# Patient Record
Sex: Male | Born: 1935 | Race: White | Hispanic: No | Marital: Married | Smoking: Never smoker
Health system: Southern US, Community
[De-identification: ages and names within clinical notes are randomized; demographics above are authoritative.]

## PROBLEM LIST (undated history)

## (undated) DIAGNOSIS — M810 Age-related osteoporosis without current pathological fracture: Secondary | ICD-10-CM

## (undated) DIAGNOSIS — D509 Iron deficiency anemia, unspecified: Secondary | ICD-10-CM

## (undated) DIAGNOSIS — H409 Unspecified glaucoma: Secondary | ICD-10-CM

## (undated) DIAGNOSIS — K219 Gastro-esophageal reflux disease without esophagitis: Secondary | ICD-10-CM

## (undated) DIAGNOSIS — R413 Other amnesia: Secondary | ICD-10-CM

## (undated) DIAGNOSIS — E785 Hyperlipidemia, unspecified: Secondary | ICD-10-CM

## (undated) DIAGNOSIS — I2510 Atherosclerotic heart disease of native coronary artery without angina pectoris: Secondary | ICD-10-CM

## (undated) HISTORY — DX: Unspecified glaucoma: H40.9

## (undated) HISTORY — DX: Atherosclerotic heart disease of native coronary artery without angina pectoris: I25.10

## (undated) HISTORY — DX: Age-related osteoporosis without current pathological fracture: M81.0

## (undated) HISTORY — PX: ANTERIOR CRUCIATE LIGAMENT REPAIR: SHX115

## (undated) HISTORY — DX: Other amnesia: R41.3

## (undated) HISTORY — DX: Iron deficiency anemia, unspecified: D50.9

## (undated) HISTORY — DX: Gastro-esophageal reflux disease without esophagitis: K21.9

## (undated) HISTORY — PX: OTHER SURGICAL HISTORY: SHX169

## (undated) HISTORY — DX: Hyperlipidemia, unspecified: E78.5

---

## 2001-02-27 ENCOUNTER — Encounter: Payer: Self-pay | Admitting: Emergency Medicine

## 2001-02-27 ENCOUNTER — Inpatient Hospital Stay (HOSPITAL_COMMUNITY): Admission: AC | Admit: 2001-02-27 | Discharge: 2001-02-28 | Payer: Self-pay

## 2001-02-28 ENCOUNTER — Encounter: Payer: Self-pay | Admitting: General Surgery

## 2004-08-08 ENCOUNTER — Emergency Department: Payer: Self-pay | Admitting: Emergency Medicine

## 2004-11-21 ENCOUNTER — Ambulatory Visit: Payer: Self-pay | Admitting: Internal Medicine

## 2004-12-03 ENCOUNTER — Ambulatory Visit: Payer: Self-pay | Admitting: Internal Medicine

## 2004-12-09 ENCOUNTER — Ambulatory Visit: Payer: Self-pay | Admitting: Cardiology

## 2005-01-02 ENCOUNTER — Ambulatory Visit: Payer: Self-pay | Admitting: Internal Medicine

## 2005-12-21 ENCOUNTER — Ambulatory Visit: Payer: Self-pay | Admitting: Cardiology

## 2006-01-30 ENCOUNTER — Ambulatory Visit: Payer: Self-pay

## 2006-12-06 ENCOUNTER — Ambulatory Visit: Payer: Self-pay | Admitting: Cardiology

## 2006-12-12 ENCOUNTER — Ambulatory Visit: Payer: Self-pay

## 2007-01-30 ENCOUNTER — Ambulatory Visit: Payer: Self-pay | Admitting: Gastroenterology

## 2007-05-30 ENCOUNTER — Ambulatory Visit: Payer: Self-pay | Admitting: Ophthalmology

## 2007-06-12 ENCOUNTER — Ambulatory Visit: Payer: Self-pay | Admitting: Ophthalmology

## 2007-10-02 ENCOUNTER — Ambulatory Visit: Payer: Self-pay | Admitting: Unknown Physician Specialty

## 2008-02-21 ENCOUNTER — Ambulatory Visit: Payer: Self-pay | Admitting: Cardiology

## 2008-03-23 ENCOUNTER — Ambulatory Visit: Payer: Self-pay | Admitting: Internal Medicine

## 2008-03-30 ENCOUNTER — Ambulatory Visit: Payer: Self-pay

## 2008-04-04 ENCOUNTER — Ambulatory Visit: Payer: Self-pay | Admitting: Internal Medicine

## 2008-06-04 ENCOUNTER — Ambulatory Visit: Payer: Self-pay | Admitting: Internal Medicine

## 2008-06-18 ENCOUNTER — Ambulatory Visit: Payer: Self-pay | Admitting: Unknown Physician Specialty

## 2008-06-25 ENCOUNTER — Inpatient Hospital Stay: Payer: Self-pay | Admitting: Unknown Physician Specialty

## 2009-03-04 ENCOUNTER — Encounter: Payer: Self-pay | Admitting: Internal Medicine

## 2009-03-25 ENCOUNTER — Ambulatory Visit: Payer: Self-pay | Admitting: Internal Medicine

## 2009-03-25 DIAGNOSIS — E785 Hyperlipidemia, unspecified: Secondary | ICD-10-CM | POA: Insufficient documentation

## 2009-03-25 DIAGNOSIS — I2510 Atherosclerotic heart disease of native coronary artery without angina pectoris: Secondary | ICD-10-CM

## 2009-03-25 DIAGNOSIS — R072 Precordial pain: Secondary | ICD-10-CM | POA: Insufficient documentation

## 2009-04-14 ENCOUNTER — Encounter: Payer: Self-pay | Admitting: Physician Assistant

## 2009-04-14 ENCOUNTER — Ambulatory Visit: Payer: Self-pay | Admitting: Internal Medicine

## 2009-09-16 ENCOUNTER — Ambulatory Visit: Payer: Self-pay | Admitting: Internal Medicine

## 2009-11-10 ENCOUNTER — Encounter: Payer: Self-pay | Admitting: Internal Medicine

## 2009-12-20 ENCOUNTER — Telehealth: Payer: Self-pay | Admitting: Internal Medicine

## 2010-04-11 ENCOUNTER — Ambulatory Visit: Payer: Self-pay | Admitting: Unknown Physician Specialty

## 2010-09-23 ENCOUNTER — Encounter: Payer: Self-pay | Admitting: Internal Medicine

## 2010-09-23 ENCOUNTER — Ambulatory Visit
Admission: RE | Admit: 2010-09-23 | Discharge: 2010-09-23 | Payer: Self-pay | Source: Home / Self Care | Attending: Internal Medicine | Admitting: Internal Medicine

## 2010-10-04 NOTE — Assessment & Plan Note (Signed)
Summary: F6M/AMD   Visit Type:  Follow-up Primary Provider:  Bethann Punches  CC:  no complaints and cough at bedtime.  History of Present Illness: [name] is a very pleasant 77 year old male with a 20-year history of diabetes as well as hyperlipidemia.  He had a cardiac catheterization in 2000, which showed moderate nonobstructive coronary disease with a 50% LAD lesion and a 60% lesion in his right coronary artery.  He has been managed medically.  His most recent Myoview was in April 2008 showed normal LV function, EF of 57% with no ischemia or infarction.   Was having some CP at the last visit and underwent ETT in 8/10. Walked 9:17 on [place] with no CP or ST-T changes.  Returns for f/u. Walking 2 miles every morning and swims half mile in p almost every day. Also doing some weights. Preparing for senior games in April. No CP or SOB. No problems with meds.  Lipids followed by Dr. Hyacinth Meeker.    Medications Prior to Update: 1)  Simvastatin 40 Mg Tabs (Simvastatin) .... Take One Tablet By Mouth Daily At Bedtime 2)  Ramipril 5 Mg Caps (Ramipril) .... Take One Capsule By Mouth Daily 3)  Glimepiride 1 Mg Tabs (Glimepiride) .... Take 1 By Mouth Once Daily 4)  Omeprazole 20 Mg Cpdr (Omeprazole) .... Take 1 By Mouth Once Daily 5)  Avodart 0.5 Mg Caps (Dutasteride) .... Take 1 By Mouth Once Daily 6)  Metformin Hcl 500 Mg Tabs (Metformin Hcl) .... Take 3 By Mouth Two Times A Day 7)  Aspirin 81 Mg Tbec (Aspirin) .... Take One Tablet By Mouth Daily 8)  Glucosamine Chondroitin Complx  Caps (Glucosamine-Chondroit-Biofl-Mn) .... Take 1 By Mouth Once Daily  Current Medications (verified): 1)  Simvastatin 40 Mg Tabs (Simvastatin) .... Take One Tablet By Mouth Daily At Bedtime 2)  Ramipril 5 Mg Caps (Ramipril) .... Take One Capsule By Mouth Daily 3)  Glimepiride 1 Mg Tabs (Glimepiride) .... Take 1 By Mouth Once Daily 4)  Omeprazole 20 Mg Cpdr (Omeprazole) .... Take 1 By Mouth Once Daily 5)  Metformin Hcl  500 Mg Tabs (Metformin Hcl) .... Take 3 By Mouth Two Times A Day 6)  Aspirin 81 Mg Tbec (Aspirin) .... Take One Tablet By Mouth Daily 7)  Glucosamine Chondroitin Complx  Caps (Glucosamine-Chondroit-Biofl-Mn) .... Take 1 By Mouth Once Daily 8)  Tamsulosin Hcl 0.4 Mg Caps (Tamsulosin Hcl) .[name].. 1 By Mouth Once Daily  Allergies (verified): No Known Drug Allergies  Past History:  Past Medical History: CAD, non-obsturctive by cath in 2000    --Myoview in 2008 negative. EF 57%    --ETT in 8/10. Walked 9:17 on [place] with no CP or ST-T changes. HYPERLIPIDEMIA DIABETES X 20 YEARS GERD IRON DEFICIENCY ANEMIA  Review of Systems       As per HPI and past medical history; otherwise all systems negative.   Vital Signs:  Patient profile:   77 year old male Height:      68 inches Weight:      178.50 pounds BMI:     27.24 Pulse rate:   70 / minute Pulse rhythm:   regular BP sitting:   118 / 62  (left arm) Cuff size:   regular  Vitals Entered By: Mercer Pod (September 16, 2009 4:20 PM)  Physical Exam  General:  Gen: well appearing. no resp difficulty. younger than stated age HEENT: normal Neck: supple. no JVD. Carotids 2+ bilat; not bruits. No lymphadenopathy or thryomegaly appreciated. Cor: PMI  nondisplaced. Regular rate & rhythm. No rubs, murmur. +s4 Lungs: clear Abdomen: soft, nontender, nondistended. No hepatosplenomegaly. Good bowel sounds. Extremities: no cyanosis, clubbing, rash, edema Neuro: alert & orientedx3, cranial nerves grossly intact. moves all 4 extremities w/o difficulty. affect pleasant    Impression & Recommendations:  Problem # 1:  CAD, NATIVE VESSEL (ICD-414.01) Stable. No evidence of ischemia. Stress test reassuring. Continue current regimen.  Problem # 2:  HYPERLIPIDEMIA-MIXED (ICD-272.4) Folloed by Dr. Hyacinth Meeker. Goal LDL < 70. Continue statin.   Patient Instructions: 1)  Return to clinic 1 year.

## 2010-10-04 NOTE — Progress Notes (Signed)
Summary: TRIAL MEDS  Phone Note Call from Patient Call back at Home Phone [phone]   Caller: SELF Call For: [name] [name] Summary of Call: DR MARK MILLER  WANTS TO START PT ON A TRIAL FOR DIABETES WHICH WILL INCLUDE LIRAGLUTIDE OR PLACEBO-WOULD IT BE OK FOR HIM TAKE THESE MEDS THROUGH A SHOT ONCE A WEEK? Initial call taken by: Harlon Flor,  December 20, 2009 8:33 AM  Follow-up for Phone Call        I'm ok with it. Dolores Patty, MD, [facility]  December 20, 2009 4:31 PM   Additional Follow-up for Phone Call Additional follow up Details #1::        pt aware. Charlena Cross RN BSN

## 2010-10-12 NOTE — Assessment & Plan Note (Signed)
Summary: F1Y/AMD   Visit Type:  Follow-up Primary Provider:  Bethann [name]  CC:  "Doing great".  History of Present Illness: [name] [name] is a very pleasant 77 year old male with a 20-year history of diabetes as well as hyperlipidemia.  He had a cardiac catheterization in 2000, which showed moderate nonobstructive coronary disease with a 50% LAD lesion and a 60% lesion in his right coronary artery.  He has been managed medically.  His most recent Myoview was in April 2008 showed normal LV function, EF of 57% with no ischemia or infarction.   Underwent ETT in 8/10 for CP. Walked 9:17 on [place] with no CP or ST-T changes.  Returns for 1 year. Feels great  Walking 2-3 miles every morning and swims half mile almost every day. Also doing some weights. Won 5 swimmming events in senior games in last year. No CP or SOB. No problems with meds.   Lipids and diabetes followed by Dr. Hyacinth Meeker. HgbA1c  ~7.    Current Medications (verified): 1)  Simvastatin 40 Mg Tabs (Simvastatin) .... Take One Tablet By Mouth Daily At Bedtime 2)  Ramipril 5 Mg Caps (Ramipril) .... Take One Capsule By Mouth Daily 3)  Glimepiride 1 Mg Tabs (Glimepiride) .... Take 1 By Mouth Once Daily 4)  Omeprazole 20 Mg Cpdr (Omeprazole) .... Take 1 By Mouth Once Daily 5)  Metformin Hcl 500 Mg Tabs (Metformin Hcl) .... Take 3 By Mouth Two Times A Day 6)  Aspirin 81 Mg Tbec (Aspirin) .... Take One Tablet By Mouth Daily 7)  Glucosamine Chondroitin Complx  Caps (Glucosamine-Chondroit-Biofl-Mn) .... Take 1 By Mouth Once Daily 8)  Tamsulosin Hcl 0.4 Mg Caps (Tamsulosin Hcl) .[name].. 1 By Mouth Once Daily  Allergies (verified): No Known Drug Allergies  Past History:  Past Medical History: Last updated: 09/16/2009 CAD, non-obsturctive by cath in 2000    --Myoview in 2008 negative. EF 57%    --ETT in 8/10. Walked 9:17 on [place] with no CP or ST-T changes. HYPERLIPIDEMIA DIABETES X 20 YEARS GERD IRON DEFICIENCY ANEMIA  Past Surgical  History: Last updated: 07-Apr-2009 REMOVAL R EYE R KNEE SURGERY COLON POLYPS REMOVED X 2 CATH   Family History: Last updated: Apr 07, 2009 FATHER: DECEASED 87; HEART ATTACK MOTHER: DECEASED 91; STROKE MGM: DECEASED 82; OLD AGE MGF: DECEASED PGF: DECEASED 55; HEART ATTACK PGM: DECEASED  BROTHER: AGE 107; HEART DISEASE BYPASS SISTER: LIVING; ARTHRITIS  Social History: Last updated: 04/07/2009 Retired  Married  Tobacco Use - No.  Alcohol Use - yes  Risk Factors: Smoking Status: never (04-07-09)  Review of Systems       As per HPI and past medical history; otherwise all systems negative.   Vital Signs:  Patient profile:   77 year old male Height:      68 inches Weight:      173 pounds BMI:     26.40 Pulse rate:   65 / minute BP sitting:   120 / 70  (left arm) Cuff size:   regular  Vitals Entered By: [name] [name], CMA (September 23, 2010 4:04 PM)  Physical Exam  General:  well appearing. no resp difficulty. younger than stated age HEENT: normal Neck: supple. no JVD. Carotids 2+ bilat; not bruits. No lymphadenopathy or thryomegaly appreciated. Cor: PMI nondisplaced. Regular rate & rhythm. No rubs, murmur. +s4 Lungs: clear Abdomen: soft, nontender, nondistended. No hepatosplenomegaly. Good bowel sounds. Extremities: no cyanosis, clubbing, rash, edema Neuro: alert & orientedx3, cranial nerves grossly intact. moves all 4 extremities w/o difficulty.  affect pleasant    Impression & Recommendations:  Problem # 1:  CAD, NATIVE VESSEL (ICD-414.01) Mild non-obstructive. No evidence of angina. Continue risk factor management.

## 2011-01-17 NOTE — Assessment & Plan Note (Signed)
[facility] OFFICE NOTE   NAME:[name] [name]                        MRN:          [id number]  DATE:06/04/2008                            DOB:          01/02/1934    PRIMARY CARE PHYSICIAN:  Dr. Bethann Punches.   ORTHOPEDIST:  Randon Goldsmith, MD   REASON FOR CONSULTATION:  Preop cardiac evaluation prior to partial  right knee replacement.   HISTORY OF PRESENT ILLNESS:  [name] is a very pleasant 77 year old  male with a 20-year history of diabetes as well as hyperlipidemia.  He  had a cardiac catheterization in 2000, which showed moderate  nonobstructive coronary disease with a 50% LAD lesion and a 60% lesion  in his right coronary artery.  He has been managed medically.  His most  recent Myoview was in April 2008 showed normal LV function, EF of 57%  with no ischemia or infarction.   Functionally, he has been very active.  He goes to senior games and  participates in about 15 events including swimming the 5K and a host of  track and field events.  He has never had any chest pain or significant  dyspnea with this.  He has been swimming half a mile a day and walking 3  miles a day in training.  Unfortunately, at the [facility] games about 3  weeks ago, he hurt his right knee.  He went to see Dr. Gavin Potters and told  he may need a partial right knee replacement.  He comes here for preop  evaluation.   REVIEW OF SYSTEMS:  He denies any chest pain, no dizziness, no  palpitations, no heart failure symptoms.  He does have some arthritic  pain.  He has not had any bleeding.  Remainder of review of systems is  negative except for HPI and problem list.   PROBLEM LIST:  1. Coronary artery disease, nonobstructive by catheterization in 2000.  2. Hyperlipidemia.  3. Diabetes x20 years.  4. Gastroesophageal reflux disease.  5. Iron deficiency anemia.   CURRENT MEDICATIONS:  1. Aspirin 81 a day.  2. Avodart 0.5 a  week.  3. Simvastatin 40 a day.  4. Altace 5 a day.  5. Omeprazole 20 a day.  6. Glucosamine/chondroitin.  7. Metformin 1500 mg b.i.d.   ALLERGIES:  No known allergies.   SOCIAL HISTORY:  He is married.  He has never smoked.  He is retired.  Occasional wine.   FAMILY HISTORY:  Negative for coronary artery disease.   PHYSICAL EXAMINATION:  GENERAL:  He is in no acute distress.  Ambulates  around the clinic without any respiratory difficulty.  He does have  significant kyphosis.  VITAL SIGNS:  Blood pressure is 130/60, heart rate 60, weight is 164.  HEENT:  Normal.  NECK:  Supple.  There is no JVD.  Carotids are 2+ bilaterally without  any bruits.  There is no lymphadenopathy or thyromegaly.  HEART:  PMI is nondisplaced.  Regular rate and rhythm.  No murmurs,  rubs, or gallops.  LUNGS:  Clear.  ABDOMEN:  Soft, nontender, nondistended.  No hepatosplenomegaly.  No  bruits.  No masses.  Good bowel sounds.  EXTREMITIES:  Warm with no cyanosis, clubbing, or edema.  He does have  mild effusion around his right knee.  NEUROLOGIC:  Alert and oriented x3.  Cranial nerves II through XII are  intact.  Moves all 4 extremities without difficulty.  Affect is  pleasant.   EKG shows sinus bradycardia at a rate of 56.  No ST-T wave  abnormalities.   ASSESSMENT AND PLAN:  Given Mr. Top's excellent functional capacity  and normal Myoview, he is at very low-risk for perioperative cardiac  complications.  I would suggest proceeding to surgery without any  further cardiac workup.  We will be happy to assist in case there are  any unexpected perioperative cardiac issues.  We appreciate the consult.  Please do not hesitate to call with questions.     Bevelyn Buckles. Bensimhon, MD  Electronically Signed    DRB/MedQ  DD: 06/04/2008  DT: 06/05/2008  Job #: [id number]   cc:   Kelvin Cellar, MD

## 2011-01-17 NOTE — Assessment & Plan Note (Signed)
[facility] OFFICE NOTE   NAME:Ziegler, MOHSIN CRUM                        MRN:          [id number]  DATE:02/21/2008                            DOB:          1934-08-09    Mr. Calleros is a very pleasant gentleman who has a history of moderate  coronary disease by previous catheterization.  This was performed in  2000, and he was found to have 50% LAD as well as 60% right coronary  artery.  He has been managed medically since then.  His most recent  Myoview was performed on December 12, 2006, and showed normal LV function  with an ejection fraction of 57%, and there was no ischemia or  infarction.  His last echo in June 2003, showed normal LV function and  mild tricuspid regurgitation.  He has also had a previous abdominal  ultrasound that showed no aneurysm in May 2007 and carotid Dopplers in  May 2007, that showed no obstructive disease.  Since I last saw him, he  denies any dyspnea.  Approximately 1-2 weeks ago, he had a brief episode  of chest pain that lasted for 30 seconds.  It did not radiate.  It was  not pleuritic or positional.  He thinks it may have been gas.  He  described it as a sharp pain.  Note, he swims and exercises routinely  without any chest pain.   MEDICATIONS:  1. Aspirin 81 mg p.o. daily.  2. Avodart.  3. Zocor 40 mg p.o. daily.  4. Altace 5 mg p.o. daily.  5. Omeprazole 20 mg p.o. daily.  6. Glucosamine and chondroitin.  7. Metformin 1500 mg p.o. b.i.d.   PHYSICAL EXAMINATION:  VITAL SIGNS:  Today, shows a blood pressure  126/70, his pulse is 71.  HEENT:  Normal.  NECK:  Supple.  No bruits.  CHEST:  Clear.  CARDIOVASCULAR:  Regular rate and rhythm.  ABDOMEN:  No tenderness.  EXTREMITIES:  No edema.   His electrocardiogram shows a sinus rhythm at a rate of 71.  There are  no ST changes noted.   DIAGNOSES:  1. Recent atypical chest pain - His symptoms do not sound cardiac.      His recent  Myoview was normal.  We do not pursue further ischemia      evaluation.  2. History of moderate coronary disease.  He will continue on his      aspirin, statin, and ACE inhibitor.  His recent Myoview showed no      ischemia.  He will continue with risk factor modification including      diet and exercise.  He does not smoke.  3. Diabetes mellitus - Management per Dr. Hyacinth Meeker.  4. Hyperlipidemia - He will continue on his statin, and we will have      his most recent lipids and liver forwarded to [place] for our records.      A goal LDL should be less than 70 given his history of coronary      disease.  5. History of gastroesophageal reflux disease.  We will see him back in [place] in 1 year.     Madolyn Frieze Jens Som, MD, [facility]  Electronically Signed    BSC/MedQ  DD: 02/21/2008  DT: 02/21/2008  Job #: [id number]   cc:   Bethann Punches
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
## 2011-01-20 NOTE — Assessment & Plan Note (Signed)
[facility]                            CARDIOLOGY OFFICE NOTE   NAME:[name] [name] MONTEFORTE                        MRN:          [id number]  DATE:12/06/2006                            DOB:          Dec 05, 1933    [name] [name] is a pleasant gentleman who has a history of moderate coronary  disease by previous catheterization with his most recent nuclear study  performed in January 2005.  At that time, his ejection fraction was 58%  with mild inferobasal thinning and apical thinning but no ischemia.  Since I last saw him he is doing well.  He is exercising, routinely  walking, and also playing softball.  He does not have exertional chest  pain.  There is no significant dyspnea on exertion, orthopnea, PND,  pedal edema,  palpitations, presyncope, syncope or claudication.   His medications at present include:  1. Aspirin 81 mg p.o. daily.  2. Saw [place]met[place].  3. Metformin 1000 mg p.o. b.i.d.  4. Flomax 4 mg p.o. daily.  5. Avodart 0.5 mg p.o. daily.  6. Zocor 40 mg p.o. daily.  7. Altace 5 mg p.o. daily.  8,  Omeprazole.   His physical exam today shows a blood pressure of 118/60, his pulse is  59.  Weight is 170 pounds.  NECK:  Is supple.  CHEST:  Is clear.  CARDIOVASCULAR:  Regular rhythm.  EXTREMITIES:  Show no edema.   Electrocardiogram shows a sinus rhythm at a rate of 59.  There are no ST  changes noted.   DIAGNOSIS:  1. Moderate coronary disease. Patient will continue with risk factor      modification including diet and exercise.  Note, he does not smoke.      We will plan to proceed with a stress Myoview for risk      stratification given that it has been 3 years and he has multiple      other risk factors.  If it shows normal perfusion, then we will      continue with medical therapy.  2. Diabetes mellitus managed by Dr. Hyacinth [name].  3. Hyperlipidemia.  We will have his most recent lipids forwarded to      [place] for our records from Dr. Rondel [name] office.   Our goal LDL should      be less than 70 given his history of coronary disease.  4. History of gastroesophageal reflux disease.   PLAN:  Continue omeprazole and this is also managed by Dr. Hyacinth [name].  He  will see [place] back in 1 year.     Madolyn Frieze Jens Som, MD, [facility]  Electronically Signed    BSC/MedQ  DD: 12/06/2006  DT: 12/06/2006  Job #: [id number]   cc:   [name] [name]
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
## 2011-02-21 ENCOUNTER — Encounter: Payer: Self-pay | Admitting: Internal Medicine

## 2011-09-21 ENCOUNTER — Ambulatory Visit (INDEPENDENT_AMBULATORY_CARE_PROVIDER_SITE_OTHER): Payer: Medicare HMO | Admitting: Cardiovascular Disease

## 2011-09-21 ENCOUNTER — Encounter: Payer: Self-pay | Admitting: Cardiovascular Disease

## 2011-09-21 VITALS — BP 146/82 | HR 59 | Ht 73.0 in | Wt 178.0 lb

## 2011-09-21 DIAGNOSIS — E785 Hyperlipidemia, unspecified: Secondary | ICD-10-CM

## 2011-09-21 DIAGNOSIS — I2510 Atherosclerotic heart disease of native coronary artery without angina pectoris: Secondary | ICD-10-CM

## 2011-09-21 DIAGNOSIS — E119 Type 2 diabetes mellitus without complications: Secondary | ICD-10-CM | POA: Insufficient documentation

## 2011-09-21 NOTE — Assessment & Plan Note (Signed)
Currently with no symptoms of angina. No further workup at this time. Continue current medication regimen. 

## 2011-09-21 NOTE — Patient Instructions (Signed)
You are doing well. No medication changes were made.  Please call us if you have new issues that need to be addressed before your next appt.  Your physician wants you to follow-up in: 12 months.  You will receive a reminder letter in the mail two months in advance. If you don't receive a letter, please call our office to schedule the follow-up appointment. 

## 2011-09-21 NOTE — Assessment & Plan Note (Signed)
We have encouraged continued exercise, careful diet management in an effort to lose weight. 

## 2011-09-21 NOTE — Progress Notes (Signed)
Patient ID: [name] [name], male    DOB: 04/20/1934, 78 y.o.   MRN: [id number]  HPI Comments: Mr. Matters is a very pleasant 78 year old male with a 20-year history of diabetes as well as hyperlipidemia, cardiac catheterization in 2000, which showed moderate nonobstructive coronary disease with a 50% LAD lesion and a 60% lesion in his right coronary artery.  He has been managed medically.  His most recent Myoview was in April 2008 showed normal LV function, EF of 57% with no ischemia or infarction.  He presents for routine followup.   Was having some CP Several years ago and underwent ETT in 8/10. Walked 9:17 on [place] with no CP or ST-T changes.    Walking 2 miles every morning and swims half mile in p almost every day. Also doing some weights. No CP or SOB. No problems with meds.   EKG shows normal sinus rhythm with rate 59 beats per minute with no significant ST or T wave changes        Outpatient Encounter Prescriptions as of 09/21/2011  Medication Sig Dispense Refill  . aspirin 81 MG tablet Take 81 mg by mouth daily.        [name] glimepiride (AMARYL) 4 MG tablet Take 4 mg by mouth daily before breakfast.      . Glucosamine-Chondroitin 500-400 MG CAPS Take by mouth 2 (two) times daily. Take 3 tabs       . metFORMIN (GLUCOPHAGE) 1000 MG tablet Take 1,000 mg by mouth 2 (two) times daily with a meal.      . omeprazole (PRILOSEC) 20 MG capsule Take 20 mg by mouth daily.        . ramipril (ALTACE) 5 MG capsule Take 5 mg by mouth daily.        . simvastatin (ZOCOR) 40 MG tablet Take 40 mg by mouth at bedtime.        . sitaGLIPtin (JANUVIA) 50 MG tablet Take 50 mg by mouth daily.      . Tamsulosin HCl (FLOMAX) 0.4 MG CAPS Take by mouth at bedtime.        [name] DISCONTD: glimepiride (AMARYL) 1 MG tablet Take 4 mg by mouth daily.          Review of Systems  Constitutional: Negative.   HENT: Negative.   Eyes: Negative.   Respiratory: Negative.   Cardiovascular: Negative.   Gastrointestinal:  Negative.   Musculoskeletal: Negative.   Skin: Negative.   Neurological: Negative.   Hematological: Negative.   Psychiatric/Behavioral: Negative.   All other systems reviewed and are negative.    BP 146/82  Pulse 59  Ht 6\' 1"  (1.854 m)  Wt 178 lb (80.74 kg)  BMI 23.48 kg/m2  Physical Exam  Nursing note and vitals reviewed. Constitutional: He is oriented to person, place, and time. He appears well-developed and well-nourished.  HENT:  Head: Normocephalic.  Nose: Nose normal.  Mouth/Throat: Oropharynx is clear and moist.  Eyes: Conjunctivae are normal. Pupils are equal, round, and reactive to light.  Neck: Normal range of motion. Neck supple. No JVD present.  Cardiovascular: Normal rate, regular rhythm, S1 normal, S2 normal, normal heart sounds and intact distal pulses.  Exam reveals no gallop and no friction rub.   No murmur heard. Pulmonary/Chest: Effort normal and breath sounds normal. No respiratory distress. He has no wheezes. He has no rales. He exhibits no tenderness.  Abdominal: Soft. Bowel sounds are normal. He exhibits no distension. There is no tenderness.  Musculoskeletal: Normal  range of motion. He exhibits no edema and no tenderness.  Lymphadenopathy:    He has no cervical adenopathy.  Neurological: He is alert and oriented to person, place, and time. Coordination normal.  Skin: Skin is warm and dry. No rash noted. No erythema.  Psychiatric: He has a normal mood and affect. His behavior is normal. Judgment and thought content normal.           Assessment and Plan

## 2011-09-21 NOTE — Assessment & Plan Note (Signed)
Cholesterol is at goal on the current lipid regimen. No changes to the medications were made.  

## 2011-10-16 ENCOUNTER — Telehealth: Payer: Self-pay

## 2011-10-16 ENCOUNTER — Emergency Department: Payer: Self-pay | Admitting: *Deleted

## 2011-10-16 LAB — CK TOTAL AND CKMB (NOT AT ARMC)
CK, Total: 59 U/L (ref 35–232)
CK-MB: 1.1 ng/mL (ref 0.5–3.6)

## 2011-10-16 LAB — CBC
HCT: 39.9 % — ABNORMAL LOW (ref 40.0–52.0)
MCV: 83 fL (ref 80–100)
Platelet: 192 10*3/uL (ref 150–440)
RBC: 4.8 10*6/uL (ref 4.40–5.90)
RDW: 14 % (ref 11.5–14.5)
WBC: 6.5 10*3/uL (ref 3.8–10.6)

## 2011-10-16 LAB — COMPREHENSIVE METABOLIC PANEL
Albumin: 4.1 g/dL (ref 3.4–5.0)
Anion Gap: 8 (ref 7–16)
Bilirubin,Total: 0.4 mg/dL (ref 0.2–1.0)
Calcium, Total: 9.9 mg/dL (ref 8.5–10.1)
Co2: 29 mmol/L (ref 21–32)
Creatinine: 0.73 mg/dL (ref 0.60–1.30)
Glucose: 90 mg/dL (ref 65–99)
Osmolality: 281 (ref 275–301)
Potassium: 4.2 mmol/L (ref 3.5–5.1)
SGPT (ALT): 23 U/L
Sodium: 141 mmol/L (ref 136–145)
Total Protein: 7.8 g/dL (ref 6.4–8.2)

## 2011-10-16 LAB — TROPONIN I: Troponin-I: <0.02 ng/mL

## 2011-10-16 NOTE — Telephone Encounter (Signed)
See Telephone not from 10/18/2011. The patient states, "he will not go to acute care downstairs and to just forget it."

## 2011-10-16 NOTE — Telephone Encounter (Signed)
Patient came into clinic today wanting to be seen for chest tightness.  He started having chest tightness last night but the tightness went away.  The patient was on his way to a meeting and felt the chest tightness coming on again and thought he would just stop by the office today to be seen. Told the patient would need to go downstairs to acute care here in our building to get an EKG and to be evaluated to see if symptoms are cardiac related and if so, acute care would contact us immediately.

## 2011-10-17 LAB — TROPONIN I: Troponin-I: <0.02 ng/mL

## 2012-01-31 ENCOUNTER — Ambulatory Visit: Payer: Self-pay | Admitting: Gastroenterology

## 2012-09-23 ENCOUNTER — Ambulatory Visit: Payer: Medicare HMO | Admitting: Cardiovascular Disease

## 2012-10-02 ENCOUNTER — Ambulatory Visit: Payer: Medicare HMO | Admitting: Cardiovascular Disease

## 2012-10-10 ENCOUNTER — Encounter: Payer: Self-pay | Admitting: Cardiovascular Disease

## 2012-10-10 ENCOUNTER — Ambulatory Visit (INDEPENDENT_AMBULATORY_CARE_PROVIDER_SITE_OTHER): Payer: Medicare Other | Admitting: Cardiovascular Disease

## 2012-10-10 VITALS — BP 150/70 | HR 91 | Ht 72.0 in | Wt 178.2 lb

## 2012-10-10 DIAGNOSIS — R079 Chest pain, unspecified: Secondary | ICD-10-CM

## 2012-10-10 DIAGNOSIS — I2510 Atherosclerotic heart disease of native coronary artery without angina pectoris: Secondary | ICD-10-CM

## 2012-10-10 DIAGNOSIS — E119 Type 2 diabetes mellitus without complications: Secondary | ICD-10-CM

## 2012-10-10 DIAGNOSIS — E785 Hyperlipidemia, unspecified: Secondary | ICD-10-CM

## 2012-10-10 DIAGNOSIS — R072 Precordial pain: Secondary | ICD-10-CM

## 2012-10-10 MED ORDER — NITROGLYCERIN 0.4 MG SL SUBL: 0.4000 mg | SUBLINGUAL_TABLET | SUBLINGUAL | Status: DC | PRN

## 2012-10-10 NOTE — Assessment & Plan Note (Signed)
We have encouraged continued exercise, careful diet management in an effort to lose weight. 

## 2012-10-10 NOTE — Assessment & Plan Note (Signed)
Cholesterol is at goal on the current lipid regimen. No changes to the medications were made.  

## 2012-10-10 NOTE — Assessment & Plan Note (Signed)
1 brief episode of chest pain in December, no further episodes. I suggested he contact our office for further episodes. We have renewed his  nitroglycerin.

## 2012-10-10 NOTE — Progress Notes (Signed)
Patient ID: [name] [name], male    DOB: [birth year], 79 y.o.   MRN: [id number]  HPI Comments: [name] [name] is a 79 year old male with a >20-year history of diabetes as well as hyperlipidemia, cardiac catheterization in 2000, which showed moderate nonobstructive coronary disease with a 50% LAD lesion and a 60% lesion in his right coronary artery.  He has been managed medically.  His most recent Myoview was in April 2008 showed normal LV function, EF of 57% with no ischemia or infarction.  He presents for routine followup.    CP Several years ago and underwent ETT in 8/10. Walked 9:17 on [place] with no CP or ST-T changes.  He reports having episode of chest pain in the middle of December lasting for 1 minute. He presented to the office lobby expecting to be seen and no physician was available at that time. He left and symptoms resolved. No further recurrence of his chest pain. He is active, exercises on a regular basis, walks several miles every morning, swims daily, also uses weights. No symptoms of chest pain or shortness of breath with exercise.  He reports his hemoglobin A1c has been elevated at more than 7. He is trying to do better Total cholesterol 122, LDL 70   EKG shows normal sinus rhythm with rate 91 beats per minute with no significant ST or T wave changes        Outpatient Encounter Prescriptions as of 10/10/2012  Medication Sig Dispense Refill  . aspirin 81 MG tablet Take 81 mg by mouth daily.        [name] galantamine (RAZADYNE) 4 MG tablet Take 4 mg by mouth 2 (two) times daily.      [name] glimepiride (AMARYL) 4 MG tablet Take 4 mg by mouth daily before breakfast.      . Glucosamine-Chondroitin 500-400 MG CAPS Take by mouth 2 (two) times daily. Take 3 tabs       . metFORMIN (GLUCOPHAGE) 1000 MG tablet Take 1,000 mg by mouth 2 (two) times daily with a meal.      . omeprazole (PRILOSEC) 20 MG capsule Take 20 mg by mouth daily.        . pioglitazone (ACTOS) 30 MG tablet Take 30 mg by mouth  daily.      . simvastatin (ZOCOR) 40 MG tablet Take 40 mg by mouth at bedtime.        . sitaGLIPtin (JANUVIA) 50 MG tablet Take 50 mg by mouth daily.      . Tamsulosin HCl (FLOMAX) 0.4 MG CAPS Take by mouth at bedtime.        . nitroGLYCERIN (NITROSTAT) 0.4 MG SL tablet Place 1 tablet (0.4 mg total) under the tongue every 5 (five) minutes as needed for chest pain.  25 tablet  6  . [DISCONTINUED] ramipril (ALTACE) 5 MG capsule Take 5 mg by mouth daily.           Review of Systems  Constitutional: Negative.   HENT: Negative.   Eyes: Negative.   Respiratory: Negative.   Cardiovascular: Negative.   Gastrointestinal: Negative.   Musculoskeletal: Negative.   Skin: Negative.   Neurological: Negative.   Hematological: Negative.   Psychiatric/Behavioral: Negative.   All other systems reviewed and are negative.    BP 150/70  Pulse 91  Ht 6' (1.829 m)  Wt 178 lb 4 oz (80.854 kg)  BMI 24.18 kg/m2  Physical Exam  Nursing note and vitals reviewed. Constitutional: He is oriented to person, place,  and time. He appears well-developed and well-nourished.  HENT:  Head: Normocephalic.  Nose: Nose normal.  Mouth/Throat: Oropharynx is clear and moist.  Eyes: Conjunctivae normal are normal. Pupils are equal, round, and reactive to light.  Neck: Normal range of motion. Neck supple. No JVD present.  Cardiovascular: Normal rate, regular rhythm, S1 normal, S2 normal, normal heart sounds and intact distal pulses.  Exam reveals no gallop and no friction rub.   No murmur heard. Pulmonary/Chest: Effort normal and breath sounds normal. No respiratory distress. He has no wheezes. He has no rales. He exhibits no tenderness.  Abdominal: Soft. Bowel sounds are normal. He exhibits no distension. There is no tenderness.  Musculoskeletal: Normal range of motion. He exhibits no edema and no tenderness.  Lymphadenopathy:    He has no cervical adenopathy.  Neurological: He is alert and oriented to person,  place, and time. Coordination normal.  Skin: Skin is warm and dry. No rash noted. No erythema.  Psychiatric: He has a normal mood and affect. His behavior is normal. Judgment and thought content normal.           Assessment and Plan

## 2012-10-10 NOTE — Patient Instructions (Addendum)
You are doing well. No medication changes were made.  Check your blood pressure at home Call the office for blood pressure greater than 135 to 140 on a regular basis, more than 90 on the bottom number.   Please call us if you have new issues that need to be addressed before your next appt.  Your physician wants you to follow-up in: 12 months.  You will receive a reminder letter in the mail two months in advance. If you don't receive a letter, please call our office to schedule the follow-up appointment.

## 2012-10-10 NOTE — Assessment & Plan Note (Signed)
Currently with no symptoms of angina. No further workup at this time. Continue current medication regimen. 

## 2013-10-15 ENCOUNTER — Telehealth: Payer: Self-pay

## 2013-10-15 ENCOUNTER — Ambulatory Visit: Payer: Medicare Other | Admitting: Cardiovascular Disease

## 2013-10-15 ENCOUNTER — Encounter: Payer: Self-pay | Admitting: Cardiovascular Disease

## 2013-10-15 ENCOUNTER — Ambulatory Visit (INDEPENDENT_AMBULATORY_CARE_PROVIDER_SITE_OTHER): Payer: Medicare Other | Admitting: Cardiovascular Disease

## 2013-10-15 VITALS — BP 138/70 | HR 57 | Wt 180.8 lb

## 2013-10-15 DIAGNOSIS — R072 Precordial pain: Secondary | ICD-10-CM

## 2013-10-15 DIAGNOSIS — E785 Hyperlipidemia, unspecified: Secondary | ICD-10-CM

## 2013-10-15 DIAGNOSIS — I2510 Atherosclerotic heart disease of native coronary artery without angina pectoris: Secondary | ICD-10-CM

## 2013-10-15 DIAGNOSIS — E119 Type 2 diabetes mellitus without complications: Secondary | ICD-10-CM

## 2013-10-15 NOTE — Patient Instructions (Signed)
Your physician recommends that you continue on your current medications as directed. Please refer to the Current Medication list given to you today.  Your physician wants you to follow-up in: 1 year. You will receive a reminder letter in the mail two months in advance. If you don't receive a letter, please call our office to schedule the follow-up appointment.  

## 2013-10-15 NOTE — Telephone Encounter (Signed)
Pt wife called and states pt no longer takes Januvia and requests it be taken off his list. States his Glimepiride he takes 1 1/2 a day 1/2 at night and 1 full in the morning. Requests this be noted on his med list. 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 Please call in needed.

## 2013-10-15 NOTE — Assessment & Plan Note (Signed)
Cholesterol is at goal on the current lipid regimen. No changes to the medications were made.  

## 2013-10-15 NOTE — Assessment & Plan Note (Signed)
We have encouraged continued exercise, careful diet management in an effort to lose weight. 

## 2013-10-15 NOTE — Progress Notes (Signed)
Patient ID: [place] Falls[place] J All[place], male    DOB: 02/27/1934, 80 y.o.   MRN: [id number]  HPI Comments: [name] is a 80 year old male with a >20-year history of diabetes as well as hyperlipidemia, cardiac catheterization in 2000, which showed moderate nonobstructive coronary disease with a 50% LAD lesion and a 60% lesion in his right coronary artery.  He has been managed medically.   He presents for routine followup.   Myoview  April 2008 showed normal LV function, EF of 57% with no ischemia or infarction.     CP Several years ago and underwent ETT in 8/10. Walked 9:17 on [place] with no CP or ST-T changes.  In followup today, he reports no significant change in his cardiac status over the past year. He does report that he is active, swims, walks his dog on a regular basis with no symptoms. Occasional atypical chest pain, reproducible with palpation on the left which she attributes to gas, GI issues.  Hemoglobin A1c typically 7 or more. Most recent lab work September 2014 shows hemoglobin A1c 8.2. Lipids have been well controlled in the past Total cholesterol 112, LDL 62   EKG shows normal sinus rhythm with rate 57 beats per minute with no significant ST or T wave changes        Outpatient Encounter Prescriptions as of 10/15/2013  Medication Sig  . aspirin 81 MG tablet Take 81 mg by mouth daily.    [name]. galantamine (RAZADYNE) 4 MG tablet Take 4 mg by mouth 2 (two) times daily.  [name]. glimepiride (AMARYL) 4 MG tablet Take 4 mg by mouth daily before breakfast.  . Glucosamine-Chondroitin 500-400 MG CAPS Take by mouth 2 (two) times daily. Take 3 tabs   . metFORMIN (GLUCOPHAGE) 1000 MG tablet Take 1,000 mg by mouth 2 (two) times daily with a meal.  . nitroGLYCERIN (NITROSTAT) 0.4 MG SL tablet Place 1 tablet (0.4 mg total) under the tongue every 5 (five) minutes as needed for chest pain.  [name]. omeprazole (PRILOSEC) 20 MG capsule Take 20 mg by mouth daily.    . pioglitazone (ACTOS) 30 MG tablet Take 30 mg by mouth  daily.  . simvastatin (ZOCOR) 40 MG tablet Take 40 mg by mouth at bedtime.    . sitaGLIPtin (JANUVIA) 50 MG tablet Take 50 mg by mouth daily.  . Tamsulosin HCl (FLOMAX) 0.4 MG CAPS Take by mouth at bedtime.       Review of Systems  Constitutional: Negative.   HENT: Negative.   Eyes: Negative.   Respiratory: Negative.   Cardiovascular: Negative.   Gastrointestinal: Negative.   Endocrine: Negative.   Musculoskeletal: Negative.   Skin: Negative.   Allergic/Immunologic: Negative.   Neurological: Negative.   Hematological: Negative.   Psychiatric/Behavioral: Negative.   All other systems reviewed and are negative.   BP 138/70  Pulse 57  Wt 180 lb 12.8 oz (82.01 kg) he 6 feet tall  Physical Exam  Nursing note and vitals reviewed. Constitutional: He is oriented to person, place, and time. He appears well-developed and well-nourished.  HENT:  Head: Normocephalic.  Nose: Nose normal.  Mouth/Throat: Oropharynx is clear and moist.  Eyes: Conjunctivae are normal. Pupils are equal, round, and reactive to light.  Neck: Normal range of motion. Neck supple. No JVD present.  Cardiovascular: Normal rate, regular rhythm, S1 normal, S2 normal, normal heart sounds and intact distal pulses.  Exam reveals no gallop and no friction rub.   No murmur heard. Pulmonary/Chest: Effort normal and breath sounds  normal. No respiratory distress. He has no wheezes. He has no rales. He exhibits no tenderness.  Abdominal: Soft. Bowel sounds are normal. He exhibits no distension. There is no tenderness.  Musculoskeletal: Normal range of motion. He exhibits no edema and no tenderness.  Lymphadenopathy:    He has no cervical adenopathy.  Neurological: He is alert and oriented to person, place, and time. Coordination normal.  Skin: Skin is warm and dry. No rash noted. No erythema.  Psychiatric: He has a normal mood and affect. His behavior is normal. Judgment and thought content normal.      Assessment and  Plan

## 2013-10-15 NOTE — Assessment & Plan Note (Signed)
Occasional episodes of left side chest wall pain. Atypical in nature

## 2013-10-15 NOTE — Assessment & Plan Note (Signed)
Currently with no symptoms of angina. No further workup at this time. Continue current medication regimen. 

## 2013-10-16 NOTE — Addendum Note (Signed)
Addended by: Festus AloeRESPO, Jerolyn Flenniken G on: 10/16/2013 11:37 AM   Modules accepted: Orders

## 2013-10-16 NOTE — Telephone Encounter (Signed)
This information has been completed.

## 2013-10-22 ENCOUNTER — Encounter: Payer: Self-pay | Admitting: Cardiovascular Disease

## 2014-09-07 ENCOUNTER — Encounter: Payer: Self-pay | Admitting: Cardiovascular Disease

## 2014-09-07 ENCOUNTER — Ambulatory Visit (INDEPENDENT_AMBULATORY_CARE_PROVIDER_SITE_OTHER): Payer: Medicare Other | Admitting: Cardiovascular Disease

## 2014-09-07 VITALS — BP 104/64 | HR 86 | Ht 68.0 in | Wt 175.2 lb

## 2014-09-07 DIAGNOSIS — E1159 Type 2 diabetes mellitus with other circulatory complications: Secondary | ICD-10-CM

## 2014-09-07 DIAGNOSIS — E785 Hyperlipidemia, unspecified: Secondary | ICD-10-CM

## 2014-09-07 DIAGNOSIS — I2510 Atherosclerotic heart disease of native coronary artery without angina pectoris: Secondary | ICD-10-CM

## 2014-09-07 DIAGNOSIS — R072 Precordial pain: Secondary | ICD-10-CM

## 2014-09-07 DIAGNOSIS — I739 Peripheral vascular disease, unspecified: Secondary | ICD-10-CM

## 2014-09-07 NOTE — Assessment & Plan Note (Signed)
No recent episodes of chest pain concerning for ischemia. No further workup at this time

## 2014-09-07 NOTE — Patient Instructions (Addendum)
You are doing well. No medication changes were made.  We will schedule you for a lower extremity u/s  Please call us if you have new issues that need to be addressed before your next appt.  Your physician wants you to follow-up in: 6 months.  You will receive a reminder letter in the mail two months in advance. If you don't receive a letter, please call our office to schedule the follow-up appointment.

## 2014-09-07 NOTE — Assessment & Plan Note (Signed)
Cholesterol is at goal on the current lipid regimen. No changes to the medications were made.  

## 2014-09-07 NOTE — Assessment & Plan Note (Signed)
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 We have ordered a lower extremity Doppler. Possible claudication symptoms

## 2014-09-07 NOTE — Progress Notes (Signed)
Patient ID: [name] [name], male    DOB: 04-10-1934, 81 y.o.   MRN: [id number]  HPI Comments: [name] [name] is a 81 year-old male with a >20-year history of diabetes as well as hyperlipidemia, cardiac catheterization in 2000, which showed moderate nonobstructive coronary disease with a 50% LAD lesion and a 60% lesion in his right coronary artery.  He has been managed medically.   He presents for routine followup of his coronary artery disease  In follow-up, he reports that he continues to exercise, walks and swims daily at least 5 days per week He is recent he told by his foot doctor that he had calcified arteries near his feet. He does report some pain in his lower extremity is when he walks He also reports having recent carotid ultrasound. Results are not available but was told that he had mild buildup Recent hemoglobin A1c of 9 He tries to watch his diet but continues to eat some fast food. Recently went to [employer] He really does not want to start insulin. Managed by Dr. Hyacinth Meeker and [facility] no significant chest pain with exertion  EKG on today's visit showing normal sinus rhythm with rate 86 bpm, no significant ST or T-wave changes   Other lab work shows total cholesterol 101, LDL 45 in October 2015  Other past medical history  Myoview  April 2008 showed normal LV function, EF of 57% with no ischemia or infarction.     CP Several years ago and underwent ETT in 8/10. Walked 9:17 on [place] with no CP or ST-T changes.      No Known Allergies  Outpatient Encounter Prescriptions as of 09/07/2014  Medication Sig  . aspirin 81 MG tablet Take 81 mg by mouth daily.    [name] galantamine (RAZADYNE) 4 MG tablet Take 8 mg by mouth 2 (two) times daily.   [name] glimepiride (AMARYL) 4 MG tablet Take 4 mg by mouth daily with breakfast.   . Glucosamine-Chondroitin 500-400 MG CAPS Take by mouth 2 (two) times daily. Take 3 tabs   . latanoprost (XALATAN) 0.005 % ophthalmic solution Place 1 drop into the left eye at  bedtime.  . metFORMIN (GLUCOPHAGE) 1000 MG tablet Take 1,000 mg by mouth 2 (two) times daily with a meal.  . nitroGLYCERIN (NITROSTAT) 0.4 MG SL tablet Place 1 tablet (0.4 mg total) under the tongue every 5 (five) minutes as needed for chest pain.  [name] omeprazole (PRILOSEC) 20 MG capsule Take 20 mg by mouth daily.    . simvastatin (ZOCOR) 40 MG tablet Take 40 mg by mouth at bedtime.    . sitaGLIPtin (JANUVIA) 100 MG tablet Take 100 mg by mouth daily.  . Tamsulosin HCl (FLOMAX) 0.4 MG CAPS Take by mouth at bedtime.    . [DISCONTINUED] pioglitazone (ACTOS) 30 MG tablet Take 30 mg by mouth daily.    Past Medical History  Diagnosis Date  . CAD (coronary artery disease)     non-obstructive by cath in 2000 - Myoview in 2008 negative. EF 57% - ETT in 8/10. Walked 9:17 on [place] w no CPor ST-T changes  . Hyperlipidemia   . Diabetes mellitus   . GERD (gastroesophageal reflux disease)   . Iron deficiency anemia     Past Surgical History  Procedure Laterality Date  . Removal r eye    . Anterior cruciate ligament repair    . Colon polyps removed      x2  . Cath (other)      Social History  reports that he has never smoked. He has never used smokeless tobacco. He reports that he drinks alcohol. He reports that he does not use illicit drugs.  Family History family history includes Arthritis in his sister; Heart attack in his father; Stroke in his mother.      Review of Systems  Constitutional: Negative.   Respiratory: Negative.   Cardiovascular: Negative.   Gastrointestinal: Negative.   Musculoskeletal: Negative.   Neurological: Negative.   Hematological: Negative.   Psychiatric/Behavioral: Negative.   All other systems reviewed and are negative.  BP 104/64 mmHg  Pulse 86  Ht  (1.727 m)  Wt 175 lb 4 oz (79.493 kg)  BMI 26.65 kg/m2 he 6 feet tall  Physical Exam  Constitutional: He is oriented to person, place, and time. He appears well-developed and well-nourished.  HENT:   Head: Normocephalic.  Nose: Nose normal.  Mouth/Throat: Oropharynx is clear and moist.  Eyes: Conjunctivae are normal. Pupils are equal, round, and reactive to light.  Neck: Normal range of motion. Neck supple. No JVD present.  Cardiovascular: Normal rate, regular rhythm, S1 normal, S2 normal, normal heart sounds and intact distal pulses.  Exam reveals no gallop and no friction rub.   No murmur heard. Pulmonary/Chest: Effort normal and breath sounds normal. No respiratory distress. He has no wheezes. He has no rales. He exhibits no tenderness.  Abdominal: Soft. Bowel sounds are normal. He exhibits no distension. There is no tenderness.  Musculoskeletal: Normal range of motion. He exhibits no edema or tenderness.  Lymphadenopathy:    He has no cervical adenopathy.  Neurological: He is alert and oriented to person, place, and time. Coordination normal.  Skin: Skin is warm and dry. No rash noted. No erythema.  Psychiatric: He has a normal mood and affect. His behavior is normal. Judgment and thought content normal.      Assessment and Plan   Nursing note and vitals reviewed.

## 2014-09-07 NOTE — Assessment & Plan Note (Signed)
Currently with no symptoms of angina. No further workup at this time. Continue current medication regimen. 

## 2014-09-07 NOTE — Assessment & Plan Note (Addendum)
Poorly controlled diabetes. Recommended close follow-up with Dr. Hyacinth Meeker  and [facility]. Long discussion about his diet and dietary guide provided to him today.

## 2014-09-21 ENCOUNTER — Encounter (INDEPENDENT_AMBULATORY_CARE_PROVIDER_SITE_OTHER): Payer: Medicare Other

## 2014-09-21 ENCOUNTER — Other Ambulatory Visit: Payer: Self-pay | Admitting: Radiology

## 2014-09-21 DIAGNOSIS — I2510 Atherosclerotic heart disease of native coronary artery without angina pectoris: Secondary | ICD-10-CM

## 2014-09-21 DIAGNOSIS — I739 Peripheral vascular disease, unspecified: Secondary | ICD-10-CM

## 2014-11-26 ENCOUNTER — Other Ambulatory Visit: Payer: Self-pay | Admitting: Cardiovascular Disease

## 2015-08-26 ENCOUNTER — Other Ambulatory Visit: Payer: Self-pay | Admitting: Internal Medicine

## 2015-08-26 DIAGNOSIS — R0989 Other specified symptoms and signs involving the circulatory and respiratory systems: Secondary | ICD-10-CM

## 2015-08-31 ENCOUNTER — Ambulatory Visit
Admission: RE | Admit: 2015-08-31 | Discharge: 2015-08-31 | Disposition: A | Discharge status: Home / Self Care | Payer: Medicare Other | Source: Ambulatory Visit | Attending: Internal Medicine | Admitting: Internal Medicine

## 2015-08-31 DIAGNOSIS — I6523 Occlusion and stenosis of bilateral carotid arteries: Secondary | ICD-10-CM | POA: Insufficient documentation

## 2015-08-31 DIAGNOSIS — R0989 Other specified symptoms and signs involving the circulatory and respiratory systems: Secondary | ICD-10-CM | POA: Insufficient documentation

## 2015-11-10 ENCOUNTER — Ambulatory Visit: Payer: Medicare Other | Admitting: Cardiovascular Disease

## 2015-11-16 ENCOUNTER — Encounter: Payer: Self-pay | Admitting: Cardiovascular Disease

## 2015-11-16 ENCOUNTER — Ambulatory Visit (INDEPENDENT_AMBULATORY_CARE_PROVIDER_SITE_OTHER): Payer: Medicare Other | Admitting: Cardiovascular Disease

## 2015-11-16 VITALS — BP 140/62 | HR 55 | Ht 69.0 in | Wt 172.0 lb

## 2015-11-16 DIAGNOSIS — R072 Precordial pain: Secondary | ICD-10-CM

## 2015-11-16 DIAGNOSIS — I739 Peripheral vascular disease, unspecified: Secondary | ICD-10-CM

## 2015-11-16 DIAGNOSIS — E1159 Type 2 diabetes mellitus with other circulatory complications: Secondary | ICD-10-CM

## 2015-11-16 DIAGNOSIS — E785 Hyperlipidemia, unspecified: Secondary | ICD-10-CM | POA: Diagnosis not present

## 2015-11-16 DIAGNOSIS — I2510 Atherosclerotic heart disease of native coronary artery without angina pectoris: Secondary | ICD-10-CM | POA: Diagnosis not present

## 2015-11-16 NOTE — Assessment & Plan Note (Signed)
Denies having any claudication type symptoms He has good lower extremity pulses, good radial pulses

## 2015-11-16 NOTE — Progress Notes (Signed)
Patient ID: [name] GravenHenry J Cataldi Jr., male    DOB: 04/16/1934, 82 y.o.   MRN: [id number]  HPI Comments: [name] is a 82 year-old male with a >20-year history of diabetes  hemoglobin A1c around 7.5, hyperlipidemia, cardiac catheterization in 2000, which showed moderate nonobstructive coronary disease with a 50% LAD lesion and a 60% lesion in his right coronary artery.  He has been managed medically.   He presents for routine followup of his coronary artery disease  In follow-up today, he continues to exercise, walks and swims daily at least 5 days per week Denies having any significant chest pain or shortness of breath on exertion  Recent hemoglobin A1c around 7.5 Ideally he does not want any additional medications for diabetes, prefers to do this with diet and exercise  Managed by Dr. Hyacinth MeekerMiller and Solum no significant chest pain with exertion Reports having relatively high exercise tolerance  Lab work reviewed with him showing total cholesterol 91, LDL 42  EKG on today's visit showing normal sinus rhythm with rate 55 bpm, no significant ST or T-wave changes  Other past medical history  Myoview  April 2008 showed normal LV function, EF of 57% with no ischemia or infarction.     CP Several years ago and underwent ETT in 8/10. Walked 9:17 on [place] with no CP or ST-T changes.      Allergies  Allergen Reactions  . Hydrocodone-Acetaminophen Rash    Outpatient Encounter Prescriptions as of 11/16/2015  Medication Sig  . aspirin 81 MG tablet Take 81 mg by mouth daily.    [name]. galantamine (RAZADYNE) 4 MG tablet Take 8 mg by mouth 2 (two) times daily.   [name]. glimepiride (AMARYL) 4 MG tablet Take 4 mg by mouth daily with breakfast.   . Glucosamine-Chondroitin 500-400 MG CAPS Take by mouth 2 (two) times daily. Take 3 tabs   . latanoprost (XALATAN) 0.005 % ophthalmic solution Place 1 drop into the left eye at bedtime.  . metFORMIN (GLUCOPHAGE) 1000 MG tablet Take 1,000 mg by mouth 2 (two) times daily with a  meal.  . NITROSTAT 0.4 MG SL tablet TAKE ONE TABLET UNDER THE TONGUE EVERY 5 MINUTES FOR CHEST PAIN. IF NO RELIEF PAST 3RD TAB GO TO EMERGENCY ROOM  . omeprazole (PRILOSEC) 20 MG capsule Take 20 mg by mouth daily.    . simvastatin (ZOCOR) 40 MG tablet Take 40 mg by mouth at bedtime.    . sitaGLIPtin (JANUVIA) 100 MG tablet Take 100 mg by mouth daily.  . [DISCONTINUED] Tamsulosin HCl (FLOMAX) 0.4 MG CAPS Take by mouth at bedtime. Reported on 11/16/2015   No facility-administered encounter medications on file as of 11/16/2015.    Past Medical History  Diagnosis Date  . CAD (coronary artery disease)     non-obstructive by cath in 2000 - Myoview in 2008 negative. EF 57% - ETT in 8/10. Walked 9:17 on [place] w no CPor ST-T changes  . Hyperlipidemia   . Diabetes mellitus   . GERD (gastroesophageal reflux disease)   . Iron deficiency anemia     Past Surgical History  Procedure Laterality Date  . Removal r eye    . Anterior cruciate ligament repair    . Colon polyps removed      x2  . Cath (other)      Social History  reports that he has never smoked. He has never used smokeless tobacco. He reports that he drinks alcohol. He reports that he does not use illicit drugs.  Family History family history includes Arthritis in his sister; Heart attack in his father; Stroke in his mother.      Review of Systems  Constitutional: Negative.   Respiratory: Negative.   Cardiovascular: Negative.   Gastrointestinal: Negative.   Musculoskeletal: Negative.   Neurological: Negative.   Hematological: Negative.   Psychiatric/Behavioral: Negative.   All other systems reviewed and are negative.  BP 140/62 mmHg  Pulse 55  Ht  (1.753 m)  Wt 78.019 kg (172 lb)  BMI 25.39 kg/m2 he 6 feet tall  Physical Exam  Constitutional: He is oriented to person, place, and time. He appears well-developed and well-nourished.  HENT:  Head: Normocephalic.  Nose: Nose normal.  Mouth/Throat: Oropharynx is  clear and moist.  Eyes: Conjunctivae are normal. Pupils are equal, round, and reactive to light.  Neck: Normal range of motion. Neck supple. No JVD present.  Cardiovascular: Normal rate, regular rhythm, S1 normal, S2 normal, normal heart sounds and intact distal pulses.  Exam reveals no gallop and no friction rub.   No murmur heard. Pulmonary/Chest: Effort normal and breath sounds normal. No respiratory distress. He has no wheezes. He has no rales. He exhibits no tenderness.  Abdominal: Soft. Bowel sounds are normal. He exhibits no distension. There is no tenderness.  Musculoskeletal: Normal range of motion. He exhibits no edema or tenderness.  Lymphadenopathy:    He has no cervical adenopathy.  Neurological: He is alert and oriented to person, place, and time. Coordination normal.  Skin: Skin is warm and dry. No rash noted. No erythema.  Psychiatric: He has a normal mood and affect. His behavior is normal. Judgment and thought content normal.      Assessment and Plan   Nursing note and vitals reviewed.

## 2015-11-16 NOTE — Assessment & Plan Note (Signed)
Hemoglobin A1c mildly elevated. He will discuss with Dr. Hyacinth MeekerMiller Ideally does not want additional medications

## 2015-11-16 NOTE — Assessment & Plan Note (Signed)
Cholesterol is at goal on the current lipid regimen. No changes to the medications were made.  

## 2015-11-16 NOTE — Patient Instructions (Signed)
You are doing well. No medication changes were made.  Please call us if you have new issues that need to be addressed before your next appt.  Your physician wants you to follow-up in: 12 months.  You will receive a reminder letter in the mail two months in advance. If you don't receive a letter, please call our office to schedule the follow-up appointment. 

## 2015-11-16 NOTE — Assessment & Plan Note (Signed)
Currently with no symptoms of angina. No further workup at this time. Continue current medication regimen. 

## 2016-03-10 ENCOUNTER — Other Ambulatory Visit: Payer: Self-pay | Admitting: *Deleted

## 2016-03-10 MED ORDER — NITROSTAT 0.4 MG SL SUBL: SUBLINGUAL_TABLET | SUBLINGUAL | Status: DC

## 2016-03-10 NOTE — Telephone Encounter (Signed)
Requested Prescriptions   Signed Prescriptions Disp Refills  . NITROSTAT 0.4 MG SL tablet 25 tablet 1    Sig: TAKE ONE TABLET UNDER THE TONGUE EVERY 5 MINUTES FOR CHEST PAIN. IF NO RELIEF PAST 3RD TAB GO TO EMERGENCY ROOM    Authorizing Provider: Antonieta IbaGOLLAN, TIMOTHY J    Ordering User: Kendrick FriesLOPEZ, Terra Aveni C

## 2016-05-16 ENCOUNTER — Encounter: Payer: Medicare Other | Attending: Internal Medicine | Admitting: Dietician

## 2016-05-16 ENCOUNTER — Encounter: Payer: Self-pay | Admitting: Dietician

## 2016-05-16 VITALS — BP 124/66 | Ht 68.0 in | Wt 167.5 lb

## 2016-05-16 DIAGNOSIS — E119 Type 2 diabetes mellitus without complications: Secondary | ICD-10-CM | POA: Diagnosis not present

## 2016-05-16 DIAGNOSIS — Z794 Long term (current) use of insulin: Secondary | ICD-10-CM | POA: Insufficient documentation

## 2016-05-16 NOTE — Progress Notes (Signed)
  Check blood sugars 2 x day before breakfast and 2 hrs after supper every day + some before lunch and after  exercise Exercise:  Continue walking/swimming 60+  min 5x/wk.  Avoid sugar sweetened drinks (soda, tea, coffee, sports drinks, juices) Limit intake of fried foods and sweets Eat 3 meals day,  1-2 snacks a day at bedtime & during exercise if needed Space meals 4-6 hours apart Eat 3-4 carbohydrate servings/meal + protein Eat 1 carbohydrate serving/snack + protein Complete 3 Day Food Record and bring to next appt Bring blood sugar records to the next appointment/class Carry fast acting glucose and a snack at all times Rotate injection sites Carry medical alert ID  Return for appointment/classes on:  06-06-16 Call if questions arise

## 2016-05-16 NOTE — Patient Instructions (Signed)
  Check blood sugars 2 x day before breakfast and 2 hrs after supper every day + some before lunch and during exercise Exercise:  Walk/swim 60+ min. 5x/wk. Avoid sugar sweetened drinks (soda, tea, coffee, sports drinks, juices) Limit intake of fried foods and sweets Eat 3 meals day,   1-2  snacks a day at bedtime and during exercise if needed Eat 3-4 carbohydrate serving/meal + protein Eat 1 carbohydrate serving/snack + protein Space meals 4-6 hours apart Complete 3 Day Food Record and bring to next appt Bring blood sugar records to the next appointment/class Carry fast acting glucose and a snack at all times Rotate injection sites Carry medical alert ID Call if questions Return for appointment/classes on:  06-06-16

## 2016-05-23 ENCOUNTER — Telehealth: Payer: Self-pay | Admitting: Dietician

## 2016-05-23 NOTE — Telephone Encounter (Signed)
Call pt for update on BG's-no answer-left message for pt to call me

## 2016-05-29 ENCOUNTER — Telehealth: Payer: Self-pay | Admitting: Dietician

## 2016-05-29 NOTE — Telephone Encounter (Signed)
Pt called and left message-reports he has been on vacation past wk and had some elevated BG's but he is now back home and will work on diet and diabetes care to improve BG's

## 2016-06-05 ENCOUNTER — Encounter: Payer: Self-pay | Admitting: Dietician

## 2016-06-05 NOTE — Progress Notes (Signed)
[name] cancelled his follow-up appointment for 06/06/16, and does not wish to reschedule at this time. Sent discharge letter to MD.

## 2016-06-06 ENCOUNTER — Ambulatory Visit: Payer: Medicare Other | Admitting: Dietician

## 2016-12-21 NOTE — Progress Notes (Signed)
Cardiology Office Note  Date:  12/22/2016   ID:  [name] Graven., DOB [birth year], MRN [id number]  PCP:  Danella Penton, MD   Chief Complaint  Patient presents with  . other    1 yr f/u no complaints today. Meds reviewed verbally with pt.    HPI:  [name] [name] is a 83 year-old male with  >20-year history of diabetes  hemoglobin A1c around 7.5,  hyperlipidemia,  cardiac catheterization in 2000, moderate nonobstructive coronary disease with a 50% LAD lesion and a 60% lesion in his right coronary artery.  presents for routine followup of his coronary artery disease  continues to exercise, walks 3 miles a day with dog,   and swims daily at least 5 days per week Denies having any significant chest pain or shortness of breath on exertion  hemoglobin A1c 9.5 down 7.6, started on insulin  Managed by Dr. Hyacinth Meeker and Solum  no significant chest pain with exertion Reports having relatively high exercise tolerance  Lab work reviewed with him showing  total cholesterol 98, LDL 50  EKG personally reviewed by myself on todays visit  showing normal sinus rhythm with rate 55 bpm, no significant ST or T-wave changes  Other past medical history  Myoview  April 2008 showed normal LV function, EF of 57% with no ischemia or infarction.     CP Several years ago and underwent ETT in 8/10. Walked 9:17 on [place] with no CP or ST-T changes.   PMH:   has a past medical history of CAD (coronary artery disease); CAD (coronary artery disease); Diabetes mellitus; GERD (gastroesophageal reflux disease); Glaucoma; Hyperlipidemia; Iron deficiency anemia; Memory loss; and Osteoporosis.  PSH:    Past Surgical History:  Procedure Laterality Date  . ANTERIOR CRUCIATE LIGAMENT REPAIR    . Cath (other)    . colon polyps removed     x2  . removal r eye      Current Outpatient Prescriptions  Medication Sig Dispense Refill  . aspirin 81 MG tablet Take 81 mg by mouth daily.      [name] EVENING PRIMROSE OIL PO Take 1  capsule by mouth daily.    [name] galantamine (RAZADYNE) 4 MG tablet Take 8 mg by mouth 2 (two) times daily.     [name] glimepiride (AMARYL) 4 MG tablet Take 4 mg by mouth 2 (two) times daily.     . Glucosamine-Chondroitin 500-400 MG CAPS Take by mouth daily. Take 3 tabs     . Insulin Detemir (LEVEMIR FLEXPEN) 100 UNIT/ML Pen Inject 15 Units into the skin daily at 10 pm.    . latanoprost (XALATAN) 0.005 % ophthalmic solution Place 1 drop into the left eye at bedtime.    . metFORMIN (GLUCOPHAGE) 1000 MG tablet Take 1,000 mg by mouth 2 (two) times daily with a meal.    . Multiple Vitamins-Iron (MULTIVITAMIN/IRON PO) Take 1 tablet by mouth daily.    [name] NITROSTAT 0.4 MG SL tablet TAKE ONE TABLET UNDER THE TONGUE EVERY 5 MINUTES FOR CHEST PAIN. IF NO RELIEF PAST 3RD TAB GO TO EMERGENCY ROOM 25 tablet 1  . Omega-3 Fatty Acids (FISH OIL PO) Take 1 capsule by mouth daily.    [name] omeprazole (PRILOSEC) 20 MG capsule Take 20 mg by mouth daily.      . simvastatin (ZOCOR) 40 MG tablet Take 40 mg by mouth at bedtime.       No current facility-administered medications for this visit.      Allergies:  Hydrocodone-acetaminophen   Social History:  The patient  reports that he has never smoked. He has never used smokeless tobacco. He reports that he drinks alcohol. He reports that he does not use drugs.   Family History:   family history includes Arthritis in his sister; Heart attack in his father; Stroke in his mother.    Review of Systems: Review of Systems  Constitutional: Negative.   Respiratory: Negative.   Cardiovascular: Negative.   Gastrointestinal: Negative.   Musculoskeletal: Negative.   Neurological: Negative.   Psychiatric/Behavioral: Negative.   All other systems reviewed and are negative.    PHYSICAL EXAM: VS:  BP 124/70 (BP Location: Left Arm, Patient Position: Sitting, Cuff Size: Normal)   Pulse 74   Ht  (1.727 m)   Wt 170 lb 8 oz (77.3 kg)   BMI 25.92 kg/m  , BMI Body mass index is  25.92 kg/m. GEN: Well nourished, well developed, in no acute distress  HEENT: normal  Neck: no JVD, carotid bruits, or masses Cardiac: RRR; no murmurs, rubs, or gallops,no edema  Respiratory:  clear to auscultation bilaterally, normal work of breathing GI: soft, nontender, nondistended, + BS MS: no deformity or atrophy  Skin: warm and dry, no rash Neuro:  Strength and sensation are intact Psych: euthymic mood, full affect    Recent Labs: No results found for requested labs within last 8760 hours.    Lipid Panel No results found for: CHOL, HDL, LDLCALC, TRIG    Wt Readings from Last 3 Encounters:  12/22/16 170 lb 8 oz (77.3 kg)  05/16/16 167 lb 8 oz (76 kg)  11/16/15 172 lb (78 kg)       ASSESSMENT AND PLAN:  Atherosclerosis of native coronary artery of native heart with stable angina pectoris (HCC) - Plan: EKG 12-Lead Currently with no symptoms of angina. No further workup at this time. Continue current medication regimen.  Type 2 diabetes mellitus with other circulatory complication, without long-term current use of insulin (HCC) - Plan: EKG 12-Lead We have encouraged continued exercise, careful diet management in an effort to lose weight.  PAD (peripheral artery disease) (HCC) - Plan: EKG 12-Lead Stressed importance of aggressive diabetes control  Mixed hyperlipidemia - Plan: EKG 12-Lead Cholesterol is at goal on the current lipid regimen. No changes to the medications were made.  Long discussion with him and patient's wife concerning exercise capacity. Wife has chronic pain issues back and knee  Total encounter time more than 25 minutes  Greater than 50% was spent in counseling and coordination of care with the patient  Disposition:   F/U  6 months   Orders Placed This Encounter  Procedures  . EKG 12-Lead     Signed, Dossie Arbour, M.D., Ph.D. 12/22/2016  [facility] [place], [place] [phone]

## 2016-12-22 ENCOUNTER — Ambulatory Visit (INDEPENDENT_AMBULATORY_CARE_PROVIDER_SITE_OTHER): Payer: Medicare Other | Admitting: Cardiovascular Disease

## 2016-12-22 ENCOUNTER — Encounter: Payer: Self-pay | Admitting: Cardiovascular Disease

## 2016-12-22 VITALS — BP 124/70 | HR 74 | Ht 68.0 in | Wt 170.5 lb

## 2016-12-22 DIAGNOSIS — I739 Peripheral vascular disease, unspecified: Secondary | ICD-10-CM | POA: Diagnosis not present

## 2016-12-22 DIAGNOSIS — E782 Mixed hyperlipidemia: Secondary | ICD-10-CM | POA: Diagnosis not present

## 2016-12-22 DIAGNOSIS — I25118 Atherosclerotic heart disease of native coronary artery with other forms of angina pectoris: Secondary | ICD-10-CM

## 2016-12-22 DIAGNOSIS — E1159 Type 2 diabetes mellitus with other circulatory complications: Secondary | ICD-10-CM | POA: Diagnosis not present

## 2016-12-22 NOTE — Patient Instructions (Signed)

## 2017-06-07 ENCOUNTER — Emergency Department: Payer: Medicare Other

## 2017-06-07 ENCOUNTER — Observation Stay
Admission: EM | Admit: 2017-06-07 | Discharge: 2017-06-08 | Disposition: A | Discharge status: Home / Self Care | Payer: Medicare Other | Attending: Internal Medicine | Admitting: Internal Medicine

## 2017-06-07 ENCOUNTER — Observation Stay: Payer: Medicare Other

## 2017-06-07 ENCOUNTER — Encounter: Payer: Self-pay | Admitting: Emergency Medicine

## 2017-06-07 ENCOUNTER — Other Ambulatory Visit: Payer: Self-pay

## 2017-06-07 DIAGNOSIS — F0390 Unspecified dementia without behavioral disturbance: Secondary | ICD-10-CM | POA: Insufficient documentation

## 2017-06-07 DIAGNOSIS — R29810 Facial weakness: Secondary | ICD-10-CM | POA: Diagnosis present

## 2017-06-07 DIAGNOSIS — K219 Gastro-esophageal reflux disease without esophagitis: Secondary | ICD-10-CM | POA: Diagnosis not present

## 2017-06-07 DIAGNOSIS — I6523 Occlusion and stenosis of bilateral carotid arteries: Principal | ICD-10-CM | POA: Insufficient documentation

## 2017-06-07 DIAGNOSIS — G9389 Other specified disorders of brain: Secondary | ICD-10-CM | POA: Insufficient documentation

## 2017-06-07 DIAGNOSIS — I639 Cerebral infarction, unspecified: Secondary | ICD-10-CM | POA: Diagnosis present

## 2017-06-07 DIAGNOSIS — Z794 Long term (current) use of insulin: Secondary | ICD-10-CM | POA: Diagnosis not present

## 2017-06-07 DIAGNOSIS — R410 Disorientation, unspecified: Secondary | ICD-10-CM | POA: Diagnosis not present

## 2017-06-07 DIAGNOSIS — E785 Hyperlipidemia, unspecified: Secondary | ICD-10-CM | POA: Diagnosis not present

## 2017-06-07 DIAGNOSIS — Z8673 Personal history of transient ischemic attack (TIA), and cerebral infarction without residual deficits: Secondary | ICD-10-CM | POA: Insufficient documentation

## 2017-06-07 DIAGNOSIS — Z79899 Other long term (current) drug therapy: Secondary | ICD-10-CM | POA: Insufficient documentation

## 2017-06-07 DIAGNOSIS — E1151 Type 2 diabetes mellitus with diabetic peripheral angiopathy without gangrene: Secondary | ICD-10-CM | POA: Diagnosis not present

## 2017-06-07 DIAGNOSIS — R262 Difficulty in walking, not elsewhere classified: Secondary | ICD-10-CM | POA: Diagnosis not present

## 2017-06-07 DIAGNOSIS — H409 Unspecified glaucoma: Secondary | ICD-10-CM | POA: Insufficient documentation

## 2017-06-07 DIAGNOSIS — I2510 Atherosclerotic heart disease of native coronary artery without angina pectoris: Secondary | ICD-10-CM | POA: Diagnosis not present

## 2017-06-07 DIAGNOSIS — Z7982 Long term (current) use of aspirin: Secondary | ICD-10-CM | POA: Insufficient documentation

## 2017-06-07 DIAGNOSIS — R531 Weakness: Secondary | ICD-10-CM

## 2017-06-07 DIAGNOSIS — R55 Syncope and collapse: Secondary | ICD-10-CM

## 2017-06-07 LAB — COMPREHENSIVE METABOLIC PANEL
ALT: 26 U/L (ref 17–63)
ANION GAP: 8 (ref 5–15)
AST: 26 U/L (ref 15–41)
Albumin: 3.9 g/dL (ref 3.5–5.0)
Alkaline Phosphatase: 57 U/L (ref 38–126)
BILIRUBIN TOTAL: 0.9 mg/dL (ref 0.3–1.2)
BUN: 18 mg/dL (ref 6–20)
CO2: 25 mmol/L (ref 22–32)
Calcium: 10 mg/dL (ref 8.9–10.3)
Chloride: 103 mmol/L (ref 101–111)
Creatinine, Ser: 0.8 mg/dL (ref 0.61–1.24)
Glucose, Bld: 187 mg/dL — ABNORMAL HIGH (ref 65–99)
POTASSIUM: 4.1 mmol/L (ref 3.5–5.1)
Sodium: 136 mmol/L (ref 135–145)
TOTAL PROTEIN: 7.2 g/dL (ref 6.5–8.1)

## 2017-06-07 LAB — URINALYSIS, COMPLETE (UACMP) WITH MICROSCOPIC
Bacteria, UA: NONE SEEN
Bilirubin Urine: NEGATIVE
GLUCOSE, UA: 150 mg/dL — AB
Hgb urine dipstick: NEGATIVE
Ketones, ur: NEGATIVE mg/dL
Leukocytes, UA: NEGATIVE
NITRITE: NEGATIVE
PH: 6 (ref 5.0–8.0)
PROTEIN: 30 mg/dL — AB
RBC / HPF: NONE SEEN RBC/hpf (ref 0–5)
Specific Gravity, Urine: 1.011 (ref 1.005–1.030)
Squamous Epithelial / LPF: NONE SEEN

## 2017-06-07 LAB — DIFFERENTIAL
Basophils Absolute: 0 10*3/uL (ref 0–0.1)
Basophils Relative: 0 %
EOS ABS: 0.1 10*3/uL (ref 0–0.7)
EOS PCT: 1 %
LYMPHS ABS: 2.2 10*3/uL (ref 1.0–3.6)
Lymphocytes Relative: 20 %
MONOS PCT: 8 %
Monocytes Absolute: 0.9 10*3/uL (ref 0.2–1.0)
NEUTROS PCT: 71 %
Neutro Abs: 8.2 10*3/uL — ABNORMAL HIGH (ref 1.4–6.5)

## 2017-06-07 LAB — CBC
HEMATOCRIT: 41.9 % (ref 40.0–52.0)
HEMOGLOBIN: 14.8 g/dL (ref 13.0–18.0)
MCH: 30.7 pg (ref 26.0–34.0)
MCHC: 35.4 g/dL (ref 32.0–36.0)
MCV: 86.9 fL (ref 80.0–100.0)
Platelets: 187 10*3/uL (ref 150–440)
RBC: 4.82 MIL/uL (ref 4.40–5.90)
RDW: 13.5 % (ref 11.5–14.5)
WBC: 11.5 10*3/uL — ABNORMAL HIGH (ref 3.8–10.6)

## 2017-06-07 LAB — TROPONIN I

## 2017-06-07 LAB — LIPID PANEL
CHOL/HDL RATIO: 2.4 ratio
CHOLESTEROL: 88 mg/dL (ref 0–200)
HDL: 36 mg/dL — AB (ref >40)
LDL Cholesterol: 39 mg/dL (ref 0–99)
Triglycerides: 66 mg/dL (ref <150)
VLDL: 13 mg/dL (ref 0–40)

## 2017-06-07 LAB — PROTIME-INR
INR: 0.98
Prothrombin Time: 12.9 seconds (ref 11.4–15.2)

## 2017-06-07 LAB — APTT: aPTT: 29 seconds (ref 24–36)

## 2017-06-07 LAB — GLUCOSE, CAPILLARY
GLUCOSE-CAPILLARY: 366 mg/dL — AB (ref 65–99)
Glucose-Capillary: 165 mg/dL — ABNORMAL HIGH (ref 65–99)

## 2017-06-07 LAB — TSH: TSH: 0.684 u[IU]/mL (ref 0.350–4.500)

## 2017-06-07 MED ORDER — NITROGLYCERIN 0.4 MG SL SUBL: 0.4000 mg | SUBLINGUAL_TABLET | SUBLINGUAL | Status: DC | PRN

## 2017-06-07 MED ORDER — INSULIN DETEMIR 100 UNIT/ML ~~LOC~~ SOLN
20.0000 [IU] | Freq: Every day | SUBCUTANEOUS | Status: DC
  Administered 2017-06-07: 22:00:00 20 [IU] via SUBCUTANEOUS
  Filled 2017-06-07 (×2): qty 0.2

## 2017-06-07 MED ORDER — DOCUSATE SODIUM 100 MG PO CAPS
100.0000 mg | ORAL_CAPSULE | Freq: Two times a day (BID) | ORAL | Status: DC | PRN
  Administered 2017-06-08: 100 mg via ORAL
  Filled 2017-06-07: qty 1

## 2017-06-07 MED ORDER — GLUCOSAMINE-CHONDROITIN 500-400 MG PO CAPS: ORAL_CAPSULE | Freq: Every day | ORAL | Status: DC

## 2017-06-07 MED ORDER — GALANTAMINE HYDROBROMIDE 4 MG PO TABS
8.0000 mg | ORAL_TABLET | Freq: Two times a day (BID) | ORAL | Status: DC
  Administered 2017-06-08 (×2): 8 mg via ORAL
  Filled 2017-06-07 (×4): qty 2

## 2017-06-07 MED ORDER — GALANTAMINE HYDROBROMIDE 4 MG PO TABS
12.0000 mg | ORAL_TABLET | Freq: Two times a day (BID) | ORAL | Status: DC
Start: 2017-06-07 — End: 2017-06-07
  Filled 2017-06-07: qty 3

## 2017-06-07 MED ORDER — PANTOPRAZOLE SODIUM 40 MG PO TBEC
40.0000 mg | DELAYED_RELEASE_TABLET | Freq: Every day | ORAL | Status: DC
  Administered 2017-06-07: 40 mg via ORAL
  Filled 2017-06-07: qty 1

## 2017-06-07 MED ORDER — SIMVASTATIN 20 MG PO TABS
40.0000 mg | ORAL_TABLET | Freq: Every day | ORAL | Status: DC
  Administered 2017-06-07: 40 mg via ORAL
  Filled 2017-06-07: qty 2

## 2017-06-07 MED ORDER — ENOXAPARIN SODIUM 40 MG/0.4ML ~~LOC~~ SOLN
40.0000 mg | SUBCUTANEOUS | Status: DC
  Administered 2017-06-07: 40 mg via SUBCUTANEOUS
  Filled 2017-06-07: qty 0.4

## 2017-06-07 MED ORDER — ASPIRIN EC 81 MG PO TBEC
81.0000 mg | DELAYED_RELEASE_TABLET | Freq: Every day | ORAL | Status: DC
  Administered 2017-06-07 – 2017-06-08 (×2): 81 mg via ORAL
  Filled 2017-06-07 (×2): qty 1

## 2017-06-07 MED ORDER — GLIMEPIRIDE 2 MG PO TABS
4.0000 mg | ORAL_TABLET | Freq: Two times a day (BID) | ORAL | Status: DC
  Administered 2017-06-07 – 2017-06-08 (×2): 4 mg via ORAL
  Filled 2017-06-07 (×2): qty 2
  Filled 2017-06-07: qty 1
  Filled 2017-06-07: qty 2

## 2017-06-07 MED ORDER — LATANOPROST 0.005 % OP SOLN
1.0000 [drp] | Freq: Every day | OPHTHALMIC | Status: DC
  Administered 2017-06-07: 1 [drp] via OPHTHALMIC
  Filled 2017-06-07: qty 2.5

## 2017-06-07 MED ORDER — ALENDRONATE SODIUM 70 MG PO TABS: 70.0000 mg | ORAL_TABLET | ORAL | Status: DC

## 2017-06-07 MED ORDER — METFORMIN HCL 500 MG PO TABS
1000.0000 mg | ORAL_TABLET | Freq: Two times a day (BID) | ORAL | Status: DC
  Administered 2017-06-08: 1000 mg via ORAL
  Filled 2017-06-07 (×2): qty 2

## 2017-06-07 NOTE — H&P (Signed)
[facility] [place] [facility]   PATIENT NAME: [name] [name]    MR#:  [id number]  DATE OF BIRTH:  [birth year]  DATE OF ADMISSION:  06/07/2017  PRIMARY CARE PHYSICIAN: Danella Penton, MD   REQUESTING/REFERRING PHYSICIAN: [email or web address]  CHIEF COMPLAINT:   Chief Complaint  Patient presents with  . Code Stroke    HISTORY OF PRESENT ILLNESS: [name] [name]  is a 83 y.o. male with a known history of CAD, GERD, Glaucoma, HLD, Anemia- returned back from trip to [place] 2 days ago, did not have enough sleep on almost 12 hrs flight. Feeling very fatigued and dizzi yesterday and today. Had imbalance and a fall today, so went to his PMD and advised to go to ER. Seen by Neurologist, No focal neurological symptoms. Advised to do stroke work ups and observe in hospital.  PAST MEDICAL HISTORY:   Past Medical History:  Diagnosis Date  . CAD (coronary artery disease)    non-obstructive by cath in 2000 - Myoview in 2008 negative. EF 57% - ETT in 8/10. Walked 9:17 on [place] w no CPor ST-T changes  . CAD (coronary artery disease)   . Diabetes mellitus   . GERD (gastroesophageal reflux disease)   . Glaucoma   . Hyperlipidemia   . Iron deficiency anemia   . Memory loss   . Osteoporosis     PAST SURGICAL HISTORY: Past Surgical History:  Procedure Laterality Date  . ANTERIOR CRUCIATE LIGAMENT REPAIR    . Cath (other)    . colon polyps removed     x2  . removal r eye      SOCIAL HISTORY:  Social History  Substance Use Topics  . Smoking status: Never Smoker  . Smokeless tobacco: Never Used  . Alcohol use 0.0 - 0.6 oz/week    FAMILY HISTORY:  Family History  Problem Relation Age of Onset  . Stroke Mother   . Heart attack Father   . Arthritis Sister     DRUG ALLERGIES:  Allergies  Allergen Reactions  . Hydrocodone-Acetaminophen Rash    REVIEW OF SYSTEMS:   CONSTITUTIONAL: No fever, positive for fatigue or weakness.  EYES: No blurred or double vision.  EARS, NOSE,  AND THROAT: No tinnitus or ear pain.  RESPIRATORY: No cough, shortness of breath, wheezing or hemoptysis.  CARDIOVASCULAR: No chest pain, orthopnea, edema.  GASTROINTESTINAL: No nausea, vomiting, diarrhea or abdominal pain.  GENITOURINARY: No dysuria, hematuria.  ENDOCRINE: No polyuria, nocturia,  HEMATOLOGY: No anemia, easy bruising or bleeding SKIN: No rash or lesion. MUSCULOSKELETAL: No joint pain or arthritis.   NEUROLOGIC: No tingling, numbness, weakness.  PSYCHIATRY: No anxiety or depression.   MEDICATIONS AT HOME:  Prior to Admission medications   Medication Sig Start Date End Date Taking? Authorizing Provider  alendronate (FOSAMAX) 70 MG tablet Take 70 mg by mouth once a week. Take with a full glass of water on an empty stomach.   Yes [provider]  aspirin 81 MG tablet Take 81 mg by mouth daily.     Yes [provider]  EVENING PRIMROSE OIL PO Take 1 capsule by mouth daily.   Yes [provider]  galantamine (RAZADYNE) 12 MG tablet Take 12 mg by mouth 2 (two) times daily.    Yes [provider]  glimepiride (AMARYL) 4 MG tablet Take 4 mg by mouth 2 (two) times daily.    Yes [provider]  Glucosamine-Chondroitin 500-400 MG CAPS Take by mouth daily. Take 3 tabs  Yes [provider]  Insulin Detemir (LEVEMIR FLEXPEN) 100 UNIT/ML Pen Inject 20 Units into the skin daily at 10 pm.    Yes [provider]  latanoprost (XALATAN) 0.005 % ophthalmic solution Place 1 drop into the left eye at bedtime.   Yes [provider]  metFORMIN (GLUCOPHAGE) 1000 MG tablet Take 1,000 mg by mouth 2 (two) times daily with a meal.   Yes [provider]  Multiple Vitamins-Iron (MULTIVITAMIN/IRON PO) Take 1 tablet by mouth daily.   Yes [provider]  NITROSTAT 0.4 MG SL tablet TAKE ONE TABLET UNDER THE TONGUE EVERY 5 MINUTES FOR CHEST PAIN. IF NO RELIEF PAST 3RD TAB GO TO EMERGENCY ROOM 03/10/16  Yes Gollan, Tollie Pizza, MD  Omega-3 Fatty Acids (FISH OIL PO) Take 1 capsule by mouth daily.   Yes [provider]  omeprazole (PRILOSEC) 20 MG capsule Take 20 mg by mouth daily.     Yes [provider]  simvastatin (ZOCOR) 40 MG tablet Take 40 mg by mouth at bedtime.     Yes [provider]      PHYSICAL EXAMINATION:   VITAL SIGNS: Blood pressure 139/70, pulse 80, temperature (!) 97.5 F (36.4 C), resp. rate 15, height  (1.727 m), weight 74.8 kg (165 lb), SpO2 98 %.  GENERAL:  83 y.o.-year-old patient lying in the bed with no acute distress.  EYES: Pupils equal, round, reactive to light and accommodation. No scleral icterus. Extraocular muscles intact.  HEENT: Head atraumatic, normocephalic. Oropharynx and nasopharynx clear.  NECK:  Supple, no jugular venous distention. No thyroid enlargement, no tenderness.  LUNGS: Normal breath sounds bilaterally, no wheezing, rales,rhonchi or crepitation. No use of accessory muscles of respiration.  CARDIOVASCULAR: S1, S2 normal. No murmurs, rubs, or gallops.  ABDOMEN: Soft, nontender, nondistended. Bowel sounds present. No organomegaly or mass.  EXTREMITIES: No pedal edema, cyanosis, or clubbing.  NEUROLOGIC: Cranial nerves II through XII are intact. Muscle strength 4-5/5 in all extremities. Sensation intact. Gait not checked.  PSYCHIATRIC: The patient is alert and oriented x 3.  SKIN: No obvious rash, lesion, or ulcer.   LABORATORY PANEL:   CBC  Recent Labs Lab 06/07/17 1444  WBC 11.5*  HGB 14.8  HCT 41.9  PLT 187  MCV 86.9  MCH 30.7  MCHC 35.4  RDW 13.5  LYMPHSABS 2.2  MONOABS 0.9  EOSABS 0.1  BASOSABS 0.0   ------------------------------------------------------------------------------------------------------------------  Chemistries   Recent Labs Lab 06/07/17 1444  NA 136  K 4.1  CL 103  CO2 25  GLUCOSE 187*  BUN 18  CREATININE 0.80  CALCIUM 10.0  AST 26  ALT 26  ALKPHOS 57  BILITOT 0.9    ------------------------------------------------------------------------------------------------------------------ estimated creatinine clearance is 71.3 mL/min (by C-G formula based on SCr of 0.8 mg/dL). ------------------------------------------------------------------------------------------------------------------ No results for input(s): TSH, T4TOTAL, T3FREE, THYROIDAB in the last 72 hours.  Invalid input(s): FREET3   Coagulation profile  Recent Labs Lab 06/07/17 1444  INR 0.98   ------------------------------------------------------------------------------------------------------------------- No results for input(s): DDIMER in the last 72 hours. -------------------------------------------------------------------------------------------------------------------  Cardiac Enzymes  Recent Labs Lab 06/07/17 1444  TROPONINI <0.03   ------------------------------------------------------------------------------------------------------------------ Invalid input(s): POCBNP  ---------------------------------------------------------------------------------------------------------------  Urinalysis    Component Value Date/Time   COLORURINE AMBER (A) 06/07/2017 1607   APPEARANCEUR CLOUDY (A) 06/07/2017 1607   LABSPEC 1.011 06/07/2017 1607   PHURINE 6.0 06/07/2017 1607   GLUCOSEU 150 (A) 06/07/2017 1607   HGBUR NEGATIVE 06/07/2017 1607   BILIRUBINUR NEGATIVE 06/07/2017 1607  KETONESUR NEGATIVE 06/07/2017 1607   PROTEINUR 30 (A) 06/07/2017 1607   NITRITE NEGATIVE 06/07/2017 1607   LEUKOCYTESUR NEGATIVE 06/07/2017 1607     RADIOLOGY: Ct Head Code Stroke Wo Contrast  Result Date: 06/07/2017 CLINICAL DATA:  Code stroke. Altered level of consciousness, unexplained. Right-sided facial droop. EXAM: CT HEAD WITHOUT CONTRAST TECHNIQUE: Contiguous axial images were obtained from the base of the skull through the vertex without intravenous contrast. COMPARISON:  Brain MRI  03/30/2008 FINDINGS: Brain: No evidence of acute infarction, hemorrhage, hydrocephalus, extra-axial collection or mass lesion/mass effect. Confluent low-density in the cerebral white matter consistent with chronic small vessel ischemia. Cerebral atrophy, mild to moderate for age. Vascular: Atherosclerotic calcification.  No hyperdense vessel. Skull: No acute or aggressive finding Sinuses/Orbits: Right enucleation with prosthesis.  No acute finding Other: These results were called by telephone at the time of interpretation on 06/07/2017 at 2:55 pm to Dr. Lamont Snowball , who verbally acknowledged these results. ASPECTS [facility] Stroke Program Early CT Score) -left hemisphere - Ganglionic level infarction (caudate, lentiform nuclei, internal capsule, insula, M1-M3 cortex): 7 - Supraganglionic infarction (M4-M6 cortex): 3 Total score (0-10 with 10 being normal): 10 IMPRESSION: 1. No acute finding. ASPECTS is 10. 2. Extensive chronic small vessel ischemia in the cerebral white matter. Electronically Signed   By: Marnee Spring M.D.   On: 06/07/2017 14:59    EKG: Orders placed or performed during the hospital encounter of 06/07/17  . ED EKG  . ED EKG    IMPRESSION AND PLAN:  * Generalized weakness r/o TIA    Checked UA- negative.    MRI brain, Echo, Carotid doppler    Check Lipid panel, HBA1c, TSH.    PT. OT eval, neuro consult, Orthostatic vital signs.  * DM   COnt home meds, Keep On ISS   As per daughter blood sugar with EMS was 197 this morning.  * Hyperlipidemia   Cont simvastatin  * Glaucoma    Cont eye drop.  * elevated wbcs,     No signs of infection yet.    UA negative.    May check Influenza, if rise further ( as pt have fatigue symptoms)  All the records are reviewed and case discussed with ED provider. Management plans discussed with the patient, family and they are in agreement.  CODE STATUS: Full. Code Status History    This patient does not have a recorded code status. Please  follow your organizational policy for patients in this situation.    Advance Directive Documentation     Most Recent Value  Type of Advance Directive  Healthcare Power of Attorney, Living will  Pre-existing out of facility DNR order (yellow form or pink MOST form)  -  "MOST" Form in Place?  -     Discussed with wife and 2 daughters in room.  TOTAL TIME TAKING CARE OF THIS PATIENT: 45 minutes.    Altamese Dilling M.D on 06/07/2017   Between 7am to 6pm - Pager - [phone]  After 6pm go to [email or web address] - password [email or web address]  [facility]  Office  [phone]  CC: Primary care physician; Danella Penton, MD   Note: This dictation was prepared with Dragon dictation along with smaller phrase technology. Any transcriptional errors that result from this process are unintentional.

## 2017-06-07 NOTE — ED Triage Notes (Signed)
Patient presents to ED via POV from pcp office, Dr. Hyacinth Meeker. Patient presents to ED with right sided facial droop. Even, strong and symmetrical hand grasps noted bilaterally. Dr. Lamont Snowball notified and at patients side in triage. Verbal order to initiate code stroke at this time. Patient traveled back the states yesterday from [place]. Patients wife reports at 1100 this morning patient walked into their house and was off balance. He reported to his wife that he felt very weak and that he had fallen. Wife reports patient was very pale.

## 2017-06-07 NOTE — Consult Note (Signed)
Referring Physician: Pershing Proud    Chief Complaint: Dizziness, gait ataxia, confusion  HPI: [name] [name]. is an 83 y.o. male with a history of dementia who on yesterday was travelling back from [place].  Became confused on the plane and remained confused and agitated throughout the evening.  This morning the patient was felt by the wife to be normal but daughters feel he was doing some inconsistent things.  Went out to walk the dog and on returning was off balance and again confused.  EMS was called at that time.  Initial NIHSS of 0.  Date last known well: Date: 06/07/2017 Time last known well: Time: 02:00 tPA Given: No: Resolution of symptoms  Past Medical History:  Diagnosis Date  . CAD (coronary artery disease)    non-obstructive by cath in 2000 - Myoview in 2008 negative. EF 57% - ETT in 8/10. Walked 9:17 on [place] w no CPor ST-T changes  . CAD (coronary artery disease)   . Diabetes mellitus   . GERD (gastroesophageal reflux disease)   . Glaucoma   . Hyperlipidemia   . Iron deficiency anemia   . Memory loss   . Osteoporosis     Past Surgical History:  Procedure Laterality Date  . ANTERIOR CRUCIATE LIGAMENT REPAIR    . Cath (other)    . colon polyps removed     x2  . removal r eye      Family History  Problem Relation Age of Onset  . Stroke Mother   . Heart attack Father   . Arthritis Sister    Social History:  reports that he has never smoked. He has never used smokeless tobacco. He reports that he drinks alcohol. He reports that he does not use drugs.  Allergies:  Allergies  Allergen Reactions  . Hydrocodone-Acetaminophen Rash    Medications: I have reviewed the patient's current medications. Prior to Admission:  Prior to Admission medications   Medication Sig Start Date End Date Taking? Authorizing Provider  aspirin 81 MG tablet Take 81 mg by mouth daily.      [provider]  EVENING PRIMROSE OIL PO Take 1 capsule by mouth daily.    [provider]  galantamine (RAZADYNE) 4 MG tablet Take 8 mg by mouth 2 (two) times daily.     [provider]  glimepiride (AMARYL) 4 MG tablet Take 4 mg by mouth 2 (two) times daily.     [provider]  Glucosamine-Chondroitin 500-400 MG CAPS Take by mouth daily. Take 3 tabs     [provider]  Insulin Detemir (LEVEMIR FLEXPEN) 100 UNIT/ML Pen Inject 15 Units into the skin daily at 10 pm.    [provider]  latanoprost (XALATAN) 0.005 % ophthalmic solution Place 1 drop into the left eye at bedtime.    [provider]  metFORMIN (GLUCOPHAGE) 1000 MG tablet Take 1,000 mg by mouth 2 (two) times daily with a meal.    [provider]  Multiple Vitamins-Iron (MULTIVITAMIN/IRON PO) Take 1 tablet by mouth daily.    [provider]  NITROSTAT 0.4 MG SL tablet TAKE ONE TABLET UNDER THE TONGUE EVERY 5 MINUTES FOR CHEST PAIN. IF NO RELIEF PAST 3RD TAB GO TO EMERGENCY ROOM 03/10/16   Antonieta Iba, MD  Omega-3 Fatty Acids (FISH OIL PO) Take 1 capsule by mouth daily.    [provider]  omeprazole (PRILOSEC) 20 MG capsule Take 20 mg by mouth daily.  [provider]  simvastatin (ZOCOR) 40 MG tablet Take 40 mg by mouth at bedtime.      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 ENT ROS: negative for - epistaxis, nasal discharge, oral lesions, sore throat, tinnitus or vertigo Allergy and Immunology ROS: negative for - hives or itchy/watery eyes Hematological and Lymphatic ROS: negative for - bleeding problems, bruising or swollen lymph nodes Endocrine ROS: negative for - galactorrhea, hair pattern changes, polydipsia/polyuria or temperature  intolerance Respiratory ROS: negative for - cough, hemoptysis, shortness of breath or wheezing Cardiovascular ROS: negative for - chest pain, dyspnea on exertion, edema or irregular heartbeat Gastrointestinal ROS: abdominal pain Genito-Urinary ROS: negative for - dysuria, hematuria, incontinence or urinary frequency/urgency Musculoskeletal ROS: negative for - joint swelling or muscular weakness Neurological ROS: as noted in HPI Dermatological ROS: negative for rash and skin lesion changes  Physical Examination: Blood pressure (!) 124/55, pulse (!) 56, temperature 97.7 F (36.5 C), temperature source Oral, resp. rate 14, height  (1.727 m), weight 74.8 kg (165 lb), SpO2 96 %.  HEENT-  Normocephalic, no lesions, without obvious abnormality.  Normal external eye and conjunctiva.  Normal TM's bilaterally.  Normal auditory canals and external ears. Normal external nose, mucus membranes and septum.  Normal pharynx. Cardiovascular- S1, S2 normal, pulses palpable throughout   Lungs- chest clear, no wheezing, rales, normal symmetric air entry Abdomen- soft, non-tender; bowel sounds normal; no masses,  no organomegaly Extremities- no edema Lymph-no adenopathy palpable Musculoskeletal-no joint tenderness, deformity or swelling Skin-warm and dry, no hyperpigmentation, vitiligo, or suspicious lesions  Neurological Examination   Mental Status: Alert, oriented, thought content appropriate.  Speech fluent without evidence of aphasia.  Able to follow 3 step commands without difficulty. Cranial Nerves: II: Disc flat; Visual fields grossly normal in the left eye, left pupil reactive  III,IV, VI: mild right ptosist, extra-ocular motions intact  V,VII: smile symmetric, facial light touch sensation normal bilaterally VIII: hearing normal bilaterally IX,X: gag reflex present XI: bilateral shoulder shrug XII: midline tongue extension Motor: Right : Upper extremity   5/5    Left:     Upper extremity    5/5  Lower extremity   5/5     Lower extremity   5/5 Tone and bulk:normal tone throughout; no atrophy noted Sensory: Pinprick and light touch intact throughout, bilaterally Deep Tendon Reflexes: 2+ and symmetric with absent lower extremity DTR's bilaterally Plantars: Right: downgoing   Left: downgoing Cerebellar: normal finger-to-nose and normal heel-to-shin testing bilaterally Gait: not tested due to safety concerns    Laboratory Studies:  Basic Metabolic Panel:  Recent Labs Lab 06/07/17 1444  NA 136  K 4.1  CL 103  CO2 25  GLUCOSE 187*  BUN 18  CREATININE 0.80  CALCIUM 10.0    Liver Function Tests:  Recent Labs Lab 06/07/17 1444  AST 26  ALT 26  ALKPHOS 57  BILITOT 0.9  PROT 7.2  ALBUMIN 3.9   No results for input(s): LIPASE, AMYLASE in the last 168 hours. No results for input(s): AMMONIA in the last 168 hours.  CBC:  Recent Labs Lab 06/07/17 1444  WBC 11.5*  NEUTROABS 8.2*  HGB 14.8  HCT 41.9  MCV 86.9  PLT 187    Cardiac Enzymes:  Recent Labs Lab 06/07/17 1444  TROPONINI <0.03    BNP: Invalid input(s): POCBNP  CBG:  Recent Labs Lab 06/07/17 1501 06/07/17 2117  GLUCAP 165* 366*    Microbiology: No results found for this or any previous visit.  Coagulation Studies:  Recent Labs  06/07/17 1444  LABPROT 12.9  INR 0.98    Urinalysis:   Recent Labs Lab 06/07/17 1607  COLORURINE AMBER*  LABSPEC 1.011  PHURINE 6.0  GLUCOSEU 150*  HGBUR NEGATIVE  BILIRUBINUR NEGATIVE  KETONESUR NEGATIVE  PROTEINUR 30*  NITRITE NEGATIVE  LEUKOCYTESUR NEGATIVE    Lipid Panel:    Component Value Date/Time   CHOL 88 06/07/2017 1449   TRIG 66 06/07/2017 1449   HDL 36 (L) 06/07/2017 1449   CHOLHDL 2.4 06/07/2017 1449   VLDL 13 06/07/2017 1449   LDLCALC 39 06/07/2017 1449    HgbA1C: No results found for: HGBA1C  Urine Drug Screen:  No results found for: LABOPIA, COCAINSCRNUR, LABBENZ, AMPHETMU, THCU, LABBARB  Alcohol Level: No  results for input(s): ETH in the last 168 hours.  Other results: EKG: sinus rhythm at 70 bpm.  Imaging: Dg Chest 2 View  Result Date: 06/07/2017 CLINICAL DATA:  83 year old male with a history of right-sided facial droop EXAM: CHEST  2 VIEW COMPARISON:  None. FINDINGS: Cardiomediastinal silhouette unchanged in size and contour. Coarsened interstitial markings. Lateral view demonstrates ill-defined airspace opacity at the posterior lung base. No pneumothorax.  No pleural effusion. IMPRESSION: Airspace opacity at the posterior lung base on the lateral view may reflect developing infection. Correlation with lab results and presentation may be useful. Electronically Signed   By: Gilmer Mor D.O.   On: 06/07/2017 16:36   Ct Head Code Stroke Wo Contrast  Result Date: 06/07/2017 CLINICAL DATA:  Code stroke. Altered level of consciousness, unexplained. Right-sided facial droop. EXAM: CT HEAD WITHOUT CONTRAST TECHNIQUE: Contiguous axial images were obtained from the base of the skull through the vertex without intravenous contrast. COMPARISON:  Brain MRI 03/30/2008 FINDINGS: Brain: No evidence of acute infarction, hemorrhage, hydrocephalus, extra-axial collection or mass lesion/mass effect. Confluent low-density in the cerebral white matter consistent with chronic small vessel ischemia. Cerebral atrophy, mild to moderate for age. Vascular: Atherosclerotic calcification.  No hyperdense vessel. Skull: No acute or aggressive finding Sinuses/Orbits: Right enucleation with prosthesis.  No acute finding Other: These results were called by telephone at the time of interpretation on 06/07/2017 at 2:55 pm to Dr. Lamont Snowball , who verbally acknowledged these results. ASPECTS [facility] Stroke Program Early CT Score) -left hemisphere - Ganglionic level infarction (caudate, lentiform nuclei, internal capsule, insula, M1-M3 cortex): 7 - Supraganglionic infarction (M4-M6 cortex): 3 Total score (0-10 with 10 being normal): 10  IMPRESSION: 1. No acute finding. ASPECTS is 10. 2. Extensive chronic small vessel ischemia in the cerebral white matter. Electronically Signed   By: Marnee Spring M.D.   On: 06/07/2017 14:59    Assessment: 83 y.o. male with a history of dementia presenting with confusion and gait instability.  Patient now at baseline.  It is very likely that the symptoms are related to situational worsening of his cognitive function.  With patient's risk factors will rule out ischemic disease.  Head CT reviewed and shows no acute changes.  Patient on ASA and a statin at home.  Stroke Risk Factors - diabetes mellitus and hyperlipidemia  Plan: 1. HgbA1c, fasting lipid panel 2. MRI, MRA  of the brain without contrast.  Would not initiate stroke work up unless indicative of an  acute infarct. 3. PT consult, OT consult, Speech consult 4. Prophylactic therapy-Continue ASA 5. NPO until RN stroke swallow screen 6. Telemetry monitoring 7. Frequent neuro checks   Case discussed with Dr. Talbert Cage, MD Neurology [phone] 06/07/2017, 11:56 PM

## 2017-06-07 NOTE — Progress Notes (Signed)
PHARMACIST - PHYSICIAN ORDER COMMUNICATION  CONCERNING: P&T Medication Policy on Herbal Medications  DESCRIPTION:  This patient's order for: Glucosamine-Chondroitin  has been noted.  This product(s) is classified as an "herbal" or natural product. Due to a lack of definitive safety studies or FDA approval, nonstandard manufacturing practices, plus the potential risk of unknown drug-drug interactions while on inpatient medications, the Pharmacy and Therapeutics Committee does not permit the use of "herbal" or natural products of this type within [facility].   ACTION TAKEN: The pharmacy department is unable to verify this order at this time. Please reevaluate patient's clinical condition at discharge and address if the herbal or natural product(s) should be resumed at that time.  Bari Mantis PharmD Clinical Pharmacist 06/07/2017

## 2017-06-07 NOTE — Progress Notes (Signed)
PHARMACIST - PHYSICIAN ORDER COMMUNICATION  CONCERNING: P&T Medication Policy on Herbal Medications  DESCRIPTION:  This patient's order for:  Glucosamine-chondroitin  has been noted.  This product(s) is classified as an "herbal" or natural product. Due to a lack of definitive safety studies or FDA approval, nonstandard manufacturing practices, plus the potential risk of unknown drug-drug interactions while on inpatient medications, the Pharmacy and Therapeutics Committee does not permit the use of "herbal" or natural products of this type within .   ACTION TAKEN: The pharmacy department is unable to verify this order at this time and your patient has been informed of this safety policy. Please reevaluate patient's clinical condition at discharge and address if the herbal or natural product(s) should be resumed at that time.   

## 2017-06-07 NOTE — Progress Notes (Signed)
PHARMACIST - PHYSICIAN COMMUNICATION  CONCERNING: P&T Medication Policy Regarding Oral Bisphosphonates  RECOMMENDATION: Your order for alendronate (Fosamax), ibandronate (Boniva), or risedronate (Actonel) has been discontinued at this time.  If the patient's post-hospital medical condition warrants safe use of this class of drugs, please resume the pre-hospital regimen upon discharge.  DESCRIPTION:  Alendronate (Fosamax), ibandronate (Boniva), and risedronate (Actonel) can cause severe esophageal erosions in patients who are unable to remain upright at least 30 minutes after taking this medication.   Since brief interruptions in therapy are thought to have minimal impact on bone mineral density, the Pharmacy & Therapeutics Committee has established that bisphosphonate orders should be routinely discontinued during hospitalization.   To override this safety policy and permit administration of Boniva, Fosamax, or Actonel in the hospital, prescribers must write "DO NOT HOLD" in the comments section when placing the order for this class of medications.  Bari Mantis PharmD Clinical Pharmacist 06/07/2017

## 2017-06-07 NOTE — Consult Note (Signed)
Referring Physician: Pershing Proud    Chief Complaint: Dizziness, gait ataxia, confusion  HPI: [name] [name]. is an 83 y.o. male with a history of dementia who on yesterday was travelling back from [place].  Became confused on the plane and remained confused and agitated throughout the evening.  This morning the patient was felt by the wife to be normal but daughters feel he was doing some inconsistent things.  Went out to walk the dog and on returning was off balance and again confused.  EMS was called at that time.  Initial NIHSS of 0.  Date last known well: Date: 06/07/2017 Time last known well: Time: 02:00 tPA Given: No: Resolution of symptoms  Past Medical History:  Diagnosis Date  . CAD (coronary artery disease)    non-obstructive by cath in 2000 - Myoview in 2008 negative. EF 57% - ETT in 8/10. Walked 9:17 on [place] w no CPor ST-T changes  . CAD (coronary artery disease)   . Diabetes mellitus   . GERD (gastroesophageal reflux disease)   . Glaucoma   . Hyperlipidemia   . Iron deficiency anemia   . Memory loss   . Osteoporosis     Past Surgical History:  Procedure Laterality Date  . ANTERIOR CRUCIATE LIGAMENT REPAIR    . Cath (other)    . colon polyps removed     x2  . removal r eye      Family History  Problem Relation Age of Onset  . Stroke Mother   . Heart attack Father   . Arthritis Sister    Social History:  reports that he has never smoked. He has never used smokeless tobacco. He reports that he drinks alcohol. He reports that he does not use drugs.  Allergies:  Allergies  Allergen Reactions  . Hydrocodone-Acetaminophen Rash    Medications: I have reviewed the patient's current medications. Prior to Admission:  Prior to Admission medications   Medication Sig Start Date End Date Taking? Authorizing Provider  aspirin 81 MG tablet Take 81 mg by mouth daily.      [provider]  EVENING PRIMROSE OIL PO Take 1 capsule by mouth daily.    [provider]  galantamine (RAZADYNE) 4 MG tablet Take 8 mg by mouth 2 (two) times daily.     [provider]  glimepiride (AMARYL) 4 MG tablet Take 4 mg by mouth 2 (two) times daily.     [provider]  Glucosamine-Chondroitin 500-400 MG CAPS Take by mouth daily. Take 3 tabs     [provider]  Insulin Detemir (LEVEMIR FLEXPEN) 100 UNIT/ML Pen Inject 15 Units into the skin daily at 10 pm.    [provider]  latanoprost (XALATAN) 0.005 % ophthalmic solution Place 1 drop into the left eye at bedtime.    [provider]  metFORMIN (GLUCOPHAGE) 1000 MG tablet Take 1,000 mg by mouth 2 (two) times daily with a meal.    [provider]  Multiple Vitamins-Iron (MULTIVITAMIN/IRON PO) Take 1 tablet by mouth daily.    [provider]  NITROSTAT 0.4 MG SL tablet TAKE ONE TABLET UNDER THE TONGUE EVERY 5 MINUTES FOR CHEST PAIN. IF NO RELIEF PAST 3RD TAB GO TO EMERGENCY ROOM 03/10/16   Antonieta Iba, MD  Omega-3 Fatty Acids (FISH OIL PO) Take 1 capsule by mouth daily.    [provider]  omeprazole (PRILOSEC) 20 MG capsule Take 20 mg by mouth daily.  [provider]  simvastatin (ZOCOR) 40 MG tablet Take 40 mg by mouth at bedtime.      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 ENT ROS: negative for - epistaxis, nasal discharge, oral lesions, sore throat, tinnitus or vertigo Allergy and Immunology ROS: negative for - hives or itchy/watery eyes Hematological and Lymphatic ROS: negative for - bleeding problems, bruising or swollen lymph nodes Endocrine ROS: negative for - galactorrhea, hair pattern changes, polydipsia/polyuria or temperature  intolerance Respiratory ROS: negative for - cough, hemoptysis, shortness of breath or wheezing Cardiovascular ROS: negative for - chest pain, dyspnea on exertion, edema or irregular heartbeat Gastrointestinal ROS: abdominal pain Genito-Urinary ROS: negative for - dysuria, hematuria, incontinence or urinary frequency/urgency Musculoskeletal ROS: negative for - joint swelling or muscular weakness Neurological ROS: as noted in HPI Dermatological ROS: negative for rash and skin lesion changes  Physical Examination: Blood pressure (!) 124/55, pulse (!) 56, temperature 97.7 F (36.5 C), temperature source Oral, resp. rate 14, height  (1.727 m), weight 74.8 kg (165 lb), SpO2 96 %.  HEENT-  Normocephalic, no lesions, without obvious abnormality.  Normal external eye and conjunctiva.  Normal TM's bilaterally.  Normal auditory canals and external ears. Normal external nose, mucus membranes and septum.  Normal pharynx. Cardiovascular- S1, S2 normal, pulses palpable throughout   Lungs- chest clear, no wheezing, rales, normal symmetric air entry Abdomen- soft, non-tender; bowel sounds normal; no masses,  no organomegaly Extremities- no edema Lymph-no adenopathy palpable Musculoskeletal-no joint tenderness, deformity or swelling Skin-warm and dry, no hyperpigmentation, vitiligo, or suspicious lesions  Neurological Examination   Mental Status: Alert, oriented, thought content appropriate.  Speech fluent without evidence of aphasia.  Able to follow 3 step commands without difficulty. Cranial Nerves: II: Disc flat; Visual fields grossly normal in the left eye, left pupil reactive  III,IV, VI: mild right ptosist, extra-ocular motions intact  V,VII: smile symmetric, facial light touch sensation normal bilaterally VIII: hearing normal bilaterally IX,X: gag reflex present XI: bilateral shoulder shrug XII: midline tongue extension Motor: Right : Upper extremity   5/5    Left:     Upper extremity    5/5  Lower extremity   5/5     Lower extremity   5/5 Tone and bulk:normal tone throughout; no atrophy noted Sensory: Pinprick and light touch intact throughout, bilaterally Deep Tendon Reflexes: 2+ and symmetric with absent lower extremity DTR's bilaterally Plantars: Right: downgoing   Left: downgoing Cerebellar: normal finger-to-nose and normal heel-to-shin testing bilaterally Gait: not tested due to safety concerns    Laboratory Studies:  Basic Metabolic Panel:  Recent Labs Lab 06/07/17 1444  NA 136  K 4.1  CL 103  CO2 25  GLUCOSE 187*  BUN 18  CREATININE 0.80  CALCIUM 10.0    Liver Function Tests:  Recent Labs Lab 06/07/17 1444  AST 26  ALT 26  ALKPHOS 57  BILITOT 0.9  PROT 7.2  ALBUMIN 3.9   No results for input(s): LIPASE, AMYLASE in the last 168 hours. No results for input(s): AMMONIA in the last 168 hours.  CBC:  Recent Labs Lab 06/07/17 1444  WBC 11.5*  NEUTROABS 8.2*  HGB 14.8  HCT 41.9  MCV 86.9  PLT 187    Cardiac Enzymes:  Recent Labs Lab 06/07/17 1444  TROPONINI <0.03    BNP: Invalid input(s): POCBNP  CBG:  Recent Labs Lab 06/07/17 1501 06/07/17 2117  GLUCAP 165* 366*    Microbiology: No results found for this or any previous visit.  Coagulation Studies:  Recent Labs  06/07/17 1444  LABPROT 12.9  INR 0.98    Urinalysis:   Recent Labs Lab 06/07/17 1607  COLORURINE AMBER*  LABSPEC 1.011  PHURINE 6.0  GLUCOSEU 150*  HGBUR NEGATIVE  BILIRUBINUR NEGATIVE  KETONESUR NEGATIVE  PROTEINUR 30*  NITRITE NEGATIVE  LEUKOCYTESUR NEGATIVE    Lipid Panel:    Component Value Date/Time   CHOL 88 06/07/2017 1449   TRIG 66 06/07/2017 1449   HDL 36 (L) 06/07/2017 1449   CHOLHDL 2.4 06/07/2017 1449   VLDL 13 06/07/2017 1449   LDLCALC 39 06/07/2017 1449    HgbA1C: No results found for: HGBA1C  Urine Drug Screen:  No results found for: LABOPIA, COCAINSCRNUR, LABBENZ, AMPHETMU, THCU, LABBARB  Alcohol Level: No  results for input(s): ETH in the last 168 hours.  Other results: EKG: sinus rhythm at 70 bpm.  Imaging: Dg Chest 2 View  Result Date: 06/07/2017 CLINICAL DATA:  83 year old male with a history of right-sided facial droop EXAM: CHEST  2 VIEW COMPARISON:  None. FINDINGS: Cardiomediastinal silhouette unchanged in size and contour. Coarsened interstitial markings. Lateral view demonstrates ill-defined airspace opacity at the posterior lung base. No pneumothorax.  No pleural effusion. IMPRESSION: Airspace opacity at the posterior lung base on the lateral view may reflect developing infection. Correlation with lab results and presentation may be useful. Electronically Signed   By: Gilmer Mor D.O.   On: 06/07/2017 16:36   Ct Head Code Stroke Wo Contrast  Result Date: 06/07/2017 CLINICAL DATA:  Code stroke. Altered level of consciousness, unexplained. Right-sided facial droop. EXAM: CT HEAD WITHOUT CONTRAST TECHNIQUE: Contiguous axial images were obtained from the base of the skull through the vertex without intravenous contrast. COMPARISON:  Brain MRI 03/30/2008 FINDINGS: Brain: No evidence of acute infarction, hemorrhage, hydrocephalus, extra-axial collection or mass lesion/mass effect. Confluent low-density in the cerebral white matter consistent with chronic small vessel ischemia. Cerebral atrophy, mild to moderate for age. Vascular: Atherosclerotic calcification.  No hyperdense vessel. Skull: No acute or aggressive finding Sinuses/Orbits: Right enucleation with prosthesis.  No acute finding Other: These results were called by telephone at the time of interpretation on 06/07/2017 at 2:55 pm to Dr. Lamont Snowball , who verbally acknowledged these results. ASPECTS [facility] Stroke Program Early CT Score) -left hemisphere - Ganglionic level infarction (caudate, lentiform nuclei, internal capsule, insula, M1-M3 cortex): 7 - Supraganglionic infarction (M4-M6 cortex): 3 Total score (0-10 with 10 being normal): 10  IMPRESSION: 1. No acute finding. ASPECTS is 10. 2. Extensive chronic small vessel ischemia in the cerebral white matter. Electronically Signed   By: Marnee Spring M.D.   On: 06/07/2017 14:59    Assessment: 83 y.o. male with a history of dementia presenting with confusion and gait instability.  Patient now at baseline.  It is very likely that the symptoms are related to situational worsening of his cognitive function.  With patient's risk factors will rule out ischemic disease.  On ASA at home.  Stroke Risk Factors - diabetes mellitus and hyperlipidemia  Plan: 1. HgbA1c, fasting lipid panel 2. MRI, MRA  of the brain without contrast.  Would initiate stroke w/u if diagnostic of an acute infarct. 3. PT consult, OT consult, Speech consult 4. Prophylactic therapy-Antiplatelet med: Aspirin -  dose  daily 5. Telemetry monitoring 6. Frequent neuro checks   Case discussed with Dr. Talbert Cage, MD Neurology [phone] 06/07/2017, 11:56 PM

## 2017-06-07 NOTE — ED Provider Notes (Signed)
[facility] Emergency Department Provider Note  ____________________________________________   First MD Initiated Contact with Patient 06/07/17 1432     (approximate)  I have reviewed the triage vital signs and the nursing notes.   HISTORY  Chief Complaint Code Stroke   HPI [name] [name]. is a 83 y.o. male with a history of CAD as well as dementia who is presenting to the emergency department with confusion as well as near syncope and a right facial droop. He just flew back from [place] yesterday and was slightly confused yesterday. However, his symptoms worsened today when he was walking outside and felt nauseous, clammy and weak. He said that he lowered himself to the ground outside in his own yard. He denies ever losing consciousness. Denies any palpitations or chest pain. Family noted her right-sided facial droop. He is complaining of intermittent lower abdominal pain which she describes as a burning type pain. Says that this is been going on over the past 3-4 days. However, he denies any pain at this time. Says that the pain was worse with eating. Denies any blood in his stool.   Past Medical History:  Diagnosis Date  . CAD (coronary artery disease)    non-obstructive by cath in 2000 - Myoview in 2008 negative. EF 57% - ETT in 8/10. Walked 9:17 on [place] w no CPor ST-T changes  . CAD (coronary artery disease)   . Diabetes mellitus   . GERD (gastroesophageal reflux disease)   . Glaucoma   . Hyperlipidemia   . Iron deficiency anemia   . Memory loss   . Osteoporosis     Patient Active Problem List   Diagnosis Date Noted  . PAD (peripheral artery disease) (HCC) 09/07/2014  . Diabetes mellitus (HCC) 09/21/2011  . Hyperlipidemia 03/25/2009  . CAD, NATIVE VESSEL 03/25/2009  . CHEST PAIN-PRECORDIAL 03/25/2009    Past Surgical History:  Procedure Laterality Date  . ANTERIOR CRUCIATE LIGAMENT REPAIR    . Cath (other)    . colon polyps removed       x2  . removal r eye      Prior to Admission medications   Medication Sig Start Date End Date Taking? Authorizing Provider  alendronate (FOSAMAX) 70 MG tablet Take 70 mg by mouth once a week. Take with a full glass of water on an empty stomach.   Yes [provider]  aspirin 81 MG tablet Take 81 mg by mouth daily.     Yes [provider]  EVENING PRIMROSE OIL PO Take 1 capsule by mouth daily.   Yes [provider]  galantamine (RAZADYNE) 12 MG tablet Take 12 mg by mouth 2 (two) times daily.    Yes [provider]  glimepiride (AMARYL) 4 MG tablet Take 4 mg by mouth 2 (two) times daily.    Yes [provider]  Glucosamine-Chondroitin 500-400 MG CAPS Take by mouth daily. Take 3 tabs    Yes [provider]  Insulin Detemir (LEVEMIR FLEXPEN) 100 UNIT/ML Pen Inject 20 Units into the skin daily at 10 pm.    Yes [provider]  latanoprost (XALATAN) 0.005 % ophthalmic solution Place 1 drop into the left eye at bedtime.   Yes [provider]  metFORMIN (GLUCOPHAGE) 1000 MG tablet Take 1,000 mg by mouth 2 (two) times daily with a meal.   Yes [provider]  Multiple Vitamins-Iron (MULTIVITAMIN/IRON PO) Take 1 tablet by mouth daily.   Yes [provider]  NITROSTAT 0.4 MG SL tablet TAKE ONE TABLET UNDER THE TONGUE EVERY 5 MINUTES FOR CHEST PAIN. IF NO RELIEF PAST 3RD TAB GO TO EMERGENCY ROOM 03/10/16  Yes Gollan, Tollie Pizza, MD  Omega-3 Fatty Acids (FISH OIL PO) Take 1 capsule by mouth daily.   Yes [provider]  omeprazole (PRILOSEC) 20 MG capsule Take 20 mg by mouth daily.     Yes [provider]  simvastatin (ZOCOR) 40 MG tablet Take 40 mg by mouth at bedtime.     Yes [provider]    Allergies Hydrocodone-acetaminophen  Family History  Problem Relation Age of Onset  . Stroke Mother   . Heart attack Father   . Arthritis Sister     Social History Social History   Substance Use Topics  . Smoking status: Never Smoker  . Smokeless tobacco: Never Used  . Alcohol use 0.0 - 0.6 oz/week    Review of Systems  Constitutional: No fever/chills Eyes: No visual changes. ENT: No sore throat. Cardiovascular: Denies chest pain. Respiratory: Denies shortness of breath. Gastrointestinal:no vomiting.  No diarrhea.  No constipation. Genitourinary: Negative for dysuria. Musculoskeletal: Negative for back pain. Skin: Negative for rash. Neurological: Negative for headaches, focal weakness or numbness.   ____________________________________________   PHYSICAL EXAM:  VITAL SIGNS: ED Triage Vitals  Enc Vitals Group     BP 06/07/17 1427 139/70     Pulse Rate 06/07/17 1427 80     Resp 06/07/17 1427 15     Temp 06/07/17 1505 (!) 97.5 F (36.4 C)     Temp src --      SpO2 06/07/17 1427 98 %     Weight 06/07/17 1427 165 lb (74.8 kg)     Height 06/07/17 1427  (1.727 m)     Head Circumference --      Peak Flow --      Pain Score --      Pain Loc --      Pain Edu? --      Excl. in GC? --     Constitutional: Alert and oriented. Well appearing and in no acute distress. Eyes: Conjunctivae are normal.  Head: Atraumatic. Nose: No congestion/rhinnorhea. Mouth/Throat: Mucous membranes are moist.  Neck: No stridor.   Cardiovascular: Normal rate, regular rhythm. Grossly normal heart sounds.  Good peripheral circulation. Respiratory: Normal respiratory effort.  No retractions. Lungs CTAB. Gastrointestinal: Soft and nontender. No distention. No CVA tenderness. Musculoskeletal: No lower extremity tenderness nor edema.  No joint effusions. Neurologic:  Normal speech and language. Very subtle right-sided facial droop which the family says is new. Patient able to ambulate with a normal gait unassisted. Skin:  Skin is warm, dry and intact. No rash noted. Psychiatric: Mood and affect are normal. Speech and behavior are normal.  NIH Stroke Scale   Person  Administering Scale: Arelia Longest  Administer stroke scale items in the order listed. Record performance in each category after each subscale exam. Do not go back and change scores. Follow directions provided for each exam technique. Scores should reflect what the patient does, not what the clinician thinks the patient can do. The clinician should record answers while administering the exam and work quickly. Except where indicated, the patient should not be coached (i.e., repeated requests to patient to make a special effort).   1a  Level of consciousness: 0=alert; keenly responsive  1b. LOC questions:  0=Performs both tasks correctly  1c. LOC commands: 0=Performs both tasks correctly  2.  Best Gaze: 0=normal  3.  Visual: 0=No visual loss  4. Facial Palsy: 1  5a.  Motor left arm: 0=No drift, limb holds 90 (or 45) degrees for full 10 seconds  5b.  Motor right arm: 0=No drift, limb holds 90 (or 45) degrees for full 10 seconds  6a. motor left leg: 0=No drift, limb holds 90 (or 45) degrees for full 10 seconds  6b  Motor right leg:  0=No drift, limb holds 90 (or 45) degrees for full 10 seconds  7. Limb Ataxia: 0=Absent  8.  Sensory: 0=Normal; no sensory loss  9. Best Language:  0=No aphasia, normal  10. Dysarthria: 0=Normal  11. Extinction and Inattention: 0=No abnormality  12. Distal motor function: 0=Normal   Total:   1    ____________________________________________   LABS (all labs ordered are listed, but only abnormal results are displayed)  Labs Reviewed  CBC - Abnormal; Notable for the following:       Result Value   WBC 11.5 (*)    All other components within normal limits  DIFFERENTIAL - Abnormal; Notable for the following:    Neutro Abs 8.2 (*)    All other components within normal limits  COMPREHENSIVE METABOLIC PANEL - Abnormal; Notable for the following:    Glucose, Bld 187 (*)    All other components within normal limits  GLUCOSE, CAPILLARY - Abnormal; Notable  for the following:    Glucose-Capillary 165 (*)    All other components within normal limits  PROTIME-INR  APTT  TROPONIN I  CBG MONITORING, ED   ____________________________________________  EKG  ED ECG REPORT I, Arelia Longest, the attending physician, personally viewed and interpreted this ECG.   Date: 06/07/2017  EKG Time: 1421  Rate: 70  Rhythm: normal sinus rhythm  Axis: Normal  Intervals:none  ST&T Change: No ST segment elevation or depression. No abnormal T-wave inversion.  ____________________________________________  RADIOLOGY  No acute finding on the CT of the brain. ____________________________________________   PROCEDURES  Procedure(s) performed:   Procedures  Critical Care performed:   ____________________________________________   INITIAL IMPRESSION / ASSESSMENT AND PLAN / ED COURSE  Pertinent labs & imaging results that were available during my care of the patient were reviewed by me and considered in my medical decision making (see chart for details).   ----------------------------------------- 3:53 PM on 06/07/2017 -----------------------------------------  Patient initially made a stroke alert and was dilated by Dr. Thad Ranger. Per her exam the patient has an NIH score of 0. She does not believe him to be a TPA candidate at this time. However she does recommend further workup. Multiple possible triggering factors including the patient's dementia as well as possible CVA. Patient will be admitted to the hospital for further workup. Family and patient understand the plan and willing to comply.      ____________________________________________   FINAL CLINICAL IMPRESSION(S) / ED DIAGNOSES  Facial droop. Delirium.  Near syncope    NEW MEDICATIONS STARTED DURING THIS VISIT:  New Prescriptions   No medications on file     Note:  This document was prepared using Dragon voice recognition software and may include unintentional  dictation errors.     Myrna Blazer, MD 06/07/17 [id number]

## 2017-06-07 NOTE — ED Notes (Signed)
Patient transported to CT by [facility], EDT.  

## 2017-06-08 ENCOUNTER — Observation Stay (HOSPITAL_BASED_OUTPATIENT_CLINIC_OR_DEPARTMENT_OTHER)
Admit: 2017-06-08 | Discharge: 2017-06-08 | Disposition: A | Discharge status: Home / Self Care | Payer: Medicare Other | Attending: Internal Medicine | Admitting: Internal Medicine

## 2017-06-08 ENCOUNTER — Observation Stay: Payer: Medicare Other

## 2017-06-08 DIAGNOSIS — I5030 Unspecified diastolic (congestive) heart failure: Secondary | ICD-10-CM

## 2017-06-08 DIAGNOSIS — R410 Disorientation, unspecified: Secondary | ICD-10-CM | POA: Diagnosis not present

## 2017-06-08 LAB — BASIC METABOLIC PANEL
Anion gap: 7 (ref 5–15)
BUN: 14 mg/dL (ref 6–20)
CO2: 26 mmol/L (ref 22–32)
CREATININE: 0.74 mg/dL (ref 0.61–1.24)
Calcium: 9.6 mg/dL (ref 8.9–10.3)
Chloride: 107 mmol/L (ref 101–111)
GFR calc Af Amer: >60 mL/min (ref >60)
GLUCOSE: 131 mg/dL — AB (ref 65–99)
POTASSIUM: 3.7 mmol/L (ref 3.5–5.1)
SODIUM: 140 mmol/L (ref 135–145)

## 2017-06-08 LAB — CBC
HEMATOCRIT: 43.1 % (ref 40.0–52.0)
Hemoglobin: 15.1 g/dL (ref 13.0–18.0)
MCH: 30.2 pg (ref 26.0–34.0)
MCHC: 35.1 g/dL (ref 32.0–36.0)
MCV: 85.9 fL (ref 80.0–100.0)
PLATELETS: 171 10*3/uL (ref 150–440)
RBC: 5.01 MIL/uL (ref 4.40–5.90)
RDW: 13.4 % (ref 11.5–14.5)
WBC: 6.3 10*3/uL (ref 3.8–10.6)

## 2017-06-08 LAB — ECHOCARDIOGRAM COMPLETE
Height: 68 in
WEIGHTICAEL: 2640 [oz_av]

## 2017-06-08 LAB — HEMOGLOBIN A1C
HEMOGLOBIN A1C: 7.8 % — AB (ref 4.8–5.6)
MEAN PLASMA GLUCOSE: 177.16 mg/dL

## 2017-06-08 NOTE — Evaluation (Signed)
Physical Therapy Evaluation Patient Details Name: [name] [name]. MRN: [id number] DOB: 1934/02/16 Today's Date: 06/08/2017   History of Present Illness  presented to ER secondary to weakness, dizziness and fall prior to admission; admitted with generalized weakness and to rule out TIA/CVA.  All imaging to date negative for current change/injury.  Clinical Impression  Upon evaluation, patient alert and oriented; follows commands and demonstrates good effort with all functional tasks.  Patient very active at baseline; eager for OOB activity as tolerated.  Bilat UE/LE strength and ROM grossly symmetrical and WFL; no focal weakness, sensory deficit appreciated. Mild delay in coordination bilat UEs, but symmetrical between extremities.  Demonstrates ability to complete bed mobility with mod indep; sit/stand, basic transfers and gait (220') without assist device, cga/min assist.  Noted instability with dynamic gait components, demonstrating increased sway with staggered, choppy steps.  Constant cuing for upward gaze, postural extension to prevent excessive forward weight shift (slightly festinating).  Gait performance optimized with use of RW; Recommend continued use of RW for all mobility at this time.  Patient/family voiced understanding and agreement with plan. Family with questions regarding walking dog, swimming and driving upon discharge.  Discouraged patient from walking dog at this time due to external shifting/perturbations associated with dog pulling/changing directions (and patient's limited balance reactions); referred back to physician for instruction regarding swimming and driving. Would benefit from skilled PT to address above deficits and promote optimal return to PLOF; recommend transition to home with RW and outpatient PT follow up.    Follow Up Recommendations Outpatient PT    Equipment Recommendations  Rolling walker with 5" wheels    Recommendations for Other Services        Precautions / Restrictions Precautions Precautions: Fall Restrictions Weight Bearing Restrictions: No      Mobility  Bed Mobility Overal bed mobility: Modified Independent                Transfers Overall transfer level: Needs assistance Equipment used: None Transfers: Sit to/from Stand Sit to Stand: Min guard         General transfer comment: fair/good LE strength/power with movement transition  Ambulation/Gait Ambulation/Gait assistance: Min guard;Min assist Ambulation Distance (Feet): 220 Feet Assistive device: None       General Gait Details: broad BOS with somewhat staggered steps at times, mild sway/deviation with head turns, changes of direction (mild scissoring with turns). Cuing for upward gaze and postural extension (tendancy for excessive forward weight shift and mild anterior LOB at timeS)  Stairs            Wheelchair Mobility    Modified Rankin (Stroke Patients Only)       Balance Overall balance assessment: Needs assistance Sitting-balance support: No upper extremity supported;Feet supported Sitting balance-Leahy Scale: Good     Standing balance support: No upper extremity supported Standing balance-Leahy Scale: Fair                               Pertinent Vitals/Pain Pain Assessment: No/denies pain    Home Living Family/patient expects to be discharged to:: Private residence Living Arrangements: Spouse/significant other Available Help at Discharge: Family Type of Home: House Home Access: Stairs to enter Entrance Stairs-Rails: Left Entrance Stairs-Number of Steps: 5 Home Layout: One level        Prior Function Level of Independence: Independent         Comments: Indep with ADLs, household and community mobility  without assist device; active, walking 3 miles/day and swimming several times a week.  Just returned from 10-day trip to visit son in [place]     Hand Dominance   Dominant Hand: Right     Extremity/Trunk Assessment   Upper Extremity Assessment Upper Extremity Assessment: Overall WFL for tasks assessed (strength grossly 4+ to 5/5 throughout; denies sensory deficit; mild delay in speed of movement, coordination bilat)    Lower Extremity Assessment Lower Extremity Assessment:  (strength grossly 4+/5 throughout, no focal weakness; denies sensory deficit;)       Communication   Communication: HOH  Cognition Arousal/Alertness: Awake/alert Behavior During Therapy: WFL for tasks assessed/performed Overall Cognitive Status: Within Functional Limits for tasks assessed                                        General Comments      Exercises Other Exercises Other Exercises: 100' with SPC, cga/min assist-fair sequencing, but continued episodes of instability Other Exercises: 100' with RW, sup-noted improvement in gait mechanics, fluidity, symmetry and overall safety/stability; patient subjectively voicing optimal comfort/confidence with RW.  Recommend continued use of RW for all mobility at this time.   Assessment/Plan    PT Assessment Patient needs continued PT services  PT Problem List Decreased strength;Decreased activity tolerance;Decreased balance;Decreased mobility;Decreased coordination;Decreased cognition;Decreased knowledge of use of DME;Decreased safety awareness;Decreased knowledge of precautions       PT Treatment Interventions DME instruction;Gait training;Stair training;Functional mobility training;Therapeutic activities;Therapeutic exercise;Balance training;Patient/family education    PT Goals (Current goals can be found in the Care Plan section)  Acute Rehab PT Goals Patient Stated Goal: to return home PT Goal Formulation: With patient/family Time For Goal Achievement: 06/22/17 Potential to Achieve Goals: Good    Frequency Min 2X/week   Barriers to discharge        Co-evaluation               AM-PAC PT "6 Clicks" Daily  Activity  Outcome Measure Difficulty turning over in bed (including adjusting bedclothes, sheets and blankets)?: None Difficulty moving from lying on back to sitting on the side of the bed? : None Difficulty sitting down on and standing up from a chair with arms (e.g., wheelchair, bedside commode, etc,.)?: Unable Help needed moving to and from a bed to chair (including a wheelchair)?: A Little Help needed walking in hospital room?: A Little Help needed climbing 3-5 steps with a railing? : A Little 6 Click Score: 18    End of Session Equipment Utilized During Treatment: Gait belt Activity Tolerance: Patient tolerated treatment well Patient left: in bed;with call bell/phone within reach;with bed alarm set;with family/visitor present Nurse Communication: Mobility status PT Visit Diagnosis: Difficulty in walking, not elsewhere classified (R26.2);Unsteadiness on feet (R26.81)    Time: 1610-9604 PT Time Calculation (min) (ACUTE ONLY): 30 min   Charges:   PT Evaluation $PT Eval Low Complexity: 1 Low PT Treatments $Gait Training: 8-22 mins   PT G Codes:   PT G-Codes **NOT FOR INPATIENT CLASS** Functional Assessment Tool Used: AM-PAC 6 Clicks Basic Mobility Functional Limitation: Mobility: Walking and moving around Mobility: Walking and Moving Around Current Status (V4098): At least 20 percent but less than 40 percent impaired, limited or restricted Mobility: Walking and Moving Around Goal Status [id number]): At least 1 percent but less than 20 percent impaired, limited or restricted    [name] [name], PT, DPT, NCS  06/08/17, 12:28 PM [phone]

## 2017-06-08 NOTE — Progress Notes (Signed)
*  PRELIMINARY RESULTS* Echocardiogram 2D Echocardiogram has been performed.  [name] [name] 06/08/2017, 2:03 PM

## 2017-06-08 NOTE — Progress Notes (Signed)
Pt for discharge home alert. No resp distress. Sl d/cd.   Instructions discussed with pts  Wife and dtr. meds / diet / activity and f/u  Discussed.  moniter d/cd. No voiced  C/o/ verbalized understanding of discharge plans.home via w/c at this time

## 2017-06-08 NOTE — Care Management Obs Status (Signed)
MEDICARE OBSERVATION STATUS NOTIFICATION   Patient Details  Name: [name] [name]. MRN: [id number] Date of Birth: November 21, 1933   Medicare Observation Status Notification Given:  Yes Notice signed, one given to patient and the other to HIM for scanning    Eber Hong, RN 06/08/2017, 9:18 AM

## 2017-06-08 NOTE — Progress Notes (Signed)
Inpatient Diabetes Program Recommendations  AACE/ADA: New Consensus Statement on Inpatient Glycemic Control (2015)  Target Ranges:  Prepandial:   less than 140 mg/dL      Peak postprandial:   less than 180 mg/dL (1-2 hours)      Critically ill patients:  140 - 180 mg/dL  Results for RHONDA, VANGIESON (MRN [id number]) as of 06/08/2017 08:38  Ref. Range 06/07/2017 15:01 06/07/2017 21:17  Glucose-Capillary Latest Ref Range: 65 - 99 mg/dL 409 (H) 811 (H)   Results for CHACE, KLIPPEL (MRN [id number]) as of 06/08/2017 08:38  Ref. Range 06/07/2017 14:44 06/07/2017 14:49 06/08/2017 04:20  Glucose Latest Ref Range: 65 - 99 mg/dL 213 (H)  086 (H)  Hemoglobin A1C Latest Ref Range: 4.8 - 5.6 %  7.8 (H)    Review of Glycemic Control  Diabetes history: DM2 Outpatient Diabetes medications: Levemir 20 units QHS, Amaryl 4 mg BID, Metformin 1000 mg BID Current orders for Inpatient glycemic control: Levemir 20 units QHS, Amaryl 4 mg BID, Metformin 1000 mg BID  Inpatient Diabetes Program Recommendations: Correction (SSI): While inpatient, please consider ordering CBGs with Novolog 0-9 units TID with meals.  Thanks, Orlando Penner, RN, MSN, CDE Diabetes Coordinator Inpatient Diabetes Program [phone] (Team Pager from 8am to 5pm)

## 2017-06-08 NOTE — Care Management (Addendum)
Placed in observation for sx concerning for cva.  Neurology has consulted.  Head CT and MRI negative for acute findings.  PT and OT consults pending.  Ambulates without assistive device but does have access to a walker if needed. Independent in all adls, denies issues accessing medical care, obtaining medications or with transportation.  Current with  PCP.

## 2017-06-08 NOTE — Evaluation (Signed)
Occupational Therapy Evaluation Patient Details Name: [name] [name]. MRN: [id number] DOB: Aug 29, 1934 Today's Date: 06/08/2017    History of Present Illness Pt. is an 83 y.o. male who was admitted to [facility] for possible TIA/CVA secondary to weakness, dizziness, and a fall. Imaging has been negative for injury or change.   Clinical Impression   Pt. Is an 83 y.o. male who was admitted to [facility] for possible TIA/CVA. Pt. resides at home with his wife. Pt. was previously independent with ADLs, and IADLs prior to onset of CVA. Pt. Was able to drive, walked several miles a day, and very recently travelled to [place] for 10 days to visit his son. Pt. and family reports pt. BUE functioning is at his baseline, is not hindering his ability to complete ADL care. Pt. Plans to return home with his wife. Pt., and wife plan to assist pt. with ADL/IADLs if needed. No further OT services are warranted at this time. Pt. and wife are very eager to return home so that his wife can take take care of some issues with her telephone.    Follow Up Recommendations  No OT follow up    Equipment Recommendations       Recommendations for Other Services       Precautions / Restrictions Precautions Precautions: Fall Restrictions Weight Bearing Restrictions: No             ADL either performed or assessed with clinical judgement   ADL Overall ADL's : Needs assistance/impaired Eating/Feeding: Set up   Grooming: Set up   Upper Body Bathing: Set up   Lower Body Bathing: Min guard   Upper Body Dressing : Set up   Lower Body Dressing: Min guard               Functional mobility during ADLs: Min guard       Vision Baseline Vision/History: Wears glasses (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.) Patient Visual Report: No change from baseline       Perception     Praxis      Pertinent Vitals/Pain Pain Assessment: No/denies pain     Hand Dominance Right   Extremity/Trunk  Assessment Upper Extremity Assessment Upper Extremity Assessment: Overall WFL for tasks assessed       Communication Communication Communication: HOH   Cognition Arousal/Alertness: Awake/alert Behavior During Therapy: WFL for tasks assessed/performed Overall Cognitive Status: Within Functional Limits for tasks assessed                                     General Comments       Exercises   Shoulder Instructions      Home Living Family/patient expects to be discharged to:: Private residence Living Arrangements: Spouse/significant other Available Help at Discharge: Family Type of Home: House Home Access: Stairs to enter Secretary/administrator of Steps: 5 Entrance Stairs-Rails: Left Home Layout: One level                          Prior Functioning/Environment Level of Independence: Independent        Comments: Pt. was independent with ADLs, and IADLs, and driving. Pt. has just returned forom a 10 day trip to [place] to visit his son.        OT Problem List: Decreased strength      OT Treatment/Interventions:  OT Goals(Current goals can be found in the care plan section) Acute Rehab OT Goals Patient Stated Goal: To return home  OT Goal Formulation: With patient Potential to Achieve Goals: Good  OT Frequency:     Barriers to D/C:            Co-evaluation              AM-PAC PT "6 Clicks" Daily Activity     Outcome Measure Help from another person eating meals?: None Help from another person taking care of personal grooming?: None Help from another person toileting, which includes using toliet, bedpan, or urinal?: None Help from another person bathing (including washing, rinsing, drying)?: A Little Help from another person to put on and taking off regular upper body clothing?: None Help from another person to put on and taking off regular lower body clothing?: A Little 6 Click Score: 22   End of Session    Activity  Tolerance: Patient tolerated treatment well Patient left:    OT Visit Diagnosis: Muscle weakness (generalized) (M62.81)                Time: 4098-1191 OT Time Calculation (min): 23 min Charges:  OT General Charges $OT Visit: 1 Visit OT Evaluation $OT Eval Moderate Complexity: 1 Mod G-Codes: OT G-codes **NOT FOR INPATIENT CLASS** Functional Limitation: Self care Self Care Current Status (Y7829): At least 1 percent but less than 20 percent impaired, limited or restricted Self Care Goal Status (F6213): 0 percent impaired, limited or restricted   Olegario Messier, MS, OTR/L   Olegario Messier, MS, OTR/L 06/08/2017, 3:24 PM

## 2017-06-08 NOTE — Discharge Summary (Signed)
[name] [name]., is a 83 y.o. male  DOB 1934/07/29  MRN [id number].  Admission date:  06/07/2017  Admitting Physician  Altamese Dilling, MD  Discharge Date:  06/08/2017   Primary MD  Danella Penton, MD  Recommendations for primary care physician for things to follow:   Follow-up with PCP in one week   Admission Diagnosis  Delirium [R41.0] Facial droop [R29.810] Cerebral infarction (HCC) [I63.9] CVA (cerebral infarction) [I63.9] Near syncope [R55]   Discharge Diagnosis  Delirium [R41.0] Facial droop [R29.810] Cerebral infarction (HCC) [I63.9] CVA (cerebral infarction) [I63.9] Near syncope [R55]    Principal Problem:   Weakness      Past Medical History:  Diagnosis Date  . CAD (coronary artery disease)    non-obstructive by cath in 2000 - Myoview in 2008 negative. EF 57% - ETT in 8/10. Walked 9:17 on [place] w no CPor ST-T changes  . CAD (coronary artery disease)   . Diabetes mellitus   . GERD (gastroesophageal reflux disease)   . Glaucoma   . Hyperlipidemia   . Iron deficiency anemia   . Memory loss   . Osteoporosis     Past Surgical History:  Procedure Laterality Date  . ANTERIOR CRUCIATE LIGAMENT REPAIR    . Cath (other)    . colon polyps removed     x2  . removal r eye         History of present illness and  Hospital Course:     Kindly see H&P for history of present illness and admission details, please review complete Labs, Consult reports and Test reports for all details in brief  HPI  from the history and physical done on the day of admission 83 year old patient with history of coronary artery disease, GERD, glaucoma, hyperlipidemia brought in because of dizziness, ambulatory difficulty which happened after a long flight from [place] 2 days ago. Patient did not sleep well in the flight  and next morning the patient went for 3 mile walk in hot and humid weather when he came home wife found him on the scalp and he also was confused. Patient family noticed that he was little confused after he got off the plane from [place].   Hospital Course   #1 transient dizziness and fatigue likely secondary to heat exhaustion: Patient says that he feels much better today. Physical therapy recommended that he can walk or go to swimming at the [facility] with supervision off one of the family members. Patient feels much better today. #2 confusion likely related to dementia: neuro  consult pending. Initially called in as a "stroke and admitted for evaluation of stroke. So far stroke workup has been negative. MRI of the brain did not show any acute strokes. Ultrasound of carotids showed no hemodynamically significant stenosis. Echocardiogram is pending. Patient has been very active and he walks around 3 miles a day and also goes to [facility] for swimming.  #3 history of coronary artery disease; continue aspirin, simvastatin, omega-3 fatty acids.  #4 diabetes mellitus type 2; patient is on metformin, Levemir, Amaryl. Blood sugars are stable.   #5 GERD; continue PPIs    Discharge Condition: stable   Follow UP  Follow-up Information    Danella Penton, MD Follow up in 1 week(s).   Specialty:  Internal Medicine Contact information: [phone] [facility] [address] [facility] [place] [address] [phone]             Discharge Instructions  and  Discharge Medications  Allergies as of 06/08/2017      Reactions   Hydrocodone-acetaminophen Rash      Medication List    STOP taking these medications   alendronate 70 MG tablet Commonly known as:  FOSAMAX     TAKE these medications   aspirin 81 MG tablet Take 81 mg by mouth daily.   EVENING PRIMROSE OIL PO Take 1 capsule by mouth daily.   FISH OIL PO Take 1 capsule by mouth daily.   galantamine 12 MG  tablet Commonly known as:  RAZADYNE Take 12 mg by mouth 2 (two) times daily.   glimepiride 4 MG tablet Commonly known as:  AMARYL Take 4 mg by mouth 2 (two) times daily.   Glucosamine-Chondroitin 500-400 MG Caps Take by mouth daily. Take 3 tabs   latanoprost 0.005 % ophthalmic solution Commonly known as:  XALATAN Place 1 drop into the left eye at bedtime.   LEVEMIR FLEXPEN 100 UNIT/ML Pen Generic drug:  Insulin Detemir Inject 20 Units into the skin daily at 10 pm.   metFORMIN 1000 MG tablet Commonly known as:  GLUCOPHAGE Take 1,000 mg by mouth 2 (two) times daily with a meal.   MULTIVITAMIN/IRON PO Take 1 tablet by mouth daily.   NITROSTAT 0.4 MG SL tablet Generic drug:  nitroGLYCERIN TAKE ONE TABLET UNDER THE TONGUE EVERY 5 MINUTES FOR CHEST PAIN. IF NO RELIEF PAST 3RD TAB GO TO EMERGENCY ROOM   omeprazole 20 MG capsule Commonly known as:  PRILOSEC Take 20 mg by mouth daily.   simvastatin 40 MG tablet Commonly known as:  ZOCOR Take 40 mg by mouth at bedtime.            Durable Medical Equipment        Start     Ordered   06/08/17 1237  For home use only DME Walker rolling  Once    Question:  Patient needs a walker to treat with the following condition  Answer:  Weakness   06/08/17 1236        Diet and Activity recommendation: See Discharge Instructions above   Consults obtained - neurology,PT   Major procedures and Radiology Reports - PLEASE review detailed and final reports for all details, in brief -     Dg Chest 2 View  Result Date: 06/07/2017 CLINICAL DATA:  83 year old male with a history of right-sided facial droop EXAM: CHEST  2 VIEW COMPARISON:  None. FINDINGS: Cardiomediastinal silhouette unchanged in size and contour. Coarsened interstitial markings. Lateral view demonstrates ill-defined airspace opacity at the posterior lung base. No pneumothorax.  No pleural effusion. IMPRESSION: Airspace opacity at the posterior lung base on the  lateral view may reflect developing infection. Correlation with lab results and presentation may be useful. Electronically Signed   By: Gilmer Mor D.O.   On: 06/07/2017 16:36   Mr Brain Wo Contrast  Result Date: 06/08/2017 CLINICAL DATA:  Ataxia for 2 days, fell today. Recent trip determine knee, insufficient sleep. Assess for stroke. History of hyperlipidemia, diabetes. EXAM: MRI HEAD WITHOUT CONTRAST TECHNIQUE: Multiplanar, multiecho pulse sequences of the brain and surrounding structures were obtained without intravenous contrast. COMPARISON:  CT HEAD June 07, 2017 and MRI of the head March 30, 2008 FINDINGS: BRAIN: No reduced diffusion to suggest acute ischemia. Scattered predominantly central chronic micro hemorrhages. Confluent supratentorial and patchy pontine white matter FLAIR T2 hyperintensities. Patchy T2 hyperintensities in the basal ganglia and thalamus seen with chronic small vessel ischemic disease. Old small LEFT cerebellar infarct. Moderate to  severe ventriculomegaly on the basis of global parenchymal brain volume loss. No midline shift, mass effect or masses. No abnormal extra-axial fluid collections. VASCULAR: Normal major intracranial vascular flow voids present at skull base. SKULL AND UPPER CERVICAL SPINE: No abnormal sellar expansion. No suspicious calvarial bone marrow signal. Craniocervical junction maintained. SINUSES/ORBITS: Trace paranasal sinus mucosal thickening. Mastoid air cells are well aerated. RIGHT globe prosthesis. OTHER: None. IMPRESSION: 1. No acute intracranial process. 2. Moderate to severe chronic small vessel ischemic disease. Old small LEFT cerebellar infarct. 3. Moderate to severe parenchymal brain volume loss. Electronically Signed   By: Awilda Metro M.D.   On: 06/08/2017 04:28   US Carotid Bilateral  Result Date: 06/08/2017 CLINICAL DATA:  83 year old male with a history of TIA. Cardiovascular risk factors include diabetes EXAM: BILATERAL CAROTID DUPLEX  ULTRASOUND TECHNIQUE: Wallace Cullens scale imaging, color Doppler and duplex ultrasound were performed of bilateral carotid and vertebral arteries in the neck. COMPARISON:  08/31/2015 FINDINGS: Criteria: Quantification of carotid stenosis is based on velocity parameters that correlate the residual internal carotid diameter with NASCET-based stenosis levels, using the diameter of the distal internal carotid lumen as the denominator for stenosis measurement. The following velocity measurements were obtained: RIGHT ICA:  Systolic 80 cm/sec, Diastolic 19 cm/sec CCA:  87 cm/sec SYSTOLIC ICA/CCA RATIO:  0.9 ECA:  81 cm/sec LEFT ICA:  Systolic 87 cm/sec, Diastolic 23 cm/sec CCA:  87 cm/sec SYSTOLIC ICA/CCA RATIO:  1.0 ECA:  90 cm/sec Right Brachial SBP: Not acquired Left Brachial SBP: Not acquired RIGHT CAROTID ARTERY: No significant calcifications of the right common carotid artery. Intermediate waveform maintained. Heterogeneous and partially calcified plaque at the right carotid bifurcation. No significant lumen shadowing. Low resistance waveform of the right ICA. No significant tortuosity. RIGHT VERTEBRAL ARTERY: Antegrade flow with low resistance waveform. LEFT CAROTID ARTERY: No significant calcifications of the left common carotid artery. Intermediate waveform maintained. Heterogeneous and partially calcified plaque at the left carotid bifurcation without significant lumen shadowing. Low resistance waveform of the left ICA. No significant tortuosity. LEFT VERTEBRAL ARTERY:  Antegrade flow with low resistance waveform. IMPRESSION: Color duplex indicates minimal heterogeneous and calcified plaque, with no hemodynamically significant stenosis by duplex criteria in the extracranial cerebrovascular circulation. Signed, Yvone Neu. Loreta Ave, DO [facility] Electronically Signed   By: Gilmer Mor D.O.   On: 06/08/2017 07:09   Ct Head Code Stroke Wo Contrast  Result Date:  06/07/2017 CLINICAL DATA:  Code stroke. Altered level of consciousness, unexplained. Right-sided facial droop. EXAM: CT HEAD WITHOUT CONTRAST TECHNIQUE: Contiguous axial images were obtained from the base of the skull through the vertex without intravenous contrast. COMPARISON:  Brain MRI 03/30/2008 FINDINGS: Brain: No evidence of acute infarction, hemorrhage, hydrocephalus, extra-axial collection or mass lesion/mass effect. Confluent low-density in the cerebral white matter consistent with chronic small vessel ischemia. Cerebral atrophy, mild to moderate for age. Vascular: Atherosclerotic calcification.  No hyperdense vessel. Skull: No acute or aggressive finding Sinuses/Orbits: Right enucleation with prosthesis.  No acute finding Other: These results were called by telephone at the time of interpretation on 06/07/2017 at 2:55 pm to Dr. Lamont Snowball , who verbally acknowledged these results. ASPECTS [facility] Stroke Program Early CT Score) -left hemisphere - Ganglionic level infarction (caudate, lentiform nuclei, internal capsule, insula, M1-M3 cortex): 7 - Supraganglionic infarction (M4-M6 cortex): 3 Total score (0-10 with 10 being normal): 10 IMPRESSION: 1. No acute finding. ASPECTS is 10. 2. Extensive chronic small vessel ischemia in the cerebral white matter. Electronically Signed  By: Marnee Spring M.D.   On: 06/07/2017 14:59    Micro Results     No results found for this or any previous visit (from the past 240 hour(s)).     Today   Subjective:   [name] [name] today has no headache,no chest abdominal pain,no new weakness tingling or numbness, feels much better wants to go home today.   Objective:   Blood pressure 131/68, pulse 85, temperature (!) 97.5 F (36.4 C), temperature source Axillary, resp. rate 16, height  (1.727 m), weight 74.8 kg (165 lb), SpO2 98 %.   Intake/Output Summary (Last 24 hours) at 06/08/17 1237 Last data filed at 06/08/17 1205  Gross per 24 hour  Intake              2240 ml  Output             3000 ml  Net             -760 ml    Exam Awake Alert, Oriented x 3, No new F.N deficits, Normal affect [place].AT,PERRAL Supple Neck,No JVD, No cervical lymphadenopathy appriciated.  Symmetrical Chest wall movement, Good air movement bilaterally, CTAB RRR,No Gallops,Rubs or new Murmurs, No Parasternal Heave +ve B.Sounds, Abd Soft, Non tender, No organomegaly appriciated, No rebound -guarding or rigidity. No Cyanosis, Clubbing or edema, No new Rash or bruise  Data Review   CBC w Diff: Lab Results  Component Value Date   WBC 6.3 06/08/2017   HGB 15.1 06/08/2017   HGB 12.9 (L) 10/16/2011   HCT 43.1 06/08/2017   HCT 39.9 (L) 10/16/2011   PLT 171 06/08/2017   PLT 192 10/16/2011   LYMPHOPCT 20 06/07/2017   MONOPCT 8 06/07/2017   EOSPCT 1 06/07/2017   BASOPCT 0 06/07/2017    CMP: Lab Results  Component Value Date   NA 140 06/08/2017   NA 141 10/16/2011   K 3.7 06/08/2017   K 4.2 10/16/2011   CL 107 06/08/2017   CL 104 10/16/2011   CO2 26 06/08/2017   CO2 29 10/16/2011   BUN 14 06/08/2017   BUN 12 10/16/2011   CREATININE 0.74 06/08/2017   CREATININE 0.73 10/16/2011   PROT 7.2 06/07/2017   PROT 7.8 10/16/2011   ALBUMIN 3.9 06/07/2017   ALBUMIN 4.1 10/16/2011   BILITOT 0.9 06/07/2017   BILITOT 0.4 10/16/2011   ALKPHOS 57 06/07/2017   ALKPHOS 63 10/16/2011   AST 26 06/07/2017   AST 22 10/16/2011   ALT 26 06/07/2017   ALT 23 10/16/2011  .   Total Time in preparing paper work, data evaluation and todays exam - 35 minutes  Tamarra Geiselman M.D on 06/08/2017 at 12:37 PM    Note: This dictation was prepared with Dragon dictation along with smaller phrase technology. Any transcriptional errors that result from this process are unintentional.

## 2017-06-08 NOTE — Progress Notes (Signed)
Subjective: Patient improved today per family but per therapy will need a walker for ambulation.    Objective: Current vital signs: BP (!) 156/64   Pulse (!) 57   Temp 98 F (36.7 C) (Oral)   Resp 16   Ht  (1.727 m)   Wt 74.8 kg (165 lb)   SpO2 98%   BMI 25.09 kg/m  Vital signs in last 24 hours: Temp:  [97.4 F (36.3 C)-98 F (36.7 C)] 98 F (36.7 C) (10/05 1414) Pulse Rate:  [56-85] 57 (10/05 1414) Resp:  [14-16] 16 (10/05 0813) BP: (120-178)/(55-71) 156/64 (10/05 1414) SpO2:  [94 %-100 %] 98 % (10/05 1414) Weight:  [74.8 kg (165 lb)] 74.8 kg (165 lb) (10/04 1427)  Intake/Output from previous day: 10/04 0701 - 10/05 0700 In: 2000  Out: 2800 [Urine:2800] Intake/Output this shift: Total I/O In: 240 [P.O.:240] Out: 200 [Urine:200] Nutritional status: Diet heart healthy/carb modified Room service appropriate? Yes; Fluid consistency: Thin  Neurologic Exam: Mental Status: Alert, oriented, thought content appropriate.  Speech fluent without evidence of aphasia.  Able to follow 3 step commands without difficulty. Cranial Nerves: II: Disc flat; Visual fields grossly normal in the left eye, left pupil reactive  III,IV, VI: mild right ptosist, extra-ocular motions intact  V,VII: smile symmetric, facial light touch sensation normal bilaterally VIII: hearing normal bilaterally IX,X: gag reflex present XI: bilateral shoulder shrug XII: midline tongue extension Motor: Right :  Upper extremity   5/5                                      Left:     Upper extremity   5/5             Lower extremity   5/5                                                  Lower extremity   5/5 Tone and bulk:normal tone throughout; no atrophy noted Gait: wide based and slightly stooped  Lab Results: Basic Metabolic Panel:  Recent Labs Lab 06/07/17 1444 06/08/17 0420  NA 136 140  K 4.1 3.7  CL 103 107  CO2 25 26  GLUCOSE 187* 131*  BUN 18 14  CREATININE 0.80 0.74  CALCIUM 10.0 9.6     Liver Function Tests:  Recent Labs Lab 06/07/17 1444  AST 26  ALT 26  ALKPHOS 57  BILITOT 0.9  PROT 7.2  ALBUMIN 3.9   No results for input(s): LIPASE, AMYLASE in the last 168 hours. No results for input(s): AMMONIA in the last 168 hours.  CBC:  Recent Labs Lab 06/07/17 1444 06/08/17 0420  WBC 11.5* 6.3  NEUTROABS 8.2*  --   HGB 14.8 15.1  HCT 41.9 43.1  MCV 86.9 85.9  PLT 187 171    Cardiac Enzymes:  Recent Labs Lab 06/07/17 1444  TROPONINI <0.03    Lipid Panel:  Recent Labs Lab 06/07/17 1449  CHOL 88  TRIG 66  HDL 36*  CHOLHDL 2.4  VLDL 13  LDLCALC 39    CBG:  Recent Labs Lab 06/07/17 1501 06/07/17 2117  GLUCAP 165* 366*    Microbiology: No results found for this or any previous visit.  Coagulation Studies:  Recent Labs  06/07/17  1444  LABPROT 12.9  INR 0.98    Imaging: Dg Chest 2 View  Result Date: 06/07/2017 CLINICAL DATA:  83 year old male with a history of right-sided facial droop EXAM: CHEST  2 VIEW COMPARISON:  None. FINDINGS: Cardiomediastinal silhouette unchanged in size and contour. Coarsened interstitial markings. Lateral view demonstrates ill-defined airspace opacity at the posterior lung base. No pneumothorax.  No pleural effusion. IMPRESSION: Airspace opacity at the posterior lung base on the lateral view may reflect developing infection. Correlation with lab results and presentation may be useful. Electronically Signed   By: Gilmer Mor D.O.   On: 06/07/2017 16:36   Mr Brain Wo Contrast  Result Date: 06/08/2017 CLINICAL DATA:  Ataxia for 2 days, fell today. Recent trip determine knee, insufficient sleep. Assess for stroke. History of hyperlipidemia, diabetes. EXAM: MRI HEAD WITHOUT CONTRAST TECHNIQUE: Multiplanar, multiecho pulse sequences of the brain and surrounding structures were obtained without intravenous contrast. COMPARISON:  CT HEAD June 07, 2017 and MRI of the head March 30, 2008 FINDINGS: BRAIN: No  reduced diffusion to suggest acute ischemia. Scattered predominantly central chronic micro hemorrhages. Confluent supratentorial and patchy pontine white matter FLAIR T2 hyperintensities. Patchy T2 hyperintensities in the basal ganglia and thalamus seen with chronic small vessel ischemic disease. Old small LEFT cerebellar infarct. Moderate to severe ventriculomegaly on the basis of global parenchymal brain volume loss. No midline shift, mass effect or masses. No abnormal extra-axial fluid collections. VASCULAR: Normal major intracranial vascular flow voids present at skull base. SKULL AND UPPER CERVICAL SPINE: No abnormal sellar expansion. No suspicious calvarial bone marrow signal. Craniocervical junction maintained. SINUSES/ORBITS: Trace paranasal sinus mucosal thickening. Mastoid air cells are well aerated. RIGHT globe prosthesis. OTHER: None. IMPRESSION: 1. No acute intracranial process. 2. Moderate to severe chronic small vessel ischemic disease. Old small LEFT cerebellar infarct. 3. Moderate to severe parenchymal brain volume loss. Electronically Signed   By: Awilda Metro M.D.   On: 06/08/2017 04:28   US Carotid Bilateral  Result Date: 06/08/2017 CLINICAL DATA:  83 year old male with a history of TIA. Cardiovascular risk factors include diabetes EXAM: BILATERAL CAROTID DUPLEX ULTRASOUND TECHNIQUE: Wallace Cullens scale imaging, color Doppler and duplex ultrasound were performed of bilateral carotid and vertebral arteries in the neck. COMPARISON:  08/31/2015 FINDINGS: Criteria: Quantification of carotid stenosis is based on velocity parameters that correlate the residual internal carotid diameter with NASCET-based stenosis levels, using the diameter of the distal internal carotid lumen as the denominator for stenosis measurement. The following velocity measurements were obtained: RIGHT ICA:  Systolic 80 cm/sec, Diastolic 19 cm/sec CCA:  87 cm/sec SYSTOLIC ICA/CCA RATIO:  0.9 ECA:  81 cm/sec LEFT ICA:  Systolic 87  cm/sec, Diastolic 23 cm/sec CCA:  87 cm/sec SYSTOLIC ICA/CCA RATIO:  1.0 ECA:  90 cm/sec Right Brachial SBP: Not acquired Left Brachial SBP: Not acquired RIGHT CAROTID ARTERY: No significant calcifications of the right common carotid artery. Intermediate waveform maintained. Heterogeneous and partially calcified plaque at the right carotid bifurcation. No significant lumen shadowing. Low resistance waveform of the right ICA. No significant tortuosity. RIGHT VERTEBRAL ARTERY: Antegrade flow with low resistance waveform. LEFT CAROTID ARTERY: No significant calcifications of the left common carotid artery. Intermediate waveform maintained. Heterogeneous and partially calcified plaque at the left carotid bifurcation without significant lumen shadowing. Low resistance waveform of the left ICA. No significant tortuosity. LEFT VERTEBRAL ARTERY:  Antegrade flow with low resistance waveform. IMPRESSION: Color duplex indicates minimal heterogeneous and calcified plaque, with no hemodynamically significant stenosis by duplex criteria in the  extracranial cerebrovascular circulation. Signed, Yvone Neu. Loreta Ave, DO [facility] Electronically Signed   By: Gilmer Mor D.O.   On: 06/08/2017 07:09   Ct Head Code Stroke Wo Contrast  Result Date: 06/07/2017 CLINICAL DATA:  Code stroke. Altered level of consciousness, unexplained. Right-sided facial droop. EXAM: CT HEAD WITHOUT CONTRAST TECHNIQUE: Contiguous axial images were obtained from the base of the skull through the vertex without intravenous contrast. COMPARISON:  Brain MRI 03/30/2008 FINDINGS: Brain: No evidence of acute infarction, hemorrhage, hydrocephalus, extra-axial collection or mass lesion/mass effect. Confluent low-density in the cerebral white matter consistent with chronic small vessel ischemia. Cerebral atrophy, mild to moderate for age. Vascular: Atherosclerotic calcification.  No hyperdense vessel.  Skull: No acute or aggressive finding Sinuses/Orbits: Right enucleation with prosthesis.  No acute finding Other: These results were called by telephone at the time of interpretation on 06/07/2017 at 2:55 pm to Dr. Lamont Snowball , who verbally acknowledged these results. ASPECTS [facility] Stroke Program Early CT Score) -left hemisphere - Ganglionic level infarction (caudate, lentiform nuclei, internal capsule, insula, M1-M3 cortex): 7 - Supraganglionic infarction (M4-M6 cortex): 3 Total score (0-10 with 10 being normal): 10 IMPRESSION: 1. No acute finding. ASPECTS is 10. 2. Extensive chronic small vessel ischemia in the cerebral white matter. Electronically Signed   By: Marnee Spring M.D.   On: 06/07/2017 14:59    Medications:  I have reviewed the patient's current medications. Scheduled: . aspirin EC  81 mg Oral Daily  . enoxaparin (LOVENOX) injection  40 mg Subcutaneous Q24H  . galantamine  8 mg Oral BID  . glimepiride  4 mg Oral BID  . insulin detemir  20 Units Subcutaneous Q2200  . latanoprost  1 drop Left Eye QHS  . metFORMIN  1,000 mg Oral BID WC  . pantoprazole  40 mg Oral Daily  . simvastatin  40 mg Oral QHS    Assessment/Plan: Patient improved per family.  Cognitive issues are apparent.  Gait not at baseline per family but again better than at presentation.  MRI of the brain reviewed and shows no acute changes.  Patient on ASA.  Carotid dopplers show no evidence of hemodynamically significant stenosis.  Echocardiogram pending.  A1c 7.8, LDL 39. Suspect patient's most recent decline related to trip.  This was explained to family.    Recommendations: 1.  Continue ASA 2.  Continue therapy on an outpatient basis 3.  Continue galantamine   LOS: 0 days   Thana Farr, MD Neurology [phone] 06/08/2017  2:22 PM

## 2018-06-24 NOTE — Progress Notes (Signed)
Cardiology Office Note  Date:  06/25/2018   ID:  [name] Graven., DOB Nov 19, 1933, MRN [id number]  PCP:  Danella Penton, MD   Chief Complaint  Patient presents with  . other    12 month f/u c/o chest pain. Meds reviewed verbally with pt.    HPI:  [name] [name] is a 84 year-old male with  >20-year history of diabetes  hemoglobin A1c around 7.5,  hyperlipidemia,  cardiac catheterization in 2000, moderate nonobstructive coronary disease with a 50% LAD lesion and a 60% lesion in his right coronary artery.  presents for routine followup of his coronary artery disease  In follow-up today he reports having atypical chest pain at nighttime when getting ready for bed 1 episode every 2-3 weeks Otherwise is Very active, exercises Swims, walks Does not have any chest pain on heavy exertion  Wife concerned about memory problems, difficulty with numbers, directions Has not seen neurology  Beginning of October travelled back from [place], Acted "funny on the plane "collapsed" the next day walking in the heat Evaluation in the hospital MRI as detailed below  Echo normal  MRI head 1. No acute intracranial process. 2. Moderate to severe chronic small vessel ischemic disease. Old small LEFT cerebellar infarct. 3. Moderate to severe parenchymal brain volume loss.  Sertraline started by primary care for depression  Lab work reviewed with him showing  total cholesterol 98, LDL 50 Tolerating his statin  EKG personally reviewed by myself on todays visit  showing normal sinus rhythm with rate 49 bpm, no significant ST or T-wave changes  Other past medical history  Myoview  April 2008 showed normal LV function, EF of 57% with no ischemia or infarction.     CP Several years ago and underwent ETT in 8/10. Walked 9:17 on [place] with no CP or ST-T changes.   PMH:   has a past medical history of CAD (coronary artery disease), CAD (coronary artery disease), Diabetes mellitus, GERD  (gastroesophageal reflux disease), Glaucoma, Hyperlipidemia, Iron deficiency anemia, Memory loss, and Osteoporosis.  PSH:    Past Surgical History:  Procedure Laterality Date  . ANTERIOR CRUCIATE LIGAMENT REPAIR    . Cath (other)    . colon polyps removed     x2  . removal r eye      Current Outpatient Medications  Medication Sig Dispense Refill  . aspirin 81 MG tablet Take 81 mg by mouth daily.      [name] EVENING PRIMROSE OIL PO Take 1 capsule by mouth daily.    [name] galantamine (RAZADYNE) 12 MG tablet Take 12 mg by mouth 2 (two) times daily.     [name] glimepiride (AMARYL) 4 MG tablet Take 4 mg by mouth 2 (two) times daily.     . Glucosamine-Chondroitin 500-400 MG CAPS Take by mouth daily. Take 3 tabs     . Insulin Detemir (LEVEMIR FLEXPEN) 100 UNIT/ML Pen Inject 15 Units into the skin 2 (two) times daily.     [name] latanoprost (XALATAN) 0.005 % ophthalmic solution Place 1 drop into the left eye at bedtime.    . Multiple Vitamin (MULTIVITAMIN) tablet Take 1 tablet by mouth daily.    [name] NITROSTAT 0.4 MG SL tablet TAKE ONE TABLET UNDER THE TONGUE EVERY 5 MINUTES FOR CHEST PAIN. IF NO RELIEF PAST 3RD TAB GO TO EMERGENCY ROOM 25 tablet 1  . Omega-3 Fatty Acids (FISH OIL PO) Take 1 capsule by mouth daily.    [name] omeprazole (PRILOSEC) 20 MG capsule Take  20 mg by mouth daily.      . sertraline (ZOLOFT) 50 MG tablet Take 50 mg by mouth daily.    . simvastatin (ZOCOR) 40 MG tablet Take 40 mg by mouth at bedtime.       No current facility-administered medications for this visit.      Allergies:   Hydrocodone-acetaminophen   Social History:  The patient  reports that he has never smoked. He has never used smokeless tobacco. He reports that he drinks alcohol. He reports that he does not use drugs.   Family History:   family history includes Arthritis in his sister; Heart attack in his father; Stroke in his mother.    Review of Systems: Review of Systems  Constitutional: Negative.   Respiratory:  Negative.   Cardiovascular: Negative.   Gastrointestinal: Negative.   Musculoskeletal: Negative.   Neurological: Negative.   Psychiatric/Behavioral: Positive for memory loss.  All other systems reviewed and are negative.    PHYSICAL EXAM: VS:  BP 116/60 (BP Location: Left Arm, Patient Position: Sitting, Cuff Size: Normal)   Pulse (!) 49   Ht 6' (1.829 m)   Wt 166 lb 8 oz (75.5 kg)   BMI 22.58 kg/m  , BMI Body mass index is 22.58 kg/m. Constitutional:  oriented to person, place, and time. No distress.  HENT:  Head: Normocephalic and atraumatic.  Eyes:  no discharge. No scleral icterus.  Neck: Normal range of motion. Neck supple. No JVD present.  Cardiovascular: Normal rate, regular rhythm, normal heart sounds and intact distal pulses. Exam reveals no gallop and no friction rub. No edema No murmur heard. Pulmonary/Chest: Effort normal and breath sounds normal. No stridor. No respiratory distress.  no wheezes.  no rales.  no tenderness.  Abdominal: Soft.  no distension.  no tenderness.  Musculoskeletal: Normal range of motion.  no  tenderness or deformity.  Neurological:  normal muscle tone. Coordination normal. No atrophy Skin: Skin is warm and dry. No rash noted. not diaphoretic.  Psychiatric:  normal mood and affect. behavior is normal. Thought content normal.    Recent Labs: No results found for requested labs within last 8760 hours.    Lipid Panel Lab Results  Component Value Date   CHOL 88 06/07/2017   HDL 36 (L) 06/07/2017   LDLCALC 39 06/07/2017   TRIG 66 06/07/2017      Wt Readings from Last 3 Encounters:  06/25/18 166 lb 8 oz (75.5 kg)  06/07/17 165 lb (74.8 kg)  12/22/16 170 lb 8 oz (77.3 kg)       ASSESSMENT AND PLAN:  Atherosclerosis of native coronary artery of native heart with stable angina pectoris (HCC) - Plan: EKG 12-Lead Having atypical left-sided chest pain but exercising well without any symptoms No further ischemic work-up needed  Type  2 diabetes mellitus with other circulatory complication, without long-term current use of insulin (HCC) - Plan: EKG 12-Lead Weight stable if not trending downward Managed by primary care  PAD (peripheral artery disease) (HCC) - Plan: EKG 12-Lead Cholesterol at goal Denies any claudication symptoms  Mixed hyperlipidemia - Plan: EKG 12-Lead Cholesterol is at goal on the current lipid regimen. No changes to the medications were made.  Stable  Dementia Wife reports some confusion when traveling, difficulty with numbers and directions Recommend they talk with primary care, may need referral to neurology     Total encounter time more than 25 minutes  Greater than 50% was spent in counseling and coordination of care with the  patient  Disposition:   F/U  6 months   No orders of the defined types were placed in this encounter.    Signed, Dossie Arbour, M.D., Ph.D. 06/25/2018  [facility] [place], [place] [phone]

## 2018-06-25 ENCOUNTER — Ambulatory Visit (INDEPENDENT_AMBULATORY_CARE_PROVIDER_SITE_OTHER): Payer: Medicare Other | Admitting: Cardiovascular Disease

## 2018-06-25 ENCOUNTER — Encounter: Payer: Self-pay | Admitting: Cardiovascular Disease

## 2018-06-25 VITALS — BP 116/60 | HR 49 | Ht 72.0 in | Wt 166.5 lb

## 2018-06-25 DIAGNOSIS — I739 Peripheral vascular disease, unspecified: Secondary | ICD-10-CM | POA: Diagnosis not present

## 2018-06-25 DIAGNOSIS — E782 Mixed hyperlipidemia: Secondary | ICD-10-CM

## 2018-06-25 DIAGNOSIS — E1159 Type 2 diabetes mellitus with other circulatory complications: Secondary | ICD-10-CM | POA: Diagnosis not present

## 2018-06-25 DIAGNOSIS — I25118 Atherosclerotic heart disease of native coronary artery with other forms of angina pectoris: Secondary | ICD-10-CM | POA: Diagnosis not present

## 2018-06-25 NOTE — Patient Instructions (Signed)
Please call if you develop chest pain with exertion, during exercise    Medication Instructions:   No medication changes made  Labwork:  No new labs needed  Testing/Procedures:  No further testing at this time   Follow-Up: It was a pleasure seeing you in the office today. Please call us if you have new issues that need to be addressed before your next appt.  [phone]  Your physician wants you to follow-up in: 12 months.  You will receive a reminder letter in the mail two months in advance. If you don't receive a letter, please call our office to schedule the follow-up appointment.  If you need a refill on your cardiac medications before your next appointment, please call your pharmacy.  For educational health videos Log in to : [email or web address] Or : [email or web address], password : [email or web address]

## 2019-05-29 ENCOUNTER — Ambulatory Visit: Payer: Medicare Other | Attending: Neurology | Admitting: Speech Pathology

## 2019-05-29 ENCOUNTER — Encounter: Payer: Self-pay | Admitting: Speech Pathology

## 2019-05-29 ENCOUNTER — Other Ambulatory Visit: Payer: Self-pay

## 2019-05-29 DIAGNOSIS — R41841 Cognitive communication deficit: Secondary | ICD-10-CM | POA: Insufficient documentation

## 2019-05-29 NOTE — Therapy (Signed)
[place] MAIN [facility] SERVICES [address] [place], [place], [address] Phone: [phone]   Fax:  [phone]  Speech Language Pathology Evaluation  Patient Details  Name: Rameen Quinney. MRN: [id number] Date of Birth: Feb 03, 1934 No data recorded  Encounter Date: 05/29/2019  End of Session - 05/29/19 1545    Visit Number  1    Number of Visits  17    Date for SLP Re-Evaluation  07/29/19    SLP Start Time  1    SLP Stop Time   1515    SLP Time Calculation (min)  75 min    Activity Tolerance  Patient tolerated treatment well       Past Medical History:  Diagnosis Date  . CAD (coronary artery disease)    non-obstructive by cath in 2000 - Myoview in 2008 negative. EF 57% - ETT in 8/10. Walked 9:17 on [place] w no CPor ST-T changes  . CAD (coronary artery disease)   . Diabetes mellitus   . GERD (gastroesophageal reflux disease)   . Glaucoma   . Hyperlipidemia   . Iron deficiency anemia   . Memory loss   . Osteoporosis     Past Surgical History:  Procedure Laterality Date  . ANTERIOR CRUCIATE LIGAMENT REPAIR    . Cath (other)    . colon polyps removed     x2  . removal r eye      There were no vitals filed for this visit.  Subjective Assessment - 05/29/19 1407    Subjective  Pt was very pleasant and cooperative with all evaluation tasks. Wife present and supportive.    Patient is accompained by:  Family member   wife   Currently in Pain?  No/denies         SLP Evaluation [facility] - 05/29/19 1407      SLP Visit Information   SLP Received On  05/29/19    Onset Date  Gradual onset, wife unsure of when memory issues began    Medical Diagnosis  Memory Loss      Subjective   Subjective  Pt was very pleasant, completing all tasks to the best of his ability    Patient/Family Stated Goal  Have better memory, be better at multi-tasking,      General Information   HPI  Pt is here to further investigate memory issues that have  come on gradually. Wife reports decreased concentration and difficulty multi-tasking, i/e talking while driving, or attempting to do things amongst distractions.    Behavioral/Cognition  Wife notes pt has difficulty following conversations, learning new information, decreased concentration, short term memory deficits. Pt has a sleep impairment (REM behavior disorder-Periodic limb movement disorder). He is active, walking and swimming 3x/week to keep physically active.Pt HOH with bilateral hearing aids. Pt and wife report they have only just recently been told pt has had a CVA, upon review of MRI results taken approx 1 year ago, which is consistent with an episode that began on return flight from [place] about a year ago, pt became disoriented, was making statements that didn't quite make sense and following a long walk collapsed in his driveway. After neighbors saw him and asked if he was okay, he told them he was just resting and proceded to go inside and tell his wife to call the doctor that something was not right. Upon recent visit to PCP, he was referred to neurology Dr. Manuella Ghazi who referred pt here to manage new  episodes of memory loss.   Mobility Status  WFL      Balance Screen   Has the patient fallen in the past 6 months  No    Has the patient had a decrease in activity level because of a fear of falling?   No    Is the patient reluctant to leave their home because of a fear of falling?   No      Prior Functional Status   Cognitive/Linguistic Baseline  Within functional limits   prior to newly discovered dx CVA,prob occurred ~1 year ago    Lives With  Spouse    Vocation  Retired   Office manager   Overall Cognitive Status  Impaired/Different from baseline    Area of Impairment  Attention;Memory    Attention Comments  Difficulty multi-tasking    Memory  Decreased short-term memory    Memory  Impaired    Memory Impairment  Storage deficit;Retrieval deficit;Decreased  recall of new information;Decreased short term memory    Decreased Short Term Memory  Verbal basic;Verbal complex;Functional basic;Functional complex    Awareness  Appears intact    Problem Solving  Appears intact    Executive Function  Self Monitoring;Self Correcting    Self Monitoring  Impaired    Self Correcting  Impaired      Auditory Comprehension   Overall Auditory Comprehension  Appears within functional limits for tasks assessed    Yes/No Questions  Within Functional Limits    Commands  Within Functional Limits    Conversation  Moderately complex    Interfering Components  Hearing;Working Best boy  Increased volume;Pausing;Repetition;Slowed speech;Stressing words;Visual/Gestural cues      Visual Recognition/Discrimination   Discrimination  Within Function Limits      Reading Comprehension   Reading Status  Within funtional limits      Expression   Primary Mode of Expression  Verbal      Verbal Expression   Overall Verbal Expression  Appears within functional limits for tasks assessed      Oral Motor/Sensory Function   Overall Oral Motor/Sensory Function  Appears within functional limits for tasks assessed      Motor Speech   Overall Motor Speech  Appears within functional limits for tasks assessed      Standardized Assessments   Standardized Assessments   Montreal Cognitive Assessment (MOCA)    Montreal Cognitive Assessment (MOCA)   14/30=Mod cognitive Impairment      MOCA, version 8.1  Visuospatial/Executive Functioning:  1/5  Alternating Trail Making: 0/1  Visuoconstruction Skills: 0/1  Draw a clock: 1/3 Naming:     3/3 Attention:     1/6  Forward digit span, 4 digits: 1/1  Reverse digit span, 3 digits: 0/1  Vigilance: 0/1  Serial 7's: 0/3 Language:     1/3  Verbal Fluency: 0/1  Repetition: 1/2 Abstraction:     2/2 Delayed Recall:    0/5  Memory Index Score: 4/15 Orientation:     6/6  TOTAL:     14/30= Mod cognitive  impairment   Medical SLP's Cognitive Communicative Assessment  Auditory Comprehension:  Pointing to Single Items, Body parts: 10/10  Y/N Questions: 10/10  2 step Commands: 3/3  Multi-step commands: 3/3 Verbal Expression:  Automatics: 1/1  Divergent Naming: Concrete: 15 items in 60 seconds  Responsive Naming: 4/4  Convergent Naming: 5/5  Object description: 2/3  Sentence Formulation: fluent, composed of grammatically correct and appropriate sentences  Reading  Paragraph comprehension: 2/4  Functional Reading (menu): 2/4 Recent Memory: 3/3 Long term memory: 3/3 Calculating Coins: 2/3 Problem Solving/Safety: 3/3 Sequencing: 3/3       SLP Education - 05/29/19 1545    Education Details  re: role of SLP in cognitive linguistic treatment, compensatory strategies for short term recall    Person(s) Educated  Patient;Spouse    Methods  Explanation;Demonstration;Verbal cues    Comprehension  Verbalized understanding;Need further instruction;Returned demonstration;Verbal cues required         SLP Long Term Goals - 05/29/19 1547      SLP LONG TERM GOAL #1   Title  Pt will use external memory aids and compensatory strategies to recall details re: routine and recent events given min cues.    Time  8    Period  Weeks    Status  New    Target Date  07/29/19      SLP LONG TERM GOAL #2   Title  Pt will complete auditory attention/vigilance  memory tasks with 80% accuracy given min cues.    Time  8    Period  Weeks    Status  New    Target Date  07/29/19      SLP LONG TERM GOAL #3   Title  After listening to 1-3 sentence paragraph, the patient will answer simple yes/no and "wh" questions with 80% accuracy given min cues.    Time  8    Period  Weeks    Status  New    Target Date  07/29/19       Plan - 05/29/19 1546    Clinical Impression Statement  This very pleasant 85 y/o male presents with mod cognitive communication impairments c/b short term recall deficits, reduced  visuospatial and attention skills. Strengths include verbal expression and auditory comprehension skills. Pt will benefit from skilled SLP tx for restorative and compensatory treatment for memory loss, attention and cognitive flexibility.   Speech Therapy Frequency  2x / week    Duration  --   8 weeks   Treatment/Interventions  SLP instruction and feedback;Functional tasks;Compensatory strategies;Patient/family education;Internal/external aids;Cognitive reorganization;Cueing hierarchy    Potential to Achieve Goals  Good    Potential Considerations  Ability to learn/carryover information;Family/community support;Previous level of function;Cooperation/participation level    SLP Home Exercise Plan  TBD    Consulted and Agree with Plan of Care  Patient;Family member/caregiver    Family Member Consulted  wife       Patient will benefit from skilled therapeutic intervention in order to improve the following deficits and impairments:   Cognitive communication deficit    Problem List Patient Active Problem List   Diagnosis Date Noted  . Weakness 06/07/2017  . PAD (peripheral artery disease) (HCC) 09/07/2014  . Diabetes mellitus (HCC) 09/21/2011  . Hyperlipidemia 03/25/2009  . CAD, NATIVE VESSEL 03/25/2009  . CHEST PAIN-PRECORDIAL 03/25/2009    Jeannette HowNovak,Deyton Ellenbecker , MA, CCC-SLP 05/29/2019, 3:54 PM  [place] [facility] [address]  [place], [place], [address] Phone: [phone]   Fax:  [phone]  Name: Francine GravenHenry J Boling Jr. MRN: [id number] Date of Birth: 03/14/1934

## 2019-06-02 ENCOUNTER — Other Ambulatory Visit: Payer: Self-pay

## 2019-06-02 ENCOUNTER — Ambulatory Visit: Payer: Medicare Other | Admitting: Speech Pathology

## 2019-06-02 DIAGNOSIS — R41841 Cognitive communication deficit: Secondary | ICD-10-CM

## 2019-06-02 NOTE — Therapy (Signed)
[place] [facility] [address] [place], [place], [address] Phone: [phone]   Fax:  [phone]  Speech Language Pathology Treatment  Patient Details  Name: [name] GravenHenry J Raschke Jr. MRN: [id number] Date of Birth: 11/27/1933 No data recorded  Encounter Date: 06/02/2019  End of Session - 06/02/19 1614    Visit Number  2    Number of Visits  17    Date for SLP Re-Evaluation  07/29/19    Authorization Time Period  1/10 till Medicare progress note    SLP Start Time  1000    SLP Stop Time   1056    SLP Time Calculation (min)  56 min    Activity Tolerance  Patient tolerated treatment well       Past Medical History:  Diagnosis Date  . CAD (coronary artery disease)    non-obstructive by cath in 2000 - Myoview in 2008 negative. EF 57% - ETT in 8/10. Walked 9:17 on [place] w no CPor ST-T changes  . CAD (coronary artery disease)   . Diabetes mellitus   . GERD (gastroesophageal reflux disease)   . Glaucoma   . Hyperlipidemia   . Iron deficiency anemia   . Memory loss   . Osteoporosis     Past Surgical History:  Procedure Laterality Date  . ANTERIOR CRUCIATE LIGAMENT REPAIR    . Cath (other)    . colon polyps removed     x2  . removal r eye      There were no vitals filed for this visit.  Subjective Assessment - 06/02/19 1611    Subjective  Pt was very pleasant and cooperative with all treatment tasks. Wife present and supportive.            ADULT SLP TREATMENT - 06/02/19 0001      General Information   Behavior/Cognition  Alert;Cooperative;Pleasant mood    Patient Positioning  Upright in chair    Oral care provided  N/A    HPI  Pt is here to further investigate memory issues that have come on gradually. Wife reports decreased concentration and difficulty multi-tasking, i/e talking while driving, or attempting to do things amongst distractions. Wife notes pt has difficulty following conversations, learning new  information, decreased concentration, short term memory deficits. Pt has a sleep impairment (REM behavior disorder-Periodic limb movement disorder). He is active, walking and swimming 3x/week to keep physically active.Pt HOH with bilateral hearing aids. Pt and wife report they have only just recently been told pt has had a CVA, upon review of MRI results taken approx 1 year ago, which is consistent with an episode that began on [place] flight from [place] a [place] ago, pt became disoriented, was making statements that didn't quite make sense and following a long walk collapsed in his driveway. After neighbors saw him and asked if he was okay, he told them he was just resting and proceded to go inside and tell his wife to call the doctor that something was not right. Upon recent visit to PCP, he was referred to neurology Dr. Sherryll BurgerShah who referred pt here to manage new episodes of memory loss.      Treatment Provided   Treatment provided  Cognitive-Linquistic      Pain Assessment   Pain Assessment  No/denies pain      Cognitive-Linquistic Treatment   Treatment focused on  Cognition    Skilled Treatment   Patient performed the following working memory exercises with 100%  accuracy given min verbal cues, ie. Repeating 3 target words in order and reverse order. Patient "pinned" target items to fingers with verbal rehearsal x2 to recall 5 target items with 80% accuracy given min verbal cues. Patient used 1st letter mnemonics to remember a list of 5 items with 90% accuracy given min to mod verbal cues. Pt performed selective attention tasks given mod verbal cues with 100% accuracy and divided attention tasks with 90% accuracy limiting field to 2 sets of 5 alternating stimuli and min to mod verbal cues. Pt overwhelmed alternating with >'er than 2 sets of 5 alternating stimuli, unable to perform w/max cues. Educated pt & wife re: compensatory strategies for short term recall. Pt and wife stated understanding,  but will require additional education to be able to identify/use strategies independently.     Assessment / Recommendations / Plan   Plan  Continue with current plan of care      Progression Toward Goals   Progression toward goals  Progressing toward goals       SLP Education - 06/02/19 1613    Education Details  re: compensatory strategies for short term recall    Person(s) Educated  Spouse;Patient    Methods  Demonstration;Explanation;Handout;Verbal cues    Comprehension  Verbalized understanding;Need further instruction;Returned demonstration;Verbal cues required         SLP Long Term Goals - 05/29/19 1547      SLP LONG TERM GOAL #1   Title  Pt will use external memory aids and compensatory strategies to recall details re: routine and recent events given min cues.    Time  8    Period  Weeks    Status  New    Target Date  07/29/19      SLP LONG TERM GOAL #2   Title  Pt will complete auditory attention/vigilance  memory tasks with 80% accuracy given min cues.    Time  8    Period  Weeks    Status  New    Target Date  07/29/19      SLP LONG TERM GOAL #3   Title  After listening to 1-3 sentence paragraph, the patient will answer simple yes/no and "wh" questions with 80% accuracy given min cues.    Time  8    Period  Weeks    Status  New    Target Date  07/29/19       Plan - 06/02/19 1615    Clinical Impression Statement Pt demonstrated accurate understanding and use of compensatory strategies to aid short term recall. Noted marked improved accuracy and attention decreasing field down to 2 sets of 5 alternating stimuli with alternating attention tasks. Pt will continue to benefit from skilled ST services to improve functional memory & communication skills.   Speech Therapy Frequency  2x / week    Duration  Other (comment)   8 weeks   Treatment/Interventions  SLP instruction and feedback;Functional tasks;Compensatory strategies;Patient/family  education;Internal/external aids;Cognitive reorganization;Cueing hierarchy    Potential to Achieve Goals  Good    Potential Considerations  Ability to learn/carryover information;Family/community support;Previous level of function;Cooperation/participation level    SLP Home Exercise Plan  Alternating attention tasks    Consulted and Agree with Plan of Care  Patient;Family member/caregiver    Family Member Consulted  wife       Patient will benefit from skilled therapeutic intervention in order to improve the following deficits and impairments:   Cognitive communication deficit    Problem List  Patient Active Problem List   Diagnosis Date Noted  . Weakness 06/07/2017  . PAD (peripheral artery disease) (HCC) 09/07/2014  . Diabetes mellitus (HCC) 09/21/2011  . Hyperlipidemia 03/25/2009  . CAD, NATIVE VESSEL 03/25/2009  . CHEST PAIN-PRECORDIAL 03/25/2009    Jeannette How, MA, CCC-SLP 06/02/2019, 4:30 PM  [place] [facility] MAIN [facility] SERVICES [address] [place], [place], [address] Phone: [phone]   Fax:  [phone]   Name: [name] [name]. MRN: [id number] Date of Birth: 1933/12/18

## 2019-06-05 ENCOUNTER — Ambulatory Visit: Payer: Medicare Other | Attending: Neurology | Admitting: Speech Pathology

## 2019-06-05 ENCOUNTER — Other Ambulatory Visit: Payer: Self-pay

## 2019-06-05 ENCOUNTER — Encounter: Payer: Self-pay | Admitting: Speech Pathology

## 2019-06-05 DIAGNOSIS — R41841 Cognitive communication deficit: Secondary | ICD-10-CM | POA: Diagnosis not present

## 2019-06-05 NOTE — Therapy (Signed)
[place] MAIN [facility] SERVICES [address] [place], [place], [address] Phone: [phone]   Fax:  [phone]  Speech Language Pathology Treatment  Patient Details  Name: [name] [name]. MRN: [id number] Date of Birth: 09/06/1933 No data recorded  Encounter Date: 06/05/2019  End of Session - 06/05/19 1745    Visit Number  3    Number of Visits  17    Date for SLP Re-Evaluation  07/29/19    Authorization Time Period  2/10 till Medicare progress note    SLP Start Time  0900    SLP Stop Time   0950    SLP Time Calculation (min)  50 min    Activity Tolerance  Patient tolerated treatment well       Past Medical History:  Diagnosis Date  . CAD (coronary artery disease)    non-obstructive by cath in 2000 - Myoview in 2008 negative. EF 57% - ETT in 8/10. Walked 9:17 on [place] w no CPor ST-T changes  . CAD (coronary artery disease)   . Diabetes mellitus   . GERD (gastroesophageal reflux disease)   . Glaucoma   . Hyperlipidemia   . Iron deficiency anemia   . Memory loss   . Osteoporosis     Past Surgical History:  Procedure Laterality Date  . ANTERIOR CRUCIATE LIGAMENT REPAIR    . Cath (other)    . colon polyps removed     x2  . removal r eye      There were no vitals filed for this visit.  Subjective Assessment - 06/05/19 1744    Subjective  Pt was very pleasant and cooperative with all treatment tasks. Wife present and supportive.    Patient is accompained by:  Family member    Currently in Pain?  No/denies            ADULT SLP TREATMENT - 06/05/19 0001      General Information   Behavior/Cognition  Alert;Cooperative;Pleasant mood    Patient Positioning  Upright in chair    Oral care provided  N/A    HPI  Pt is here to further investigate memory issues that have come on gradually. Wife reports decreased concentration and difficulty multi-tasking, i/e talking while driving, or attempting to do things amongst  distractions. Wife notes pt has difficulty following conversations, learning new information, decreased concentration, short term memory deficits. Pt has a sleep impairment (REM behavior disorder-Periodic limb movement disorder). He is active, walking and swimming 3x/week to keep physically active.Pt HOH with bilateral hearing aids. Pt and wife report they have only just recently been told pt has had a CVA, upon review of MRI results taken approx 1 year ago, which is consistent with an episode that began on return flight from [place] about a year ago, pt became disoriented, was making statements that didn't quite make sense and following a long walk collapsed in his driveway. After neighbors saw him and asked if he was okay, he told them he was just resting and proceded to go inside and tell his wife to call the doctor that something was not right. Upon recent visit to PCP, he was referred to neurology Dr. Manuella Ghazi who referred pt here to manage new episodes of memory loss.      Treatment Provided   Treatment provided  Cognitive-Linquistic      Cognitive-Linquistic Treatment   Treatment focused on  Cognition    Skilled Treatment  Pt performed selective attention tasks given  min verbal cues with 75% accuracy and divided attention tasks with 75% accuracy limiting field to 2 sets of 3 alternating stimuli and min to mod verbal and visual cues.  Educated pt & wife re: compensatory strategies for divided attention in the home. Pt and wife stated understanding, but will require additional education to be able to identify/use strategies independently. Pt and wife to identify common divided attention tasks that cause difficulty at home to address in next session.     Assessment / Recommendations / Plan   Plan  Continue with current plan of care      Progression Toward Goals   Progression toward goals  Progressing toward goals       SLP Education - 06/05/19 1745    Education Details  re: compensatory strategies  to aid short term recall and divided attention    Person(s) Educated  Patient;Spouse    Methods  Explanation;Demonstration;Verbal cues;Handout    Comprehension  Verbalized understanding;Need further instruction;Returned demonstration;Verbal cues required         SLP Long Term Goals - 05/29/19 1547      SLP LONG TERM GOAL #1   Title  Pt will use external memory aids and compensatory strategies to recall details re: routine and recent events given min cues.    Time  8    Period  Weeks    Status  New    Target Date  07/29/19      SLP LONG TERM GOAL #2   Title  Pt will complete auditory attention/vigilance  memory tasks with 80% accuracy given min cues.    Time  8    Period  Weeks    Status  New    Target Date  07/29/19      SLP LONG TERM GOAL #3   Title  After listening to 1-3 sentence paragraph, the patient will answer simple yes/no and "wh" questions with 80% accuracy given min cues.    Time  8    Period  Weeks    Status  New    Target Date  07/29/19       Plan - 06/05/19 1746    Clinical Impression Statement  Visual cues markedly improved accuracy when performing divided attention tasks. Pt began to improve timeliness of task completion as trials progressed. Pt will continue to benefit from skilled ST services to improve functional memory & communication skills.   Speech Therapy Frequency  2x / week    Duration  Other (comment)   8 weeks   Treatment/Interventions  SLP instruction and feedback;Functional tasks;Compensatory strategies;Patient/family education;Internal/external aids;Cognitive reorganization;Cueing hierarchy    Potential to Achieve Goals  Good    Potential Considerations  Ability to learn/carryover information;Family/community support;Previous level of function;Cooperation/participation level    SLP Home Exercise Plan  Alternating, Divided  attention tasks    Consulted and Agree with Plan of Care  Patient;Family member/caregiver    Family Member Consulted   wife       Patient will benefit from skilled therapeutic intervention in order to improve the following deficits and impairments:   Cognitive communication deficit    Problem List Patient Active Problem List   Diagnosis Date Noted  . Weakness 06/07/2017  . PAD (peripheral artery disease) (HCC) 09/07/2014  . Diabetes mellitus (HCC) 09/21/2011  . Hyperlipidemia 03/25/2009  . CAD, NATIVE VESSEL 03/25/2009  . CHEST PAIN-PRECORDIAL 03/25/2009    [name] Ashland, MA, CCC-SLP 06/05/2019, 5:52 PM  [place] [facility] [address]  [place], [place], [address] Phone: [phone]   Fax:  [phone]   Name: [name] [name]. MRN: [id number] Date of Birth: 1934/01/24

## 2019-06-09 ENCOUNTER — Encounter: Payer: Self-pay | Admitting: Speech Pathology

## 2019-06-09 ENCOUNTER — Other Ambulatory Visit: Payer: Self-pay

## 2019-06-09 ENCOUNTER — Ambulatory Visit: Payer: Medicare Other | Admitting: Speech Pathology

## 2019-06-09 DIAGNOSIS — R41841 Cognitive communication deficit: Secondary | ICD-10-CM

## 2019-06-09 NOTE — Therapy (Signed)
[facility] [address] [place], [place], [address] Phone: [phone]   Fax:  [phone]  Speech Language Pathology Treatment  Patient Details  Name: Francine GravenHenry J Rappa Jr. MRN: [id number] Date of Birth: 12/12/1933 No data recorded  Encounter Date: 06/09/2019  End of Session - 06/09/19 1708    Visit Number  4    Number of Visits  17    Date for SLP Re-Evaluation  07/29/19    Authorization Time Period  3/10 till Medicare progress note    Authorization - Visit Number  3    Authorization - Number of Visits  10    SLP Start Time  1001    SLP Stop Time   1100    SLP Time Calculation (min)  59 min    Activity Tolerance  Patient tolerated treatment well       Past Medical History:  Diagnosis Date  . CAD (coronary artery disease)    non-obstructive by cath in 2000 - Myoview in 2008 negative. EF 57% - ETT in 8/10. Walked 9:17 on [place] w no CPor ST-T changes  . CAD (coronary artery disease)   . Diabetes mellitus   . GERD (gastroesophageal reflux disease)   . Glaucoma   . Hyperlipidemia   . Iron deficiency anemia   . Memory loss   . Osteoporosis     Past Surgical History:  Procedure Laterality Date  . ANTERIOR CRUCIATE LIGAMENT REPAIR    . Cath (other)    . colon polyps removed     x2  . removal r eye      There were no vitals filed for this visit.  Subjective Assessment - 06/09/19 1009    Subjective  Pt appeared to attempt all tasks to the best of his ability, appeared to fatigue after first 15 min of tx.    Patient is accompained by:  Family member    Currently in Pain?  No/denies            ADULT SLP TREATMENT - 06/09/19 0001      General Information   Behavior/Cognition  Alert;Cooperative;Pleasant mood    Patient Positioning  Upright in chair    Oral care provided  N/A    HPI  Pt is here to further investigate memory issues that have come on gradually. Wife reports decreased concentration and  difficulty multi-tasking, i/e talking while driving, or attempting to do things amongst distractions. Wife notes pt has difficulty following conversations, learning new information, decreased concentration, short term memory deficits. Pt has a sleep impairment (REM behavior disorder-Periodic limb movement disorder). He is active, walking and swimming 3x/week to keep physically active.Pt HOH with bilateral hearing aids. Pt and wife report they have only just recently been told pt has had a CVA, upon review of MRI results taken approx 1 year ago, which is consistent with an episode that [place] on [place] from [place] a year ago, pt became disoriented, was making statements that didn't quite make sense and following a long walk collapsed in his driveway. After neighbors saw him and asked if he was okay, he told them he was just resting and proceded to go inside and tell his wife to call the doctor that something was not right. Upon recent visit to PCP, he was referred to neurology Dr. Sherryll BurgerShah who referred pt here to manage new episodes of memory loss.      Treatment Provided   Treatment provided  Cognitive-Linquistic  Pain Assessment   Pain Assessment  No/denies pain      Cognitive-Linquistic Treatment   Treatment focused on  Cognition    Skilled Treatment  Pt performed divided attention trail making tasks with 90% accuracy limiting field to 2 sets of 5 alternating stimuli and min verbal cues. Pt performed additional divided attention task, identifying if playing card matched given rule, ie-only spades, only hearts with 70% accuracy given semi regular verbal cues.  Educated pt & wife re: compensatory strategies/modifications for divided attention in the home to address communication breakdowns and problems at home. Pt and wife stated understanding, but will require reinforcement to be able to identify/use strategies independently. Pt and wife to continue to identify common divided attention  tasks that cause difficulty at home to address.     Assessment / Recommendations / Plan   Plan  Continue with current plan of care      Progression Toward Goals   Progression toward goals  Progressing toward goals       SLP Education - 06/09/19 1708    Education Details  re: compensatory strategies to aid short term recall and divided attention    Person(s) Educated  Patient;Spouse    Methods  Explanation;Demonstration;Verbal cues;Handout    Comprehension  Verbalized understanding;Need further instruction;Returned demonstration;Verbal cues required         SLP Long Term Goals - 05/29/19 1547      SLP LONG TERM GOAL #1   Title  Pt will use external memory aids and compensatory strategies to recall details re: routine and recent events given min cues.    Time  8    Period  Weeks    Status  New    Target Date  07/29/19      SLP LONG TERM GOAL #2   Title  Pt will complete auditory attention/vigilance  memory tasks with 80% accuracy given min cues.    Time  8    Period  Weeks    Status  New    Target Date  07/29/19      SLP LONG TERM GOAL #3   Title  After listening to 1-3 sentence paragraph, the patient will answer simple yes/no and "wh" questions with 80% accuracy given min cues.    Time  8    Period  Weeks    Status  New    Target Date  07/29/19       Plan - 06/09/19 1709    Clinical Impression Statement  Noted improved divided attention with alternating trail making, increasing stimuli from 3 alternating sets to 5. Pt and wife report consistent practice of HEP. [name] Pt will continue to benefit from skilled ST services to improve functional memory, attention & communication skills.   Speech Therapy Frequency  2x / week    Duration  Other (comment)   8 weeks   Treatment/Interventions  SLP instruction and feedback;Functional tasks;Compensatory strategies;Patient/family education;Internal/external aids;Cognitive reorganization;Cueing hierarchy    Potential to Achieve Goals   Good    Potential Considerations  Ability to learn/carryover information;Family/community support;Previous level of function;Cooperation/participation level    SLP Home Exercise Plan  Alternating, Divided  attention tasks    Consulted and Agree with Plan of Care  Patient;Family member/caregiver    Family Member Consulted  wife       Patient will benefit from skilled therapeutic intervention in order to improve the following deficits and impairments:   Cognitive communication deficit    Problem List Patient Active Problem List  Diagnosis Date Noted  . Weakness 06/07/2017  . PAD (peripheral artery disease) ([place]) 09/07/2014  . Diabetes mellitus ([place]) 09/21/2011  . Hyperlipidemia 03/25/2009  . CAD, NATIVE VESSEL 03/25/2009  . CHEST PAIN-PRECORDIAL 03/25/2009    Rulon Eisenmenger, MA, CCC-SLP 06/09/2019, 5:19 PM  [place] MAIN [facility] SERVICES [address] [place], [place], [address] Phone: [phone]   Fax:  [phone]   Name: Dhanvin Szeto. MRN: [id number] Date of Birth: 11/09/1933

## 2019-06-12 ENCOUNTER — Other Ambulatory Visit: Payer: Self-pay

## 2019-06-12 ENCOUNTER — Encounter: Payer: Self-pay | Admitting: Speech Pathology

## 2019-06-12 ENCOUNTER — Ambulatory Visit: Payer: Medicare Other | Admitting: Speech Pathology

## 2019-06-12 DIAGNOSIS — R41841 Cognitive communication deficit: Secondary | ICD-10-CM | POA: Diagnosis not present

## 2019-06-12 NOTE — Therapy (Signed)
[facility] [address] [place], [place], [address] Phone: [phone]   Fax:  [phone]  Speech Language Pathology Treatment  Patient Details  Name: [name] GravenHenry J Graybill Jr. MRN: [id number] Date of Birth: 09/20/1933 No data recorded  Encounter Date: 06/12/2019  End of Session - 06/12/19 1738    Visit Number  5    Number of Visits  17    Date for SLP Re-Evaluation  07/29/19    Authorization Type  Medicare    Authorization - Visit Number  4    Authorization - Number of Visits  10    SLP Start Time  0900    SLP Stop Time   0958    SLP Time Calculation (min)  58 min    Activity Tolerance  Patient tolerated treatment well       Past Medical History:  Diagnosis Date  . CAD (coronary artery disease)    non-obstructive by cath in 2000 - Myoview in 2008 negative. EF 57% - ETT in 8/10. Walked 9:17 on [place] w no CPor ST-T changes  . CAD (coronary artery disease)   . Diabetes mellitus   . GERD (gastroesophageal reflux disease)   . Glaucoma   . Hyperlipidemia   . Iron deficiency anemia   . Memory loss   . Osteoporosis     Past Surgical History:  Procedure Laterality Date  . ANTERIOR CRUCIATE LIGAMENT REPAIR    . Cath (other)    . colon polyps removed     x2  . removal r eye      There were no vitals filed for this visit.  Subjective Assessment - 06/12/19 0911    Subjective  Pt appeared to attempt all tasks to the best of his ability, appeared to fatigue quickly today. Pt reported he would do better with afternoon appointments, he stated he was very sleepy today.    Patient is accompained by:  Family member    Currently in Pain?  No/denies            ADULT SLP TREATMENT - 06/12/19 0001      General Information   Behavior/Cognition  Alert;Cooperative;Pleasant mood    Patient Positioning  Upright in chair    Oral care provided  N/A    HPI  Pt is here to further investigate memory issues that have come on  gradually. Wife reports decreased concentration and difficulty multi-tasking, i/e talking while driving, or attempting to do things amongst distractions. Wife notes pt has difficulty following conversations, learning new information, decreased concentration, short term memory deficits. Pt has a sleep impairment (REM behavior disorder-Periodic limb movement disorder). He is active, walking and swimming 3x/week to keep physically active.Pt HOH with bilateral hearing aids. Pt and wife report they have only just recently been told pt has had a CVA, upon review of MRI results taken approx 1 year ago, which is consistent with an episode that began on [place] flight from [place] about a year ago, pt became disoriented, was making statements that didn't quite make sense and following a long walk collapsed in his driveway. After neighbors saw him and asked if he was okay, he told them he was just resting and proceded to go inside and tell his wife to call the doctor that something was not right. Upon recent visit to PCP, he was referred to neurology Dr. Sherryll BurgerShah who referred pt here to manage new episodes of memory loss.      Treatment  Provided   Treatment provided  Cognitive-Linquistic      Pain Assessment   Pain Assessment  No/denies pain      Cognitive-Linquistic Treatment   Treatment focused on  Cognition    Skilled Treatment Pt performed divided attention trail making tasks with 90% accuracy limiting field to 2 sets of 6 alternating stimuli and min verbal cues. Pt performed additional divided attention task, identifying if playing card matched given rule, ie-only spades, only hearts with 90% accuracy given min verbal cues. Educated pt & wife re: compensatory strategies/modifications for divided attention in the home to address communication breakdowns and problems at home. Pt and wife stated understanding, but will require reinforcement to be able to identify/use strategies independently. Pt and wife to continue  to identify common divided attention tasks that cause difficulty at home to address.      Assessment / Recommendations / Plan   Plan  Continue with current plan of care      Progression Toward Goals   Progression toward goals  Progressing toward goals       SLP Education - 06/12/19 1738    Education Details  re: compensatory strategies to aid short term recall and divided attention    Person(s) Educated  Patient;Spouse    Methods  Demonstration;Explanation;Verbal cues    Comprehension  Verbalized understanding;Need further instruction;Returned demonstration;Verbal cues required         SLP Long Term Goals - 05/29/19 1547      SLP LONG TERM GOAL #1   Title  Pt will use external memory aids and compensatory strategies to recall details re: routine and recent events given min cues.    Time  8    Period  Weeks    Status  New    Target Date  07/29/19      SLP LONG TERM GOAL #2   Title  Pt will complete auditory attention/vigilance  memory tasks with 80% accuracy given min cues.    Time  8    Period  Weeks    Status  New    Target Date  07/29/19      SLP LONG TERM GOAL #3   Title  After listening to 1-3 sentence paragraph, the patient will answer simple yes/no and "wh" questions with 80% accuracy given min cues.    Time  8    Period  Weeks    Status  New    Target Date  07/29/19       Plan - 06/12/19 1739    Clinical Impression Statement  Improved independence and accuracy with divided attention this session. Pt will continue to benefit from skilled ST services to improve functional memory & communication skills.   Speech Therapy Frequency  2x / week    Duration  Other (comment)   8 weeks   Treatment/Interventions  SLP instruction and feedback;Functional tasks;Compensatory strategies;Patient/family education;Internal/external aids;Cognitive reorganization;Cueing hierarchy    Potential to Achieve Goals  Good    Potential Considerations  Ability to learn/carryover  information;Family/community support;Previous level of function;Cooperation/participation level    SLP Home Exercise Plan  Alternating, Divided  attention tasks    Consulted and Agree with Plan of Care  Patient;Family member/caregiver    Family Member Consulted  wife       Patient will benefit from skilled therapeutic intervention in order to improve the following deficits and impairments:   Cognitive communication deficit    Problem List Patient Active Problem List   Diagnosis Date Noted  . Weakness  06/07/2017  . PAD (peripheral artery disease) ([place]) 09/07/2014  . Diabetes mellitus ([place]) 09/21/2011  . Hyperlipidemia 03/25/2009  . CAD, NATIVE VESSEL 03/25/2009  . CHEST PAIN-PRECORDIAL 03/25/2009    Rulon Eisenmenger, MA, CCC-SLP 06/12/2019, 5:43 PM  [place] MAIN [facility] SERVICES [address] [place], [place], [address] Phone: [phone]   Fax:  [phone]   Name: Hameed Kolar. MRN: [id number] Date of Birth: [birth year]

## 2019-06-16 ENCOUNTER — Other Ambulatory Visit: Payer: Self-pay

## 2019-06-16 ENCOUNTER — Encounter: Payer: Self-pay | Admitting: Speech Pathology

## 2019-06-16 ENCOUNTER — Ambulatory Visit: Payer: Medicare Other | Admitting: Speech Pathology

## 2019-06-16 DIAGNOSIS — R41841 Cognitive communication deficit: Secondary | ICD-10-CM

## 2019-06-16 NOTE — Therapy (Signed)
[place] [facility] MAIN [facility] SERVICES [address] [place], [place], [address] Phone: [phone]   Fax:  [phone]  Speech Language Pathology Treatment  Patient Details  Name: [name] [name]. MRN: [id number] Date of Birth: July 12, 1934 No data recorded  Encounter Date: 06/16/2019  End of Session - 06/16/19 1720    Visit Number  6    Number of Visits  17    Date for SLP Re-Evaluation  07/29/19    Authorization Type  Medicare    Authorization - Visit Number  5    Authorization - Number of Visits  10    SLP Start Time  0900    SLP Stop Time   0956    SLP Time Calculation (min)  56 min    Activity Tolerance  Patient tolerated treatment well       Past Medical History:  Diagnosis Date  . CAD (coronary artery disease)    non-obstructive by cath in 2000 - Myoview in 2008 negative. EF 57% - ETT in 8/10. Walked 9:17 on [place] w no CPor ST-T changes  . CAD (coronary artery disease)   . Diabetes mellitus   . GERD (gastroesophageal reflux disease)   . Glaucoma   . Hyperlipidemia   . Iron deficiency anemia   . Memory loss   . Osteoporosis     Past Surgical History:  Procedure Laterality Date  . ANTERIOR CRUCIATE LIGAMENT REPAIR    . Cath (other)    . colon polyps removed     x2  . removal r eye      There were no vitals filed for this visit.  Subjective Assessment - 06/16/19 0916    Subjective  Pt was pleasant and cooperative, steadily working on all tx tasks, did not appear to fatigue as in previous sessions.    Patient is accompained by:  Family member   wife   Currently in Pain?  No/denies            ADULT SLP TREATMENT - 06/16/19 0001      General Information   Behavior/Cognition  Alert;Cooperative;Pleasant mood    Patient Positioning  Upright in chair    HPI  Pt is here to further investigate memory issues that have come on gradually. Wife reports decreased concentration and difficulty multi-tasking, i/e talking while  driving, or attempting to do things amongst distractions. Wife notes pt has difficulty following conversations, learning new information, decreased concentration, short term memory deficits. Pt has a sleep impairment (REM behavior disorder-Periodic limb movement disorder). He is active, walking and swimming 3x/week to keep physically active.Pt HOH with bilateral hearing aids. Pt and wife report they have only just recently been told pt has had a CVA, upon review of MRI results taken approx 1 year ago, which is consistent with an episode that began on return flight from [place] about a year ago, pt became disoriented, was making statements that didn't quite make sense and following a long walk collapsed in his driveway. After neighbors saw him and asked if he was okay, he told them he was just resting and proceded to go inside and tell his wife to call the doctor that something was not right. Upon recent visit to PCP, he was referred to neurology Dr. Sherryll Burger who referred pt here to manage new episodes of memory loss.      Treatment Provided   Treatment provided  Cognitive-Linquistic      Pain Assessment   Pain Assessment  No/denies pain      Cognitive-Linquistic Treatment   Treatment focused on  Cognition    Skilled Treatment  Pt performed divided attention trail making tasks with 100% accuracy field of 2 sets of 10 alternating stimuli and intermittent min verbal cues. Pt performed additional divided attention task, sorting and organizing a line of numbers into odd/even in ascending order with 80% accuracy given regular verbal cues. Pt answered wh questions following reading a 3 sentence paragraph from memory (paragraph not available for review) with 75% accuracy given min cues.      Assessment / Recommendations / Plan   Plan  Continue with current plan of care      Progression Toward Goals   Progression toward goals  Progressing toward goals       SLP Education - 06/16/19 1719    Education  Details  re: compensatory strategies to aid short term recall and divided attention    Person(s) Educated  Patient    Methods  Explanation;Demonstration;Verbal cues    Comprehension  Verbalized understanding;Need further instruction;Returned demonstration;Verbal cues required         SLP Long Term Goals - 05/29/19 1547      SLP LONG TERM GOAL #1   Title  Pt will use external memory aids and compensatory strategies to recall details re: routine and recent events given min cues.    Time  8    Period  Weeks    Status  New    Target Date  07/29/19      SLP LONG TERM GOAL #2   Title  Pt will complete auditory attention/vigilance  memory tasks with 80% accuracy given min cues.    Time  8    Period  Weeks    Status  New    Target Date  07/29/19      SLP LONG TERM GOAL #3   Title  After listening to 1-3 sentence paragraph, the patient will answer simple yes/no and "wh" questions with 80% accuracy given min cues.    Time  8    Period  Weeks    Status  New    Target Date  07/29/19       Plan - 06/16/19 1720    Clinical Impression Statement  Continued improved independence and accuracy with divided attention this session, able to increase alternating stimuli from 6 to 10. Pt will continue to benefit from skilled ST services to improve functional memory & communication skills.   Speech Therapy Frequency  2x / week    Duration  Other (comment)   8 weeks   Treatment/Interventions  SLP instruction and feedback;Functional tasks;Compensatory strategies;Patient/family education;Internal/external aids;Cognitive reorganization;Cueing hierarchy    Potential to Achieve Goals  Good    Potential Considerations  Ability to learn/carryover information;Family/community support;Previous level of function;Cooperation/participation level    SLP Home Exercise Plan  Alternating, Divided  attention tasks    Consulted and Agree with Plan of Care  Patient;Family member/caregiver    Family Member Consulted   wife       Patient will benefit from skilled therapeutic intervention in order to improve the following deficits and impairments:   Cognitive communication deficit    Problem List Patient Active Problem List   Diagnosis Date Noted  . Weakness 06/07/2017  . PAD (peripheral artery disease) ([place]) 09/07/2014  . Diabetes mellitus ([place]) 09/21/2011  . Hyperlipidemia 03/25/2009  . CAD, NATIVE VESSEL 03/25/2009  . CHEST PAIN-PRECORDIAL 03/25/2009    Clydene Burack, MA, CCC-SLP 06/16/2019, 5:27 PM  [place] [facility] [address] [place], [place], [address] Phone: [phone]   Fax:  [phone]   Name: Francine GravenHenry J Pincus Jr. MRN: [id number] Date of Birth: 06/19/1934

## 2019-06-19 ENCOUNTER — Ambulatory Visit: Payer: Medicare Other | Admitting: Speech Pathology

## 2019-06-19 ENCOUNTER — Encounter: Payer: Self-pay | Admitting: Speech Pathology

## 2019-06-19 ENCOUNTER — Other Ambulatory Visit: Payer: Self-pay

## 2019-06-19 DIAGNOSIS — R41841 Cognitive communication deficit: Secondary | ICD-10-CM

## 2019-06-19 NOTE — Therapy (Signed)
[place] [facility] [address] [place], [place], [address] Phone: [phone]   Fax:  [phone]  Speech Language Pathology Treatment  Patient Details  Name: [name] GravenHenry J Carlyon Jr. MRN: [id number] Date of Birth: 05/10/1934 No data recorded  Encounter Date: 06/19/2019  End of Session - 06/19/19 1250    Visit Number  7    Number of Visits  17    Date for SLP Re-Evaluation  07/29/19    Authorization Type  Medicare    Authorization - Visit Number  6    Authorization - Number of Visits  10    SLP Start Time  0906    SLP Stop Time   [id number]    SLP Time Calculation (min)  46 min    Activity Tolerance  Patient tolerated treatment well       Past Medical History:  Diagnosis Date  . CAD (coronary artery disease)    non-obstructive by cath in 2000 - Myoview in 2008 negative. EF 57% - ETT in 8/10. Walked 9:17 on [place] w no CPor ST-T changes  . CAD (coronary artery disease)   . Diabetes mellitus   . GERD (gastroesophageal reflux disease)   . Glaucoma   . Hyperlipidemia   . Iron deficiency anemia   . Memory loss   . Osteoporosis     Past Surgical History:  Procedure Laterality Date  . ANTERIOR CRUCIATE LIGAMENT REPAIR    . Cath (other)    . colon polyps removed     x2  . removal r eye      There were no vitals filed for this visit.  Subjective Assessment - 06/19/19 1248    Subjective  Pt was pleasant and cooperative, steadily working on all tx tasks, did not appear to fatigue as in previous sessions.    Patient is accompained by:  Family member   wife   Currently in Pain?  No/denies            ADULT SLP TREATMENT - 06/19/19 0001      General Information   Behavior/Cognition  Alert;Cooperative;Pleasant mood    Patient Positioning  Upright in chair    HPI  Pt is here to further investigate memory issues that have come on gradually. Wife reports decreased concentration and difficulty multi-tasking, i/e talking while  driving, or attempting to do things amongst distractions. Wife notes pt has difficulty following conversations, learning new information, decreased concentration, short term memory deficits. Pt has a sleep impairment (REM behavior disorder-Periodic limb movement disorder). He is active, walking and swimming 3x/week to keep physically active.Pt HOH with bilateral hearing aids. Pt and wife report they have only just recently been told pt has had a CVA, upon review of MRI results taken approx 1 year ago, which is consistent with an episode that began on [place] flight from [place] about a year ago, pt became disoriented, was making statements that didn't quite make sense and following a long walk collapsed in his driveway. After neighbors saw him and asked if he was okay, he told them he was just resting and proceded to go inside and tell his wife to call the doctor that something was not right. Upon recent visit to PCP, he was referred to neurology Dr. Sherryll BurgerShah who referred pt here to manage new episodes of memory loss.      Treatment Provided   Treatment provided  Cognitive-Linquistic      Pain Assessment   Pain Assessment  No/denies pain      Cognitive-Linquistic Treatment   Treatment focused on  Cognition    Skilled Treatment  Pt performed divided attention trail making tasks, field of 2 sets of 4 alternating stimuli with 100% accuracy and intermittent min verbal cues, field of 2 sets of 5 alternating stimuli with 80 accuracy given min cues. Pt performed additional divided attention task, sorting and organizing a line of numbers into odd/even in ascending order with 100% accuracy given semi-regular verbal cues. Pt answered wh questions following listening to a 3 sentence paragraph with 90% accuracy using self generated written notes, and with 75% accuracy using repetition to aid short term recall.       Assessment / Recommendations / Plan   Plan  Continue with current plan of care      Progression  Toward Goals   Progression toward goals  Progressing toward goals       SLP Education - 06/19/19 1249    Education Details  re: compensatory strategies to aid short term recall, divided & selective attention    Person(s) Educated  Patient    Methods  Explanation;Demonstration;Verbal cues    Comprehension  Verbalized understanding;Need further instruction;Verbal cues required;Returned demonstration         SLP Long Term Goals - 05/29/19 1547      SLP LONG TERM GOAL #1   Title  Pt will use external memory aids and compensatory strategies to recall details re: routine and recent events given min cues.    Time  8    Period  Weeks    Status  New    Target Date  07/29/19      SLP LONG TERM GOAL #2   Title  Pt will complete auditory attention/vigilance  memory tasks with 80% accuracy given min cues.    Time  8    Period  Weeks    Status  New    Target Date  07/29/19      SLP LONG TERM GOAL #3   Title  After listening to 1-3 sentence paragraph, the patient will answer simple yes/no and "wh" questions with 80% accuracy given min cues.    Time  8    Period  Weeks    Status  New    Target Date  07/29/19       Plan - 06/19/19 1250    Clinical Impression Statement  Increased difficulty with divided attention tasks this session requiring an increased level of cueing and decrease of  task difficulty to perform with consistency. Pt will continue to benefit from skilled ST services to improve functional memory & communication skills.   Speech Therapy Frequency  2x / week    Duration  Other (comment)   8 weeks   Treatment/Interventions  SLP instruction and feedback;Functional tasks;Compensatory strategies;Patient/family education;Internal/external aids;Cognitive reorganization;Cueing hierarchy    Potential to Achieve Goals  Good    Potential Considerations  Ability to learn/carryover information;Family/community support;Previous level of function;Cooperation/participation level    SLP  Home Exercise Plan  Alternating, Divided  attention tasks    Consulted and Agree with Plan of Care  Patient;Family member/caregiver    Family Member Consulted  wife       Patient will benefit from skilled therapeutic intervention in order to improve the following deficits and impairments:   Cognitive communication deficit    Problem List Patient Active Problem List   Diagnosis Date Noted  . Weakness 06/07/2017  . PAD (peripheral artery disease) ([facility]) 09/07/2014  .  Diabetes mellitus ([place]) 09/21/2011  . Hyperlipidemia 03/25/2009  . CAD, NATIVE VESSEL 03/25/2009  . CHEST PAIN-PRECORDIAL 03/25/2009    Rulon Eisenmenger, MA, CCC-SLP 06/19/2019, 1:03 PM  [place] MAIN [facility] SERVICES [address] [place], [place], [address] Phone: [phone]   Fax:  [phone]   Name: [name] [name]. MRN: [id number] Date of Birth: 1934-07-12

## 2019-06-23 ENCOUNTER — Ambulatory Visit: Payer: Medicare Other | Admitting: Speech Pathology

## 2019-06-26 ENCOUNTER — Encounter: Payer: Self-pay | Admitting: Speech Pathology

## 2019-06-26 ENCOUNTER — Other Ambulatory Visit: Payer: Self-pay

## 2019-06-26 ENCOUNTER — Ambulatory Visit: Payer: Medicare Other | Admitting: Speech Pathology

## 2019-06-26 DIAGNOSIS — R41841 Cognitive communication deficit: Secondary | ICD-10-CM

## 2019-06-26 NOTE — Therapy (Signed)
[place] [facility] MAIN [facility] SERVICES [address] [place], [place], [address] Phone: [phone]   Fax:  [phone]  Speech Language Pathology Treatment  Patient Details  Name: [name] [name]. MRN: [id number] Date of Birth: 1933-11-11 No data recorded  Encounter Date: 06/26/2019  End of Session - 06/26/19 1558    Visit Number  8    Number of Visits  17    Date for SLP Re-Evaluation  07/29/19    Authorization Time Period  3/10 till Medicare progress note    Authorization - Visit Number  7    Authorization - Number of Visits  10    SLP Start Time  1000    SLP Stop Time   1051    SLP Time Calculation (min)  51 min    Activity Tolerance  Patient tolerated treatment well       Past Medical History:  Diagnosis Date  . CAD (coronary artery disease)    non-obstructive by cath in 2000 - Myoview in 2008 negative. EF 57% - ETT in 8/10. Walked 9:17 on [place] w no CPor ST-T changes  . CAD (coronary artery disease)   . Diabetes mellitus   . GERD (gastroesophageal reflux disease)   . Glaucoma   . Hyperlipidemia   . Iron deficiency anemia   . Memory loss   . Osteoporosis     Past Surgical History:  Procedure Laterality Date  . ANTERIOR CRUCIATE LIGAMENT REPAIR    . Cath (other)    . colon polyps removed     x2  . removal r eye      There were no vitals filed for this visit.  Subjective Assessment - 06/26/19 1557    Subjective  Pt was pleasant and cooperative, steadily working on all tx tasks, did not appear to fatigue as in previous sessions.    Patient is accompained by:  Family member   wife   Currently in Pain?  No/denies            ADULT SLP TREATMENT - 06/26/19 0001      General Information   Behavior/Cognition  Alert;Cooperative;Pleasant mood    Patient Positioning  Upright in chair    HPI  Pt is here to further investigate memory issues that have come on gradually. Wife reports decreased concentration and difficulty  multi-tasking, i/e talking while driving, or attempting to do things amongst distractions. Wife notes pt has difficulty following conversations, learning new information, decreased concentration, short term memory deficits. Pt has a sleep impairment (REM behavior disorder-Periodic limb movement disorder). He is active, walking and swimming 3x/week to keep physically active.Pt HOH with bilateral hearing aids. Pt and wife report they have only just recently been told pt has had a CVA, upon review of MRI results taken approx 1 year ago, which is consistent with an episode that began on return flight from [place] about a year ago, pt became disoriented, was making statements that didn't quite make sense and following a long walk collapsed in his driveway. After neighbors saw him and asked if he was okay, he told them he was just resting and proceded to go inside and tell his wife to call the doctor that something was not right. Upon recent visit to PCP, he was referred to neurology Dr. Sherryll Burger who referred pt here to manage new episodes of memory loss.      Treatment Provided   Treatment provided  Cognitive-Linquistic      Pain Assessment  Pain Assessment  No/denies pain      Cognitive-Linquistic Treatment   Treatment focused on  Cognition    Skilled Treatment  Pt performed divided attention trail making tasks, field of 2 sets of 10 alternating stimuli with 100% accuracy and intermittent min verbal cues. Pt performed additional divided attention task, sorting and organizing a line of numbers into odd/even in ascending order with 100% accuracy given occasional verbal cues. Pt answered wh questions following listening to a 3 sentence paragraph with 80% accuracy using self generated written notes. Pt unable to perform task without written notes.       Assessment / Recommendations / Plan   Plan  Continue with current plan of care      Progression Toward Goals   Progression toward goals  Progressing toward  goals       SLP Education - 06/26/19 1558    Education Details  re: compensatory strategies to aid short term recall, divided & selective attention    Person(s) Educated  Patient    Methods  Explanation;Demonstration;Verbal cues    Comprehension  Verbalized understanding;Need further instruction;Returned demonstration;Verbal cues required         SLP Long Term Goals - 05/29/19 1547      SLP LONG TERM GOAL #1   Title  Pt will use external memory aids and compensatory strategies to recall details re: routine and recent events given min cues.    Time  8    Period  Weeks    Status  New    Target Date  07/29/19      SLP LONG TERM GOAL #2   Title  Pt will complete auditory attention/vigilance  memory tasks with 80% accuracy given min cues.    Time  8    Period  Weeks    Status  New    Target Date  07/29/19      SLP LONG TERM GOAL #3   Title  After listening to 1-3 sentence paragraph, the patient will answer simple yes/no and "wh" questions with 80% accuracy given min cues.    Time  8    Period  Weeks    Status  New    Target Date  07/29/19       Plan - 06/26/19 1559    Clinical Impression Statement  Noted improved independence and speed with divided attention tasks this session requiring a decreased level of cueing. Continues to require written notes to perform short term recall tasks with accuracy.  Pt will continue to benefit from skilled ST services to improve functional memory & communication skills; however pt and wife report wishes to d/c tx at this time and report they prefer to practice compensatory strategies learned in tx at home. Pt & wife reported they will contact pt's MD if they desire re-evaluation and further tx.   Speech Therapy Frequency  2x / week    Duration  Other (comment)   8 weeks   Treatment/Interventions  SLP instruction and feedback;Functional tasks;Compensatory strategies;Patient/family education;Internal/external aids;Cognitive reorganization;Cueing  hierarchy    Potential to Achieve Goals  Good    Potential Considerations  Ability to learn/carryover information;Family/community support;Previous level of function;Cooperation/participation level    SLP Home Exercise Plan  Alternating, Divided  attention tasks    Consulted and Agree with Plan of Care  Patient;Family member/caregiver    Family Member Consulted  wife       Patient will benefit from skilled therapeutic intervention in order to improve the following deficits and  impairments:   Cognitive communication deficit    Problem List Patient Active Problem List   Diagnosis Date Noted  . Weakness 06/07/2017  . PAD (peripheral artery disease) ([facility]) 09/07/2014  . Diabetes mellitus ([facility]) 09/21/2011  . Hyperlipidemia 03/25/2009  . CAD, NATIVE VESSEL 03/25/2009  . CHEST PAIN-PRECORDIAL 03/25/2009    Jeannette HowNovak,Teller Wakefield, MA, CCC-SLP 06/26/2019, 4:19 PM  [place] [facility] [address] [place], [place], [address] Phone: [phone]   Fax:  [phone]   Name: [name] GravenHenry J Blattner Jr. MRN: [id number] Date of Birth: 02/24/1934

## 2019-07-07 ENCOUNTER — Encounter: Payer: Medicare Other | Admitting: Speech Pathology

## 2019-07-10 ENCOUNTER — Encounter: Payer: Medicare Other | Admitting: Speech Pathology

## 2019-07-14 ENCOUNTER — Encounter: Payer: Medicare Other | Admitting: Speech Pathology

## 2019-07-17 ENCOUNTER — Encounter: Payer: Medicare Other | Admitting: Speech Pathology

## 2019-07-21 ENCOUNTER — Encounter: Payer: Medicare Other | Admitting: Speech Pathology

## 2019-07-24 ENCOUNTER — Encounter: Payer: Medicare Other | Admitting: Speech Pathology

## 2019-10-21 ENCOUNTER — Inpatient Hospital Stay
Admission: EM | Admit: 2019-10-21 | Discharge: 2019-10-28 | DRG: 481 | Disposition: A | Discharge status: Home Health | Payer: Medicare PPO | Attending: Hospitalist | Admitting: Hospitalist

## 2019-10-21 ENCOUNTER — Emergency Department: Payer: Medicare PPO

## 2019-10-21 ENCOUNTER — Other Ambulatory Visit: Payer: Self-pay

## 2019-10-21 DIAGNOSIS — Z7982 Long term (current) use of aspirin: Secondary | ICD-10-CM | POA: Diagnosis not present

## 2019-10-21 DIAGNOSIS — G2581 Restless legs syndrome: Secondary | ICD-10-CM | POA: Diagnosis present

## 2019-10-21 DIAGNOSIS — Z8249 Family history of ischemic heart disease and other diseases of the circulatory system: Secondary | ICD-10-CM | POA: Diagnosis not present

## 2019-10-21 DIAGNOSIS — K219 Gastro-esophageal reflux disease without esophagitis: Secondary | ICD-10-CM | POA: Diagnosis present

## 2019-10-21 DIAGNOSIS — I639 Cerebral infarction, unspecified: Secondary | ICD-10-CM

## 2019-10-21 DIAGNOSIS — F05 Delirium due to known physiological condition: Secondary | ICD-10-CM | POA: Diagnosis not present

## 2019-10-21 DIAGNOSIS — Y9239 Other specified sports and athletic area as the place of occurrence of the external cause: Secondary | ICD-10-CM | POA: Diagnosis not present

## 2019-10-21 DIAGNOSIS — Z419 Encounter for procedure for purposes other than remedying health state, unspecified: Secondary | ICD-10-CM

## 2019-10-21 DIAGNOSIS — D509 Iron deficiency anemia, unspecified: Secondary | ICD-10-CM | POA: Diagnosis present

## 2019-10-21 DIAGNOSIS — Y93B1 Activity, exercise machines primarily for muscle strengthening: Secondary | ICD-10-CM | POA: Diagnosis not present

## 2019-10-21 DIAGNOSIS — W010XXA Fall on same level from slipping, tripping and stumbling without subsequent striking against object, initial encounter: Secondary | ICD-10-CM | POA: Diagnosis present

## 2019-10-21 DIAGNOSIS — Z79899 Other long term (current) drug therapy: Secondary | ICD-10-CM | POA: Diagnosis not present

## 2019-10-21 DIAGNOSIS — H409 Unspecified glaucoma: Secondary | ICD-10-CM | POA: Diagnosis present

## 2019-10-21 DIAGNOSIS — S72001A Fracture of unspecified part of neck of right femur, initial encounter for closed fracture: Secondary | ICD-10-CM

## 2019-10-21 DIAGNOSIS — Z823 Family history of stroke: Secondary | ICD-10-CM

## 2019-10-21 DIAGNOSIS — F329 Major depressive disorder, single episode, unspecified: Secondary | ICD-10-CM | POA: Diagnosis present

## 2019-10-21 DIAGNOSIS — Z8673 Personal history of transient ischemic attack (TIA), and cerebral infarction without residual deficits: Secondary | ICD-10-CM

## 2019-10-21 DIAGNOSIS — Z794 Long term (current) use of insulin: Secondary | ICD-10-CM

## 2019-10-21 DIAGNOSIS — Z20822 Contact with and (suspected) exposure to covid-19: Secondary | ICD-10-CM | POA: Diagnosis present

## 2019-10-21 DIAGNOSIS — S72009A Fracture of unspecified part of neck of unspecified femur, initial encounter for closed fracture: Secondary | ICD-10-CM | POA: Diagnosis present

## 2019-10-21 DIAGNOSIS — H9190 Unspecified hearing loss, unspecified ear: Secondary | ICD-10-CM | POA: Diagnosis present

## 2019-10-21 DIAGNOSIS — Z86010 Personal history of colonic polyps: Secondary | ICD-10-CM

## 2019-10-21 DIAGNOSIS — W19XXXA Unspecified fall, initial encounter: Secondary | ICD-10-CM

## 2019-10-21 DIAGNOSIS — M810 Age-related osteoporosis without current pathological fracture: Secondary | ICD-10-CM | POA: Diagnosis present

## 2019-10-21 DIAGNOSIS — Z8261 Family history of arthritis: Secondary | ICD-10-CM

## 2019-10-21 DIAGNOSIS — K5900 Constipation, unspecified: Secondary | ICD-10-CM | POA: Diagnosis not present

## 2019-10-21 DIAGNOSIS — S72141A Displaced intertrochanteric fracture of right femur, initial encounter for closed fracture: Principal | ICD-10-CM | POA: Diagnosis present

## 2019-10-21 DIAGNOSIS — Z888 Allergy status to other drugs, medicaments and biological substances status: Secondary | ICD-10-CM

## 2019-10-21 DIAGNOSIS — E119 Type 2 diabetes mellitus without complications: Secondary | ICD-10-CM | POA: Diagnosis present

## 2019-10-21 DIAGNOSIS — Z885 Allergy status to narcotic agent status: Secondary | ICD-10-CM | POA: Diagnosis not present

## 2019-10-21 DIAGNOSIS — I2510 Atherosclerotic heart disease of native coronary artery without angina pectoris: Secondary | ICD-10-CM | POA: Diagnosis present

## 2019-10-21 DIAGNOSIS — E785 Hyperlipidemia, unspecified: Secondary | ICD-10-CM | POA: Diagnosis present

## 2019-10-21 DIAGNOSIS — M25551 Pain in right hip: Secondary | ICD-10-CM

## 2019-10-21 DIAGNOSIS — F0390 Unspecified dementia without behavioral disturbance: Secondary | ICD-10-CM | POA: Diagnosis present

## 2019-10-21 LAB — BASIC METABOLIC PANEL
Anion gap: 6 (ref 5–15)
BUN: 17 mg/dL (ref 8–23)
CO2: 27 mmol/L (ref 22–32)
Calcium: 9.6 mg/dL (ref 8.9–10.3)
Chloride: 103 mmol/L (ref 98–111)
Creatinine, Ser: 0.74 mg/dL (ref 0.61–1.24)
GFR calc Af Amer: >60 mL/min (ref >60)
GFR calc non Af Amer: >60 mL/min (ref >60)
Glucose, Bld: 122 mg/dL — ABNORMAL HIGH (ref 70–99)
Potassium: 4 mmol/L (ref 3.5–5.1)
Sodium: 136 mmol/L (ref 135–145)

## 2019-10-21 LAB — TYPE AND SCREEN
ABO/RH(D): A POS
Antibody Screen: NEGATIVE

## 2019-10-21 LAB — PROTIME-INR
INR: 1 (ref 0.8–1.2)
Prothrombin Time: 13 seconds (ref 11.4–15.2)

## 2019-10-21 LAB — CBC
HCT: 42.6 % (ref 39.0–52.0)
Hemoglobin: 14.8 g/dL (ref 13.0–17.0)
MCH: 30.6 pg (ref 26.0–34.0)
MCHC: 34.7 g/dL (ref 30.0–36.0)
MCV: 88 fL (ref 80.0–100.0)
Platelets: 178 10*3/uL (ref 150–400)
RBC: 4.84 MIL/uL (ref 4.22–5.81)
RDW: 12.5 % (ref 11.5–15.5)
WBC: 12.1 10*3/uL — ABNORMAL HIGH (ref 4.0–10.5)
nRBC: 0 % (ref 0.0–0.2)

## 2019-10-21 LAB — RESPIRATORY PANEL BY RT PCR (FLU A&B, COVID)
Influenza A by PCR: NEGATIVE
Influenza B by PCR: NEGATIVE
SARS Coronavirus 2 by RT PCR: NEGATIVE

## 2019-10-21 LAB — GLUCOSE, CAPILLARY: Glucose-Capillary: 306 mg/dL — ABNORMAL HIGH (ref 70–99)

## 2019-10-21 LAB — TROPONIN I (HIGH SENSITIVITY): Troponin I (High Sensitivity): 6 ng/L (ref <18)

## 2019-10-21 MED ORDER — METHOCARBAMOL 1000 MG/10ML IJ SOLN
500.0000 mg | Freq: Four times a day (QID) | INTRAVENOUS | Status: DC | PRN
  Filled 2019-10-21: qty 5

## 2019-10-21 MED ORDER — NITROGLYCERIN 0.4 MG SL SUBL: 0.4000 mg | SUBLINGUAL_TABLET | SUBLINGUAL | Status: DC | PRN

## 2019-10-21 MED ORDER — INSULIN ASPART 100 UNIT/ML ~~LOC~~ SOLN
0.0000 [IU] | Freq: Three times a day (TID) | SUBCUTANEOUS | Status: DC
  Administered 2019-10-22: 3 [IU] via SUBCUTANEOUS
  Administered 2019-10-23: 17:00:00 5 [IU] via SUBCUTANEOUS
  Administered 2019-10-23: 2 [IU] via SUBCUTANEOUS
  Administered 2019-10-23: 3 [IU] via SUBCUTANEOUS
  Administered 2019-10-24: 5 [IU] via SUBCUTANEOUS
  Administered 2019-10-24: 3 [IU] via SUBCUTANEOUS
  Administered 2019-10-24: 5 [IU] via SUBCUTANEOUS
  Administered 2019-10-25: 2 [IU] via SUBCUTANEOUS
  Administered 2019-10-25: 8 [IU] via SUBCUTANEOUS
  Administered 2019-10-25: 12:00:00 3 [IU] via SUBCUTANEOUS
  Administered 2019-10-26: 5 [IU] via SUBCUTANEOUS
  Administered 2019-10-26 (×2): 3 [IU] via SUBCUTANEOUS
  Administered 2019-10-27 (×3): 5 [IU] via SUBCUTANEOUS
  Administered 2019-10-28: 3 [IU] via SUBCUTANEOUS
  Administered 2019-10-28: 11 [IU] via SUBCUTANEOUS
  Filled 2019-10-21 (×19): qty 1

## 2019-10-21 MED ORDER — ACETAMINOPHEN 325 MG PO TABS: 650.0000 mg | ORAL_TABLET | Freq: Four times a day (QID) | ORAL | Status: DC | PRN

## 2019-10-21 MED ORDER — INSULIN DETEMIR 100 UNIT/ML ~~LOC~~ SOLN
10.0000 [IU] | Freq: Every day | SUBCUTANEOUS | Status: DC
  Administered 2019-10-21 – 2019-10-27 (×7): 10 [IU] via SUBCUTANEOUS
  Filled 2019-10-21 (×12): qty 0.1

## 2019-10-21 MED ORDER — ASPIRIN EC 81 MG PO TBEC: 81.0000 mg | DELAYED_RELEASE_TABLET | Freq: Every day | ORAL | Status: DC

## 2019-10-21 MED ORDER — CEFAZOLIN SODIUM-DEXTROSE 2-4 GM/100ML-% IV SOLN
2.0000 g | INTRAVENOUS | Status: AC
  Administered 2019-10-22: 14:00:00 2 g via INTRAVENOUS

## 2019-10-21 MED ORDER — METHOCARBAMOL 500 MG PO TABS: 500.0000 mg | ORAL_TABLET | Freq: Four times a day (QID) | ORAL | Status: DC | PRN

## 2019-10-21 MED ORDER — INSULIN DETEMIR 100 UNIT/ML ~~LOC~~ SOLN
5.0000 [IU] | Freq: Every day | SUBCUTANEOUS | Status: DC
  Filled 2019-10-21: qty 0.05

## 2019-10-21 MED ORDER — ACETAMINOPHEN 500 MG PO TABS
1000.0000 mg | ORAL_TABLET | Freq: Once | ORAL | Status: AC
  Administered 2019-10-21: 1000 mg via ORAL
  Filled 2019-10-21: qty 2

## 2019-10-21 MED ORDER — SENNOSIDES-DOCUSATE SODIUM 8.6-50 MG PO TABS: 1.0000 | ORAL_TABLET | Freq: Every evening | ORAL | Status: DC | PRN

## 2019-10-21 MED ORDER — TRAMADOL HCL 50 MG PO TABS
50.0000 mg | ORAL_TABLET | Freq: Once | ORAL | Status: AC
  Administered 2019-10-21: 50 mg via ORAL
  Filled 2019-10-21: qty 1

## 2019-10-21 MED ORDER — OXYCODONE HCL 5 MG PO TABS
5.0000 mg | ORAL_TABLET | ORAL | Status: DC | PRN
  Administered 2019-10-21 – 2019-10-22 (×2): 5 mg via ORAL
  Filled 2019-10-21 (×2): qty 1

## 2019-10-21 MED ORDER — OXYCODONE HCL 5 MG PO TABS: 10.0000 mg | ORAL_TABLET | ORAL | Status: DC | PRN

## 2019-10-21 MED ORDER — SERTRALINE HCL 50 MG PO TABS: 50.0000 mg | ORAL_TABLET | Freq: Every day | ORAL | Status: DC

## 2019-10-21 MED ORDER — PRAMIPEXOLE DIHYDROCHLORIDE 0.25 MG PO TABS
0.1250 mg | ORAL_TABLET | Freq: Every day | ORAL | Status: DC
  Administered 2019-10-21 – 2019-10-27 (×7): 0.125 mg via ORAL
  Filled 2019-10-21 (×9): qty 0.5

## 2019-10-21 MED ORDER — PANTOPRAZOLE SODIUM 40 MG PO TBEC
40.0000 mg | DELAYED_RELEASE_TABLET | Freq: Every day | ORAL | Status: DC
  Administered 2019-10-21 – 2019-10-28 (×7): 40 mg via ORAL
  Filled 2019-10-21 (×7): qty 1

## 2019-10-21 MED ORDER — INSULIN ASPART 100 UNIT/ML ~~LOC~~ SOLN
0.0000 [IU] | Freq: Every day | SUBCUTANEOUS | Status: DC
  Administered 2019-10-21: 3 [IU] via SUBCUTANEOUS
  Administered 2019-10-22 – 2019-10-24 (×3): 2 [IU] via SUBCUTANEOUS
  Administered 2019-10-26 – 2019-10-27 (×2): 3 [IU] via SUBCUTANEOUS
  Filled 2019-10-21 (×6): qty 1

## 2019-10-21 MED ORDER — SERTRALINE HCL 50 MG PO TABS
50.0000 mg | ORAL_TABLET | Freq: Every day | ORAL | Status: DC
  Administered 2019-10-21 – 2019-10-27 (×7): 50 mg via ORAL
  Filled 2019-10-21 (×7): qty 1

## 2019-10-21 MED ORDER — SIMVASTATIN 20 MG PO TABS
40.0000 mg | ORAL_TABLET | Freq: Every day | ORAL | Status: DC
  Administered 2019-10-21 – 2019-10-27 (×7): 40 mg via ORAL
  Filled 2019-10-21 (×3): qty 2
  Filled 2019-10-21: qty 4
  Filled 2019-10-21 (×4): qty 2
  Filled 2019-10-21: qty 4

## 2019-10-21 NOTE — ED Triage Notes (Signed)
Reports fall off exercise equipment. Right upper leg pain since. Denies hip pain. Pt alert and oriented X4, cooperative, RR even and unlabored, color WNL. Pt in NAD.

## 2019-10-21 NOTE — ED Provider Notes (Signed)
EKG interpreted by me, sinus rhythm rate of 73 bpm, normal PR interval, normal QRS, normal QT   Emily Filbert, MD 10/21/19 1745

## 2019-10-21 NOTE — H&P (Signed)
History and Physical    [name] [name]. RCV:[id number] DOB: 12-14-1933 DOA: 10/21/2019  PCP: Danella Penton, MD  Patient coming from: Home  I have personally briefly reviewed patient's old medical records in [facility]  Chief Complaint: Hip fracture  HPI: [name] [name]. is a 86 y.o. male with medical history significant of CVA, CAD, GERD, glaucoma, hyperlipidemia, insulin-dependent diabetes who presents for evaluation after slip and fall while at the gym and a resultant intertrochanteric right hip fracture.  The patient denies any prodromal syncopal events.  Denies any loss of consciousness.  Denies any head trauma.  On my evaluation the patient is resting comfortably in bed.  He states his wife is at bedside.  He answers all questions appropriately.  The patient is hard of hearing but is able to communicate answers all questions appropriately.  Vital signs are stable.  Orthopedics consulted from the emergency department with plans to take the patient to the operating room tomorrow 10/22/2019.  ED Course: Patient was stabilized.  EKG was taken which demonstrated sinus rhythm.  Labs were drawn and are pending at time of this note.  Imaging confirmed hip fracture and orthopedic surgery was consulted.  Recommended admission to the hospitalist service.  Hospitalist called for admission.  Review of Systems: As per HPI otherwise 10 point review of systems negative.    Past Medical History:  Diagnosis Date  . CAD (coronary artery disease)    non-obstructive by cath in 2000 - Myoview in 2008 negative. EF 57% - ETT in 8/10. Walked 9:17 on [place] w no CPor ST-T changes  . CAD (coronary artery disease)   . Diabetes mellitus   . GERD (gastroesophageal reflux disease)   . Glaucoma   . Hyperlipidemia   . Iron deficiency anemia   . Memory loss   . Osteoporosis     Past Surgical History:  Procedure Laterality Date  . ANTERIOR CRUCIATE LIGAMENT REPAIR    . Cath (other)    . colon polyps  removed     x2  . removal r eye       reports that he has never smoked. He has never used smokeless tobacco. He reports current alcohol use. He reports that he does not use drugs.  Allergies  Allergen Reactions  . Hydrocodone-Acetaminophen Rash    Family History  Problem Relation Age of Onset  . Stroke Mother   . Heart attack Father   . Arthritis Sister      Prior to Admission medications   Medication Sig Start Date End Date Taking? Authorizing Provider  aspirin 81 MG tablet Take 81 mg by mouth daily.      [provider]  EVENING PRIMROSE OIL PO Take 1 capsule by mouth daily.    [provider]  galantamine (RAZADYNE) 12 MG tablet Take 12 mg by mouth 2 (two) times daily.     [provider]  glimepiride (AMARYL) 4 MG tablet Take 4 mg by mouth 2 (two) times daily.     [provider]  Glucosamine-Chondroitin 500-400 MG CAPS Take by mouth daily. Take 3 tabs     [provider]  Insulin Detemir (LEVEMIR FLEXPEN) 100 UNIT/ML Pen Inject 15 Units into the skin 2 (two) times daily.     [provider]  latanoprost (XALATAN) 0.005 % ophthalmic solution Place 1 drop into the left eye at bedtime.    [provider]  Multiple Vitamin (MULTIVITAMIN) tablet Take 1 tablet by mouth  daily.    [provider]  NITROSTAT 0.4 MG SL tablet TAKE ONE TABLET UNDER THE TONGUE EVERY 5 MINUTES FOR CHEST PAIN. IF NO RELIEF PAST 3RD TAB GO TO EMERGENCY ROOM 03/10/16   Antonieta Iba, MD  Omega-3 Fatty Acids (FISH OIL PO) Take 1 capsule by mouth daily.    [provider]  omeprazole (PRILOSEC) 20 MG capsule Take 20 mg by mouth daily.      [provider]  sertraline (ZOLOFT) 50 MG tablet Take 50 mg by mouth daily.    [provider]  simvastatin (ZOCOR) 40 MG tablet Take 40 mg by mouth at bedtime.      [provider]    Physical Exam: Vitals:   10/21/19 1602 10/21/19 1603  BP: (!) 160/60     Pulse: 68   Resp: 18   Temp: 97.6 F (36.4 C)   TempSrc: Oral   SpO2: 98%   Weight:  77.1 kg  Height:  6' (1.829 m)    Constitutional: NAD, calm, comfortable Vitals:   10/21/19 1602 10/21/19 1603  BP: (!) 160/60   Pulse: 68   Resp: 18   Temp: 97.6 F (36.4 C)   TempSrc: Oral   SpO2: 98%   Weight:  77.1 kg  Height:  6' (1.829 m)   Eyes: PERRL, lids and conjunctivae normal ENMT: Mucous membranes are moist. Posterior pharynx clear of any exudate or lesions.Normal dentition.  Neck: normal, supple, no masses, no thyromegaly Respiratory: clear to auscultation bilaterally, no wheezing, no crackles. Normal respiratory effort. No accessory muscle use.  Cardiovascular: Regular rate and rhythm, no murmurs / rubs / gallops. No extremity edema. 2+ pedal pulses. No carotid bruits.  Abdomen: no tenderness, no masses palpated. No hepatosplenomegaly. Bowel sounds positive.  Musculoskeletal: Right hip shortened and externally rotated.  Good peripheral pulses  skin: no rashes, lesions, ulcers. No induration Neurologic: CN 2-12 grossly intact. Sensation intact, DTR normal. Strength 5/5 in all 4.  Psychiatric: Normal judgment and insight. Alert and oriented x 3. Normal mood.     Labs on Admission: I have personally reviewed following labs and imaging studies  CBC: No results for input(s): WBC, NEUTROABS, HGB, HCT, MCV, PLT in the last 168 hours. Basic Metabolic Panel: No results for input(s): NA, K, CL, CO2, GLUCOSE, BUN, CREATININE, CALCIUM, MG, PHOS in the last 168 hours. GFR: CrCl cannot be calculated (Patient's most recent lab result is older than the maximum 21 days allowed.). Liver Function Tests: No results for input(s): AST, ALT, ALKPHOS, BILITOT, PROT, ALBUMIN in the last 168 hours. No results for input(s): LIPASE, AMYLASE in the last 168 hours. No results for input(s): AMMONIA in the last 168 hours. Coagulation Profile: No results for input(s): INR, PROTIME in the last 168  hours. Cardiac Enzymes: No results for input(s): CKTOTAL, CKMB, CKMBINDEX, TROPONINI in the last 168 hours. BNP (last 3 results) No results for input(s): PROBNP in the last 8760 hours. HbA1C: No results for input(s): HGBA1C in the last 72 hours. CBG: No results for input(s): GLUCAP in the last 168 hours. Lipid Profile: No results for input(s): CHOL, HDL, LDLCALC, TRIG, CHOLHDL, LDLDIRECT in the last 72 hours. Thyroid Function Tests: No results for input(s): TSH, T4TOTAL, FREET4, T3FREE, THYROIDAB in the last 72 hours. Anemia Panel: No results for input(s): VITAMINB12, FOLATE, FERRITIN, TIBC, IRON, RETICCTPCT in the last 72 hours. Urine analysis:    Component Value Date/Time   COLORURINE AMBER (A) 06/07/2017 1607   APPEARANCEUR CLOUDY (A)  06/07/2017 1607   LABSPEC 1.011 06/07/2017 1607   PHURINE 6.0 06/07/2017 1607   GLUCOSEU 150 (A) 06/07/2017 1607   HGBUR NEGATIVE 06/07/2017 1607   BILIRUBINUR NEGATIVE 06/07/2017 1607   KETONESUR NEGATIVE 06/07/2017 1607   PROTEINUR 30 (A) 06/07/2017 1607   NITRITE NEGATIVE 06/07/2017 1607   LEUKOCYTESUR NEGATIVE 06/07/2017 1607    Radiological Exams on Admission: DG Chest 1 View  Result Date: 10/21/2019 CLINICAL DATA:  Fall. EXAM: CHEST  1 VIEW COMPARISON:  06/07/2017 FINDINGS: Cardiac enlargement without heart failure. Lungs are clear and well aerated. No infiltrate or effusion. Chronic right rib fractures. IMPRESSION: No active disease. Electronically Signed   By: Franchot Gallo M.D.   On: 10/21/2019 17:05   DG Hip Unilat W or Wo Pelvis 2-3 Views Right  Result Date: 10/21/2019 CLINICAL DATA:  Fall. EXAM: DG HIP (WITH OR WITHOUT PELVIS) 2-3V RIGHT COMPARISON:  None. FINDINGS: Intertrochanteric fracture right hip with mild angulation. Right hip joint normal. Diffuse arterial calcification. Degenerative change and spurring in the lumbar spine. IMPRESSION: Intertrochanteric fracture right femur. Electronically Signed   By: Franchot Gallo M.D.    On: 10/21/2019 17:04    EKG: Independently reviewed.  Normal sinus rhythm  Assessment/Plan Active Problems:   * No active hospital problems. *  Intertrochanteric hip fracture, right No syncopal event, secondary to mechanical fall Orthopedic surgery consulted from the ED Plan: Admit to MedSurg Multimodal pain control Hold anticoagulants SCDs for DVT prophylaxis N.p.o. after midnight Plan for OR tomorrow with Dr. Posey Pronto  Insulin-dependent diabetes Patient is on Tresiba flex touch 32 units at bedtime Also on Metformin 1000 mg daily Also on glimepiride Plan: Hold oral agents Lantus 10 units every afternoon Moderate sliding scale Carb modified diet when diet advanced  Coronary artery disease Continue daily aspirin Sublingual nitroglycerin  Hyperlipidemia Continue simvastatin  Depression Restless leg Sertraline 50 mg nightly Pramipexole 0.125 mg nightly  GERD Protonix 40 mg daily  History of CVA Daily ASA   DVT prophylaxis: SCDs consider chemoprophylaxis on postoperative day 1 Code Status: Full Family Communication: Wife at bedside  disposition Plan: Pending clinical course.  Patient scheduled for operative fixation on 10/22/2019 Consults called: Orthopedics-[facility] Admission status: Inpatient   Sidney Ace MD [facility] Pager [phone]  If 7PM-7AM, please contact night-coverage  10/21/2019, 5:52 PM

## 2019-10-21 NOTE — Progress Notes (Signed)
Full consult note and discussion with patient to follow tomorrow AM.  Called by ED staff. Imaging reviewed.  - Plan for surgery tomorrow, likely afternoon.  - NPO after midnight - Hold anticoagulation - Admit to Hospitalist team.  

## 2019-10-21 NOTE — ED Notes (Signed)
Patient and wife given food tray

## 2019-10-21 NOTE — ED Triage Notes (Signed)
First Nurse Note:  Arrives via [facility].  S/P fall off of exercise equipment at the [facility].  C/O right upper leg pain.   VSS.  NAD

## 2019-10-21 NOTE — ED Notes (Signed)
See triage note  Presents via EMS  S/p fall  Having pain to right hip and upper leg

## 2019-10-21 NOTE — Progress Notes (Signed)
Wife wheeled out to the emergency room exit to meet daughter. She was made aware that patient had been assigned a bed, number to unit nurses station given along with new room number.

## 2019-10-21 NOTE — Progress Notes (Signed)
Wife Peggy at bedside with lots of questions about patient stay and surgery tomorrow. Wife given this RN phone number to call and check on patient once she leaves. Wife advised of visiting hours. Wife will need to be present when patient signs consent in the morning. No orders currently for consent and patient nor family have spoken with Dr. Allena Katz yet. Notified wife that I would let the dayshift RN know she was needed for the consent and would like to be present to speak with Dr. Allena Katz to see if she would be able to be granted early access to come in the morning. Pharmacy reviewed all meds with patient. I reviewed plan of care, available pain meds. Offered patient an external catheter since wife reports he has urgency and wears and incontinence brief at home. Patient declined and said he would like to use the urinal. Disposable pad placed under him in case of an accident.

## 2019-10-21 NOTE — ED Provider Notes (Signed)
[facility] EMERGENCY DEPARTMENT Provider Note   CSN: [id number] Arrival date & time: 10/21/19  1536     History Chief Complaint  Patient presents with  . Fall    [name] [name]. is a 86 y.o. male presents to the emergency department for evaluation of right hip pain.  He points to the lateral aspect of the right hip.  States he has problems and pain with movement.  He suffered a mechanical fall just prior to arrival at the [facility], states he was performing crunches on a piece of equipment where his legs were elevated and he was lying at a decline, as he crunched up to reach the handle to get off the piece of equipment the handle fell off and patient fell onto his right hip.  He denies any other injury to his body, no headache neck or back pain.  He was brought to the hospital via EMS and has not attempted ambulation.  Pain is 5 out of 10.  He is not any medications for pain.  Patient overall is very active, continues to drive and performs all activities of daily living.  HPI     Past Medical History:  Diagnosis Date  . CAD (coronary artery disease)    non-obstructive by cath in 2000 - Myoview in 2008 negative. EF 57% - ETT in 8/10. Walked 9:17 on [place] w no CPor ST-T changes  . CAD (coronary artery disease)   . Diabetes mellitus   . GERD (gastroesophageal reflux disease)   . Glaucoma   . Hyperlipidemia   . Iron deficiency anemia   . Memory loss   . Osteoporosis     Patient Active Problem List   Diagnosis Date Noted  . Weakness 06/07/2017  . PAD (peripheral artery disease) ([place]) 09/07/2014  . Diabetes mellitus ([place]) 09/21/2011  . Hyperlipidemia 03/25/2009  . CAD, NATIVE VESSEL 03/25/2009  . CHEST PAIN-PRECORDIAL 03/25/2009    Past Surgical History:  Procedure Laterality Date  . ANTERIOR CRUCIATE LIGAMENT REPAIR    . Cath (other)    . colon polyps removed     x2  . removal r eye         Family History  Problem Relation Age of Onset  . Stroke  Mother   . Heart attack Father   . Arthritis Sister     Social History   Tobacco Use  . Smoking status: Never Smoker  . Smokeless tobacco: Never Used  Substance Use Topics  . Alcohol use: Yes    Alcohol/week: 0.0 - 1.0 standard drinks  . Drug use: No    Home Medications Prior to Admission medications   Medication Sig Start Date End Date Taking? Authorizing Provider  aspirin 81 MG tablet Take 81 mg by mouth daily.      [provider]  EVENING PRIMROSE OIL PO Take 1 capsule by mouth daily.    [provider]  galantamine (RAZADYNE) 12 MG tablet Take 12 mg by mouth 2 (two) times daily.     [provider]  glimepiride (AMARYL) 4 MG tablet Take 4 mg by mouth 2 (two) times daily.     [provider]  Glucosamine-Chondroitin 500-400 MG CAPS Take by mouth daily. Take 3 tabs     [provider]  Insulin Detemir (LEVEMIR FLEXPEN) 100 UNIT/ML Pen Inject 15 Units into the skin 2 (two) times daily.     [provider]  latanoprost (XALATAN) 0.005 % ophthalmic solution Place 1 drop  into the left eye at bedtime.    [provider]  Multiple Vitamin (MULTIVITAMIN) tablet Take 1 tablet by mouth daily.    [provider]  NITROSTAT 0.4 MG SL tablet TAKE ONE TABLET UNDER THE TONGUE EVERY 5 MINUTES FOR CHEST PAIN. IF NO RELIEF PAST 3RD TAB GO TO EMERGENCY ROOM 03/10/16   Antonieta Iba, MD  Omega-3 Fatty Acids (FISH OIL PO) Take 1 capsule by mouth daily.    [provider]  omeprazole (PRILOSEC) 20 MG capsule Take 20 mg by mouth daily.      [provider]  sertraline (ZOLOFT) 50 MG tablet Take 50 mg by mouth daily.    [provider]  simvastatin (ZOCOR) 40 MG tablet Take 40 mg by mouth at bedtime.      [provider]    Allergies    Hydrocodone-acetaminophen  Review of Systems   Review of Systems  Constitutional: Negative for fever.  Respiratory: Negative for shortness of breath.    Cardiovascular: Negative for chest pain.  Gastrointestinal: Negative for abdominal pain, nausea and vomiting.  Musculoskeletal: Positive for arthralgias. Negative for back pain and neck pain.  Skin: Negative for wound.  Neurological: Negative for dizziness, light-headedness, numbness and headaches.    Physical Exam Updated Vital Signs BP (!) 160/60 (BP Location: Left Arm)   Pulse 68   Temp 97.6 F (36.4 C) (Oral)   Resp 18   Ht 6' (1.829 m)   Wt 77.1 kg   SpO2 98%   BMI 23.06 kg/m   Physical Exam Constitutional:      Appearance: He is well-developed.  HENT:     Head: Normocephalic and atraumatic.  Eyes:     Conjunctiva/sclera: Conjunctivae normal.  Cardiovascular:     Rate and Rhythm: Normal rate.  Pulmonary:     Effort: Pulmonary effort is normal. No respiratory distress.  Abdominal:     General: There is no distension.     Palpations: Abdomen is soft.     Tenderness: There is no abdominal tenderness. There is no guarding.  Musculoskeletal:     Cervical back: Normal range of motion.     Comments: Patient tender along the lateral aspect of the right hip.  No tenderness of the groin.  Leg lengths are equal, slight external rotation of the right lower extremity.  He has significant pain with attempted hip internal and external rotation.  Right knee with no swelling or effusion.  He is able to straight leg raise.  Able to ankle plantarflex and dorsiflex.  Neurovascular tact in right lower extremity.  No cervical thoracic or lumbar spinous process tenderness.  Skin:    General: Skin is warm.     Findings: No rash.  Neurological:     Mental Status: He is alert and oriented to person, place, and time.  Psychiatric:        Behavior: Behavior normal.        Thought Content: Thought content normal.     ED Results / Procedures / Treatments   Labs (all labs ordered are listed, but only abnormal results are displayed) Labs Reviewed  RESPIRATORY PANEL BY RT PCR (FLU A&B,  COVID)  CBC  BASIC METABOLIC PANEL  TROPONIN I (HIGH SENSITIVITY)    EKG None  Radiology DG Chest 1 View  Result Date: 10/21/2019 CLINICAL DATA:  Fall. EXAM: CHEST  1 VIEW COMPARISON:  06/07/2017 FINDINGS: Cardiac enlargement without heart failure. Lungs are clear and well aerated. No infiltrate or  effusion. Chronic right rib fractures. IMPRESSION: No active disease. Electronically Signed   By: Marlan Palau M.D.   On: 10/21/2019 17:05   DG Hip Unilat W or Wo Pelvis 2-3 Views Right  Result Date: 10/21/2019 CLINICAL DATA:  Fall. EXAM: DG HIP (WITH OR WITHOUT PELVIS) 2-3V RIGHT COMPARISON:  None. FINDINGS: Intertrochanteric fracture right hip with mild angulation. Right hip joint normal. Diffuse arterial calcification. Degenerative change and spurring in the lumbar spine. IMPRESSION: Intertrochanteric fracture right femur. Electronically Signed   By: Marlan Palau M.D.   On: 10/21/2019 17:04    Procedures Procedures (including critical care time)  Medications Ordered in ED Medications  acetaminophen (TYLENOL) tablet 1,000 mg (has no administration in time range)  traMADol (ULTRAM) tablet 50 mg (has no administration in time range)    ED Course  I have reviewed the triage vital signs and the nursing notes.  Pertinent labs & imaging results that were available during my care of the patient were reviewed by me and considered in my medical decision making (see chart for details).    MDM Rules/Calculators/A&P                       86 year old male with fall and injury to the right hip just prior to arrival.  X-rays the right hip show slightly displaced intertrochanteric fracture.  Patient given Tylenol and tramadol for pain.  IV started, orders placed for chest x-ray, labs, EKG.  Will discuss case with hospitalist and orthopedics.  Patient's pain well controlled.  Denies any other complaints at this time.  Physical exam unremarkable for any other injuries.   Discussed case with  orthopedist.  Will work on getting patient admitted to the hospital.  Orthopedics plans on performing surgery tomorrow afternoon.  Final Clinical Impression(s) / ED Diagnoses Final diagnoses:  Fall, initial encounter  Right hip pain  Intertrochanteric fracture of right hip, closed, initial encounter [facility])    Rx / DC Orders ED Discharge Orders    None       Ronnette Juniper 10/21/19 1728    Concha Se, MD 10/21/19 1815

## 2019-10-21 NOTE — Progress Notes (Signed)
Report called to Daria on 1A. She is not quite ready for the patient and will call back when she is.

## 2019-10-22 ENCOUNTER — Encounter
Admission: EM | Disposition: A | Discharge status: Home Health | Payer: Self-pay | Source: Home / Self Care | Attending: Hospitalist

## 2019-10-22 ENCOUNTER — Inpatient Hospital Stay: Payer: Medicare PPO

## 2019-10-22 ENCOUNTER — Inpatient Hospital Stay: Payer: Medicare PPO | Admitting: Anesthesiology

## 2019-10-22 ENCOUNTER — Encounter: Payer: Self-pay | Admitting: Internal Medicine

## 2019-10-22 HISTORY — PX: INTRAMEDULLARY (IM) NAIL INTERTROCHANTERIC: SHX5875

## 2019-10-22 LAB — CBC WITH DIFFERENTIAL/PLATELET
Abs Immature Granulocytes: 0.07 10*3/uL (ref 0.00–0.07)
Basophils Absolute: 0 10*3/uL (ref 0.0–0.1)
Basophils Relative: 0 %
Eosinophils Absolute: 0.1 10*3/uL (ref 0.0–0.5)
Eosinophils Relative: 1 %
HCT: 41.6 % (ref 39.0–52.0)
Hemoglobin: 14.5 g/dL (ref 13.0–17.0)
Immature Granulocytes: 1 %
Lymphocytes Relative: 17 %
Lymphs Abs: 2.1 10*3/uL (ref 0.7–4.0)
MCH: 30.5 pg (ref 26.0–34.0)
MCHC: 34.9 g/dL (ref 30.0–36.0)
MCV: 87.4 fL (ref 80.0–100.0)
Monocytes Absolute: 1.2 10*3/uL — ABNORMAL HIGH (ref 0.1–1.0)
Monocytes Relative: 10 %
Neutro Abs: 9.1 10*3/uL — ABNORMAL HIGH (ref 1.7–7.7)
Neutrophils Relative %: 71 %
Platelets: 186 10*3/uL (ref 150–400)
RBC: 4.76 MIL/uL (ref 4.22–5.81)
RDW: 12.6 % (ref 11.5–15.5)
WBC: 12.6 10*3/uL — ABNORMAL HIGH (ref 4.0–10.5)
nRBC: 0 % (ref 0.0–0.2)

## 2019-10-22 LAB — PROTIME-INR
INR: 1.1 (ref 0.8–1.2)
Prothrombin Time: 13.7 seconds (ref 11.4–15.2)

## 2019-10-22 LAB — GLUCOSE, CAPILLARY
Glucose-Capillary: 236 mg/dL — ABNORMAL HIGH (ref 70–99)
Glucose-Capillary: 196 mg/dL — ABNORMAL HIGH (ref 70–99)
Glucose-Capillary: 192 mg/dL — ABNORMAL HIGH (ref 70–99)
Glucose-Capillary: 138 mg/dL — ABNORMAL HIGH (ref 70–99)
Glucose-Capillary: 127 mg/dL — ABNORMAL HIGH (ref 70–99)
Glucose-Capillary: 237 mg/dL — ABNORMAL HIGH (ref 70–99)

## 2019-10-22 LAB — HEMOGLOBIN A1C
Hgb A1c MFr Bld: 9 % — ABNORMAL HIGH (ref 4.8–5.6)
Mean Plasma Glucose: 211.6 mg/dL

## 2019-10-22 LAB — BASIC METABOLIC PANEL
Anion gap: 8 (ref 5–15)
BUN: 17 mg/dL (ref 8–23)
CO2: 26 mmol/L (ref 22–32)
Calcium: 9.2 mg/dL (ref 8.9–10.3)
Chloride: 99 mmol/L (ref 98–111)
Creatinine, Ser: 0.67 mg/dL (ref 0.61–1.24)
GFR calc Af Amer: >60 mL/min (ref >60)
GFR calc non Af Amer: >60 mL/min (ref >60)
Glucose, Bld: 208 mg/dL — ABNORMAL HIGH (ref 70–99)
Potassium: 4 mmol/L (ref 3.5–5.1)
Sodium: 133 mmol/L — ABNORMAL LOW (ref 135–145)

## 2019-10-22 LAB — MRSA PCR SCREENING: MRSA by PCR: NEGATIVE

## 2019-10-22 LAB — TROPONIN I (HIGH SENSITIVITY): Troponin I (High Sensitivity): 5 ng/L (ref <18)

## 2019-10-22 SURGERY — FIXATION, FRACTURE, INTERTROCHANTERIC, WITH INTRAMEDULLARY ROD
Anesthesia: Spinal | Site: Hip | Laterality: Right

## 2019-10-22 MED ORDER — BUPIVACAINE LIPOSOME 1.3 % IJ SUSP
INTRAMUSCULAR | Status: DC | PRN
  Administered 2019-10-22: 15:00:00 50 mL

## 2019-10-22 MED ORDER — SODIUM CHLORIDE 0.9 % IV SOLN: INTRAVENOUS | Status: DC | PRN

## 2019-10-22 MED ORDER — ONDANSETRON HCL 4 MG PO TABS: 4.0000 mg | ORAL_TABLET | Freq: Four times a day (QID) | ORAL | Status: DC | PRN

## 2019-10-22 MED ORDER — METOCLOPRAMIDE HCL 10 MG PO TABS: 5.0000 mg | ORAL_TABLET | Freq: Three times a day (TID) | ORAL | Status: DC | PRN

## 2019-10-22 MED ORDER — METHOCARBAMOL 1000 MG/10ML IJ SOLN
500.0000 mg | Freq: Four times a day (QID) | INTRAVENOUS | Status: DC | PRN
  Filled 2019-10-22 (×2): qty 5

## 2019-10-22 MED ORDER — METHOCARBAMOL 500 MG PO TABS
500.0000 mg | ORAL_TABLET | Freq: Four times a day (QID) | ORAL | Status: DC | PRN
  Administered 2019-10-23 – 2019-10-24 (×2): 500 mg via ORAL
  Filled 2019-10-22 (×2): qty 1

## 2019-10-22 MED ORDER — ACETAMINOPHEN 500 MG PO TABS
1000.0000 mg | ORAL_TABLET | Freq: Three times a day (TID) | ORAL | Status: AC
  Administered 2019-10-22 – 2019-10-23 (×2): 1000 mg via ORAL
  Filled 2019-10-22 (×4): qty 2

## 2019-10-22 MED ORDER — METOCLOPRAMIDE HCL 5 MG/ML IJ SOLN
5.0000 mg | Freq: Three times a day (TID) | INTRAMUSCULAR | Status: DC | PRN
  Administered 2019-10-27: 10 mg via INTRAVENOUS
  Filled 2019-10-22: qty 2

## 2019-10-22 MED ORDER — PROPOFOL 500 MG/50ML IV EMUL
INTRAVENOUS | Status: DC | PRN
  Administered 2019-10-22: 25 ug/kg/min via INTRAVENOUS

## 2019-10-22 MED ORDER — OXYCODONE HCL 5 MG PO TABS: 2.5000 mg | ORAL_TABLET | ORAL | Status: DC | PRN

## 2019-10-22 MED ORDER — NEOMYCIN-POLYMYXIN B GU 40-200000 IR SOLN
Status: AC
  Filled 2019-10-22: qty 20

## 2019-10-22 MED ORDER — ONDANSETRON HCL 4 MG/2ML IJ SOLN
4.0000 mg | Freq: Four times a day (QID) | INTRAMUSCULAR | Status: DC | PRN
  Filled 2019-10-22: qty 2

## 2019-10-22 MED ORDER — BUPIVACAINE HCL (PF) 0.5 % IJ SOLN
INTRAMUSCULAR | Status: AC
  Filled 2019-10-22: qty 30

## 2019-10-22 MED ORDER — SODIUM CHLORIDE 0.9 % IV SOLN: INTRAVENOUS | Status: DC

## 2019-10-22 MED ORDER — ONDANSETRON HCL 4 MG/2ML IJ SOLN: 4.0000 mg | Freq: Four times a day (QID) | INTRAMUSCULAR | Status: DC | PRN

## 2019-10-22 MED ORDER — LATANOPROST 0.005 % OP SOLN
1.0000 [drp] | Freq: Every day | OPHTHALMIC | Status: DC
  Administered 2019-10-22 – 2019-10-27 (×4): 1 [drp] via OPHTHALMIC
  Filled 2019-10-22: qty 2.5

## 2019-10-22 MED ORDER — DOCUSATE SODIUM 100 MG PO CAPS
100.0000 mg | ORAL_CAPSULE | Freq: Two times a day (BID) | ORAL | Status: DC
  Administered 2019-10-22 – 2019-10-28 (×12): 100 mg via ORAL
  Filled 2019-10-22 (×11): qty 1

## 2019-10-22 MED ORDER — SENNOSIDES-DOCUSATE SODIUM 8.6-50 MG PO TABS: 1.0000 | ORAL_TABLET | Freq: Every evening | ORAL | Status: DC | PRN

## 2019-10-22 MED ORDER — FENTANYL CITRATE (PF) 100 MCG/2ML IJ SOLN: 25.0000 ug | INTRAMUSCULAR | Status: DC | PRN

## 2019-10-22 MED ORDER — ENOXAPARIN SODIUM 40 MG/0.4ML ~~LOC~~ SOLN
40.0000 mg | SUBCUTANEOUS | Status: DC
  Administered 2019-10-23 – 2019-10-28 (×6): 40 mg via SUBCUTANEOUS
  Filled 2019-10-22 (×7): qty 0.4

## 2019-10-22 MED ORDER — BUPIVACAINE LIPOSOME 1.3 % IJ SUSP
INTRAMUSCULAR | Status: AC
  Filled 2019-10-22: qty 20

## 2019-10-22 MED ORDER — FENTANYL CITRATE (PF) 100 MCG/2ML IJ SOLN
INTRAMUSCULAR | Status: DC | PRN
  Administered 2019-10-22 (×3): 25 ug via INTRAVENOUS

## 2019-10-22 MED ORDER — SODIUM CHLORIDE 0.9 % IV SOLN
INTRAVENOUS | Status: DC | PRN
  Administered 2019-10-22 (×3): 50 ug via INTRAVENOUS

## 2019-10-22 MED ORDER — ENSURE MAX PROTEIN PO LIQD
11.0000 [oz_av] | Freq: Two times a day (BID) | ORAL | Status: DC
  Administered 2019-10-23 – 2019-10-27 (×4): 11 [oz_av] via ORAL
  Filled 2019-10-22: qty 330

## 2019-10-22 MED ORDER — CALCIUM CARBONATE-VITAMIN D 500-200 MG-UNIT PO TABS
1.0000 | ORAL_TABLET | Freq: Every day | ORAL | Status: DC
  Administered 2019-10-24 – 2019-10-28 (×4): 1 via ORAL
  Filled 2019-10-22 (×5): qty 1

## 2019-10-22 MED ORDER — FLEET ENEMA 7-19 GM/118ML RE ENEM: 1.0000 | ENEMA | Freq: Once | RECTAL | Status: DC | PRN

## 2019-10-22 MED ORDER — ADULT MULTIVITAMIN W/MINERALS CH
1.0000 | ORAL_TABLET | Freq: Every day | ORAL | Status: DC
  Administered 2019-10-23 – 2019-10-28 (×5): 1 via ORAL
  Filled 2019-10-22 (×5): qty 1

## 2019-10-22 MED ORDER — HYDROMORPHONE HCL 1 MG/ML IJ SOLN
0.2500 mg | INTRAMUSCULAR | Status: DC | PRN
  Administered 2019-10-22 – 2019-10-23 (×2): 0.5 mg via INTRAVENOUS
  Filled 2019-10-22 (×2): qty 1

## 2019-10-22 MED ORDER — TRAMADOL HCL 50 MG PO TABS
50.0000 mg | ORAL_TABLET | Freq: Four times a day (QID) | ORAL | Status: DC | PRN
  Administered 2019-10-22 – 2019-10-26 (×2): 50 mg via ORAL
  Filled 2019-10-22 (×3): qty 1

## 2019-10-22 MED ORDER — CEFAZOLIN SODIUM-DEXTROSE 2-4 GM/100ML-% IV SOLN
2.0000 g | Freq: Four times a day (QID) | INTRAVENOUS | Status: AC
  Administered 2019-10-22 – 2019-10-23 (×3): 2 g via INTRAVENOUS
  Filled 2019-10-22 (×4): qty 100

## 2019-10-22 MED ORDER — EPHEDRINE SULFATE 50 MG/ML IJ SOLN
INTRAMUSCULAR | Status: DC | PRN
  Administered 2019-10-22: 10 mg via INTRAVENOUS

## 2019-10-22 MED ORDER — PROPOFOL 500 MG/50ML IV EMUL
INTRAVENOUS | Status: AC
  Filled 2019-10-22: qty 50

## 2019-10-22 MED ORDER — OXYCODONE HCL 5 MG PO TABS
5.0000 mg | ORAL_TABLET | ORAL | Status: DC | PRN
  Administered 2019-10-23: 01:00:00 10 mg via ORAL
  Filled 2019-10-22: qty 2

## 2019-10-22 MED ORDER — BISACODYL 10 MG RE SUPP
10.0000 mg | Freq: Every day | RECTAL | Status: DC | PRN
  Administered 2019-10-24 – 2019-10-26 (×2): 10 mg via RECTAL
  Filled 2019-10-22 (×2): qty 1

## 2019-10-22 MED ORDER — FENTANYL CITRATE (PF) 100 MCG/2ML IJ SOLN
INTRAMUSCULAR | Status: AC
  Filled 2019-10-22: qty 2

## 2019-10-22 SURGICAL SUPPLY — 54 items
"PENCIL ELECTRO HAND CTR " (MISCELLANEOUS) ×1 IMPLANT
BIT DRILL AO GAMMA 4.2X340 (BIT) ×2 IMPLANT
BLADE SURG 15 STRL LF DISP TIS (BLADE) ×1 IMPLANT
BLADE SURG 15 STRL SS (BLADE) ×2
CANISTER SUCT 1200ML W/VALVE (MISCELLANEOUS) ×3 IMPLANT
CHLORAPREP W/TINT 26 (MISCELLANEOUS) ×3 IMPLANT
COVER WAND RF STERILE (DRAPES) ×3 IMPLANT
DRAPE 3/4 80X56 (DRAPES) ×3 IMPLANT
DRAPE SURG 17X11 SM STRL (DRAPES) ×6 IMPLANT
DRAPE U-SHAPE 47X51 STRL (DRAPES) ×6 IMPLANT
DRSG MEPILEX SACRM 8.7X9.8 (GAUZE/BANDAGES/DRESSINGS) ×2 IMPLANT
DRSG OPSITE POSTOP 3X4 (GAUZE/BANDAGES/DRESSINGS) ×3 IMPLANT
DRSG OPSITE POSTOP 4X6 (GAUZE/BANDAGES/DRESSINGS) ×2 IMPLANT
DRSG OPSITE POSTOP 4X8 (GAUZE/BANDAGES/DRESSINGS) ×2 IMPLANT
ELECT REM PT RETURN 9FT ADLT (ELECTROSURGICAL) ×3
ELECTRODE REM PT RTRN 9FT ADLT (ELECTROSURGICAL) ×1 IMPLANT
GAUZE XEROFORM 1X8 LF (GAUZE/BANDAGES/DRESSINGS) ×2 IMPLANT
GLOVE BIO SURGEON STRL SZ7.5 (GLOVE) ×2 IMPLANT
GLOVE BIOGEL PI IND STRL 8 (GLOVE) ×1 IMPLANT
GLOVE BIOGEL PI INDICATOR 8 (GLOVE) ×2
GLOVE SURG SYN 7.5  E (GLOVE) ×2
GLOVE SURG SYN 7.5 E (GLOVE) ×1 IMPLANT
GLOVE SURG SYN 7.5 PF PI (GLOVE) ×1 IMPLANT
GOWN STRL REUS W/ TWL LRG LVL3 (GOWN DISPOSABLE) ×1 IMPLANT
GOWN STRL REUS W/ TWL XL LVL3 (GOWN DISPOSABLE) ×1 IMPLANT
GOWN STRL REUS W/TWL LRG LVL3 (GOWN DISPOSABLE) ×2
GOWN STRL REUS W/TWL XL LVL3 (GOWN DISPOSABLE) ×2
GUIDEROD T2 3X1000 (ROD) ×2 IMPLANT
K-WIRE  3.2X450M STR (WIRE) ×2
K-WIRE 3.2X450M STR (WIRE) ×1
KIT PATIENT CARE HANA TABLE (KITS) ×3 IMPLANT
KIT TURNOVER KIT A (KITS) ×3 IMPLANT
KWIRE 3.2X450M STR (WIRE) IMPLANT
MAT ABSORB  FLUID 56X50 GRAY (MISCELLANEOUS) ×4
MAT ABSORB FLUID 56X50 GRAY (MISCELLANEOUS) ×2 IMPLANT
NAIL TROCH GAMMA 11X18 (Nail) ×2 IMPLANT
NDL FILTER BLUNT 18X1 1/2 (NEEDLE) ×1 IMPLANT
NEEDLE FILTER BLUNT 18X 1/2SAF (NEEDLE) ×2
NEEDLE FILTER BLUNT 18X1 1/2 (NEEDLE) ×1 IMPLANT
NEEDLE HYPO 22GX1.5 SAFETY (NEEDLE) ×3 IMPLANT
NS IRRIG 1000ML POUR BTL (IV SOLUTION) ×3 IMPLANT
PACK HIP COMPR (MISCELLANEOUS) ×3 IMPLANT
PENCIL ELECTRO HAND CTR (MISCELLANEOUS) ×3 IMPLANT
REAMER SHAFT BIXCUT (INSTRUMENTS) ×2 IMPLANT
SCREW LAG GAMMA 3 110MM (Screw) ×2 IMPLANT
SCREW LOCKING T2 F/T  5MMX40MM (Screw) ×2 IMPLANT
SCREW LOCKING T2 F/T 5MMX40MM (Screw) IMPLANT
STAPLER SKIN PROX 35W (STAPLE) ×3 IMPLANT
SUT VIC AB 0 CT1 36 (SUTURE) ×2 IMPLANT
SUT VIC AB 2-0 CT2 27 (SUTURE) ×3 IMPLANT
SUT VICRYL 0 AB UR-6 (SUTURE) ×2 IMPLANT
SYR 10ML LL (SYRINGE) ×3 IMPLANT
SYR 30ML LL (SYRINGE) ×3 IMPLANT
TAPE CLOTH 3X10 WHT NS LF (GAUZE/BANDAGES/DRESSINGS) ×4 IMPLANT

## 2019-10-22 NOTE — Progress Notes (Signed)
Initial Nutrition Assessment  RD working remotely.  DOCUMENTATION CODES:   Not applicable  INTERVENTION:  Provide Ensure Max Protein po BID, each supplement provides 150 kcal and 30 grams of protein.  Provide daily MVI.  NUTRITION DIAGNOSIS:   Increased nutrient needs related to post-op healing as evidenced by estimated needs.  GOAL:   Patient will meet greater than or equal to 90% of their needs  MONITOR:   PO intake, Supplement acceptance, Diet advancement, Labs, Weight trends, Skin, I & O's  REASON FOR ASSESSMENT:   Consult Assessment of nutrition requirement/status, Hip fracture protocol  ASSESSMENT:   86 year old male with PMHx of CAD, HLD, DM, GERD, iron deficiency anemia, osteoporosis, memory loss admitted after a fall at the gym found to have right intertrochanteric hip fracture.   Patient is unable to provide any history over the phone at this time as he is down in OR. He has been NPO for operation but was previously on a carbohydrate modified diet. Only meal completion documented in chart is from this morning so that was likely an error or a family member's tray as patient was NPO.  Weights in chart appear fairly stable. He is currently 77.1 kg (169.97 lbs).  Medications reviewed and include: Novolog 0-15 units TID, Novolog 0-5 units QHS, Levemir 10 units QHS, pantoprazole, sertraline, cefazolin.  Labs reviewed: CBG 192-236, Sodium 133. HgbA1c 9 on 2/16.  Unable to determine if patient meets criteria for malnutrition at this time.  NUTRITION - FOCUSED PHYSICAL EXAM:  Unable to complete at this time as RD working remotely.  Diet Order:   Diet Order            Diet NPO time specified  Diet effective now             EDUCATION NEEDS:   Not appropriate for education at this time  Skin:  Skin Assessment: Reviewed RN Assessment(RN skin assessment not yet completed)  Last BM:  Unknown  Height:   Ht Readings from Last 1 Encounters:  10/22/19 6'  (1.829 m)   Weight:   Wt Readings from Last 1 Encounters:  10/22/19 77.1 kg   Ideal Body Weight:  80.9 kg  BMI:  Body mass index is 23.05 kg/m.  Estimated Nutritional Needs:   Kcal:  1900-2100  Protein:  95-105 grams  Fluid:  1.9-2.1 L/day  Felix Pacini, MS, RD, LDN Pager number available on Amion

## 2019-10-22 NOTE — Progress Notes (Signed)
Attempted IV stick in RAC Unsuccessful. MeishaR,LPN

## 2019-10-22 NOTE — Op Note (Signed)
DATE OF SURGERY: 10/22/2019  PREOPERATIVE DIAGNOSIS: Right intertrochanteric hip fracture  POSTOPERATIVE DIAGNOSIS: Right intertrochanteric hip fracture  PROCEDURE: Intramedullary nailing of right femur with cephalomedullary device  SURGEON: Rosealee Albee, MD  ASSISTANTS: none  EBL: 100 cc  COMPONENTS:  [employer] Short Gamma Nail: 11x164mm; lag screw; 58mm distal interlocking screw   INDICATIONS: [name] [name]. is a 86 y.o. male who sustained an intertrochanteric fracture after a fall. Risks and benefits of intramedullary nailing were explained to the patient. Risks include but are not limited to bleeding, infection, injury to tissues, nerves, vessels, periprosthetic infection, DVT/PE, malunion/nonunion, and risks of anesthesia. The patient understands these risks, has completed an informed consent, and wishes to proceed.   PROCEDURE:  The patient was brought into the operating room. After administering anesthesia, the patient was placed in the supine position on the Hana table. The uninjured leg was extended while the injured lower extremity was placed in longitudinal traction. The fracture was reduced using longitudinal traction and internal rotation. The adequacy of reduction was verified fluoroscopically in AP and lateral projections and found to be near anatomic. The lateral aspects of the operative hip and thigh were prepped with ChloraPrep solution before being draped sterilely. Preoperative IV antibiotics were administered. A timeout was performed to verify the appropriate surgical site, patient, and procedure.   The greater trochanter was identified and an approximately 6cm incision was made about 3 fingerbreadths above the tip of the greater trochanter. The incision was carried down through the subcutaneous tissues to expose the gluteal fascia. This was split the length of the incision, providing access to the tip of the trochanter. Under fluoroscopic guidance, a guidewire  was drilled through the tip of the trochanter into the proximal metaphysis to the level of the lesser trochanter. After verifying its position fluoroscopically in AP and lateral projections, it was overreamed with the opening reamer to the level of the lesser trochanter. A guidewire was passed down through the femoral canal. The adequacy of guidewire position was verified fluoroscopically in AP and lateral projections. The [employer] Short Gamma Nail was advanced to the appropriate depth as verified fluoroscopically.    The guide system for the lag screw was positioned and advanced through an approximately 4 cm stab incision over the lateral aspect of the proximal femur. The guidewire was drilled up through the femoral nail and into the femoral neck to rest within 5 mm of subchondral bone. After verifying its position in the femoral neck and head in both AP and lateral projections, the guidewire was measured and was overreamed to the appropriate depth before the lag screw was inserted and advanced to the appropriate depth as verified fluoroscopically in AP and lateral projections. The set screw was tightened and then untightened a quarter turn to allow for compression. Again, the adequacy of hardware position and fracture reduction was verified fluoroscopically in AP and lateral projections and found to be excellent.   Attention was directed distally and a 3cm incision was made for the distal interlocking screw. The aiming arm was used to drill and an appropriately sized screw was selected and advanced. There was excellent purchase of the screw. Adequacy of screw position was verified fluoroscopically in AP and lateral projections and found to be excellent.   The wounds were irrigated thoroughly with sterile saline solution. Local anesthetic was injected into the wounds.The gluteal fascia was closed using 0-Vicryl suture. The subcutaneous tissues were closed using 2-0 Vicryl interrupted sutures. The skin was  closed  using staples. Sterile occlusive dressings were applied to all wounds. The patient was then transferred to the recovery room in satisfactory condition after tolerating the procedure well.  POSTOPERATIVE PLAN: The patient will be WBAT on the operative extremity. Lovenox 40mg /day x 4 weeks to start on POD#1. Ancef x 24 hours. PT/OT on POD#1.

## 2019-10-22 NOTE — Anesthesia Preprocedure Evaluation (Signed)
Anesthesia Evaluation  Patient identified by MRN, date of birth, ID band Patient awake    Reviewed: Allergy & Precautions, H&P , NPO status , Patient's Chart, lab work & pertinent test results, reviewed documented beta blocker date and time   History of Anesthesia Complications Negative for: history of anesthetic complications  Airway Mallampati: II  TM Distance: >3 FB Neck ROM: full    Dental  (+) Dental Advidsory Given, Caps, Missing, Teeth Intact   Pulmonary neg pulmonary ROS,    Pulmonary exam normal        Cardiovascular Exercise Tolerance: Good negative cardio ROS Normal cardiovascular exam     Neuro/Psych negative neurological ROS  negative psych ROS   GI/Hepatic Neg liver ROS, GERD  ,  Endo/Other  negative endocrine ROSdiabetes  Renal/GU negative Renal ROS  negative genitourinary   Musculoskeletal   Abdominal   Peds  Hematology negative hematology ROS (+)   Anesthesia Other Findings Past Medical History: No date: CAD (coronary artery disease)     Comment:  non-obstructive by cath in 2000 - Myoview in 2008               negative. EF 57% - ETT in 8/10. Walked 9:17 on [place] w no              CPor ST-T changes No date: CAD (coronary artery disease) No date: Diabetes mellitus No date: GERD (gastroesophageal reflux disease) No date: Glaucoma No date: Hyperlipidemia No date: Iron deficiency anemia No date: Memory loss No date: Osteoporosis   Reproductive/Obstetrics negative OB ROS                             Anesthesia Physical Anesthesia Plan  ASA: II  Anesthesia Plan: Spinal   Post-op Pain Management:    Induction:   PONV Risk Score and Plan: Propofol infusion and TIVA  Airway Management Planned: Simple Face Mask  Additional Equipment:   Intra-op Plan:   Post-operative Plan:   Informed Consent: I have reviewed the patients History and Physical, chart, labs and  discussed the procedure including the risks, benefits and alternatives for the proposed anesthesia with the patient or authorized representative who has indicated his/her understanding and acceptance.     Dental Advisory Given  Plan Discussed with: Anesthesiologist, CRNA and Surgeon  Anesthesia Plan Comments:         Anesthesia Quick Evaluation

## 2019-10-22 NOTE — Anesthesia Procedure Notes (Addendum)
Spinal  Patient location during procedure: OR Start time: 10/22/2019 1:17 PM End time: 10/22/2019 2:01 PM Staffing Performed: resident/CRNA and anesthesiologist  Anesthesiologist: Martha Clan, MD Resident/CRNA: Gentry Fitz, CRNA Preanesthetic Checklist Completed: patient identified, IV checked, site marked, risks and benefits discussed, surgical consent, monitors and equipment checked, pre-op evaluation and timeout performed Spinal Block Patient position: sitting Prep: DuraPrep Patient monitoring: heart rate, cardiac monitor, continuous pulse ox and blood pressure Approach: midline Location: L3-4 Injection technique: single-shot Needle Needle type: Whitacre  Needle gauge: 22 G Needle length: 9 cm Assessment Sensory level: T10 Additional Notes IV functioning, monitors applied to pt. Expiration date of kit checked and confirmed to be in date. Sterile prep and drape, hand hygiene and sterile gloved used. Pt was positioned and spine was prepped in sterile fashion. Skin was anesthetized with lidocaine. Free flow of clear CSF obtained prior to injecting local anesthetic into CSF x 1 attempt. Spinal needle aspirated freely following injection. Needle was carefully withdrawn, and pt tolerated procedure well. Loss of motor and sensory on exam post injection.

## 2019-10-22 NOTE — Consult Note (Addendum)
ORTHOPAEDIC CONSULTATION  REQUESTING PHYSICIAN: Darlin Priestly, MD  Chief Complaint:   R hip pain  History of Present Illness: [name] [name]. is a 86 y.o. male who had a fall while at the gym when an equipment handle fell off causing the patient to fall onto his right hip.  The patient noted immediate hip pain and inability to ambulate.  The patient ambulates unassisted at baseline and is quite active with walking and exercising.  Pain is described as sharp at its worst and a dull ache at its best.  Pain is rated a 10 out of 10 in severity.  Pain is improved with rest and immobilization.  Pain is worse with any sort of movement.  X-rays in the emergency department show a right intertrochanteric hip fracture.  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  He is also hard of hearing.  Additionally, he also has osteoporosis.  Past Medical History:  Diagnosis Date  . CAD (coronary artery disease)    non-obstructive by cath in 2000 - Myoview in 2008 negative. EF 57% - ETT in 8/10. Walked 9:17 on [place] w no CPor ST-T changes  . CAD (coronary artery disease)   . Diabetes mellitus   . GERD (gastroesophageal reflux disease)   . Glaucoma   . Hyperlipidemia   . Iron deficiency anemia   . Memory loss   . Osteoporosis    Past Surgical History:  Procedure Laterality Date  . ANTERIOR CRUCIATE LIGAMENT REPAIR    . Cath (other)    . colon polyps removed     x2  . removal r eye     Social History   Socioeconomic History  . Marital status: Married    Spouse name: Not on file  . Number of children: Not on file  . Years of education: Not on file  . Highest education level: Not on file  Occupational History  . Not on file  Tobacco Use  . Smoking status: Never Smoker  . Smokeless tobacco: Never Used  Substance and Sexual Activity  . Alcohol use: Yes    Alcohol/week:  0.0 - 1.0 standard drinks  . Drug use: No  . Sexual activity: Not on file  Other Topics Concern  . Not on file  Social History Narrative   Retired. Married.    Social Determinants of Health   Financial Resource Strain:   . Difficulty of Paying Living Expenses: Not on file  Food Insecurity:   . Worried About Programme researcher, broadcasting/film/video in the Last Year: Not on file  . Ran Out of Food in the Last Year: Not on file  Transportation Needs:   . Lack of Transportation (Medical): Not on file  . Lack of Transportation (Non-Medical): Not on file  Physical Activity:   . Days of Exercise per Week: Not on file  . Minutes of Exercise per Session: Not on file  Stress:   . Feeling of Stress : Not on file  Social Connections:   . Frequency of Communication with Friends and Family: Not on file  . Frequency of Social Gatherings with Friends and Family: Not on file  . Attends Religious Services: Not on file  . Active Member of Clubs or Organizations: Not on file  . Attends Banker Meetings: Not on file  . Marital Status: Not on file   Family History  Problem Relation Age of Onset  . Stroke Mother   .  Heart attack Father   . Arthritis Sister    Allergies  Allergen Reactions  . Hydrocodone-Acetaminophen Rash   Prior to Admission medications   Medication Sig Start Date End Date Taking? Authorizing Provider  alendronate (FOSAMAX) 70 MG tablet Take 70 mg by mouth once a week. Saturday 02/18/19  Yes [provider]  aspirin 81 MG tablet Take 81 mg by mouth daily.     Yes [provider]  Calcium 600-200 MG-UNIT tablet Take 1 tablet by mouth daily at 6 (six) AM.   Yes [provider]  EVENING PRIMROSE OIL PO Take 1 capsule by mouth daily.   Yes [provider]  galantamine (RAZADYNE) 12 MG tablet Take 12 mg by mouth 2 (two) times daily.    Yes [provider]  glimepiride (AMARYL) 2 MG tablet Take 2 mg by mouth 2 (two) times daily.    Yes  [provider]  Glucosamine-Chondroitin 500-400 MG CAPS Take by mouth daily. Take 3 tabs    Yes [provider]  Insulin Detemir (LEVEMIR FLEXPEN) 100 UNIT/ML Pen Inject 32 Units into the skin at bedtime.    Yes [provider]  latanoprost (XALATAN) 0.005 % ophthalmic solution Place 1 drop into the left eye at bedtime.   Yes [provider]  magnesium oxide (MAG-OX) 400 MG tablet Take 400 mg by mouth daily.   Yes [provider]  metFORMIN (GLUCOPHAGE-XR) 500 MG 24 hr tablet Take 1,000 mg by mouth at bedtime. 04/30/19  Yes [provider]  Multiple Vitamin (MULTIVITAMIN) tablet Take 1 tablet by mouth daily.   Yes [provider]  Omega-3 Fatty Acids (FISH OIL PO) Take 1 capsule by mouth daily.   Yes [provider]  omeprazole (PRILOSEC) 20 MG capsule Take 20 mg by mouth daily.     Yes [provider]  pramipexole (MIRAPEX) 0.125 MG tablet Take 0.125 mg by mouth at bedtime. 09/23/19  Yes [provider]  sertraline (ZOLOFT) 50 MG tablet Take 50 mg by mouth daily.   Yes [provider]  simvastatin (ZOCOR) 40 MG tablet Take 40 mg by mouth at bedtime.     Yes [provider]  NEOMYCIN-POLYMYXIN-HYDROCORTISONE (CORTISPORIN) 1 % SOLN OTIC solution Place 5 drops into both ears daily as needed. Ear wax build up 06/20/19   [provider]  NITROSTAT 0.4 MG SL tablet TAKE ONE TABLET UNDER THE TONGUE EVERY 5 MINUTES FOR CHEST PAIN. IF NO RELIEF PAST 3RD TAB GO TO EMERGENCY ROOM 03/10/16   Antonieta Iba, MD   Recent Labs    10/21/19 1733 10/21/19 1735 10/22/19 0510  WBC 12.1*  --  12.6*  HGB 14.8  --  14.5  HCT 42.6  --  41.6  PLT 178  --  186  K 4.0  --  4.0  CL 103  --  99  CO2 27  --  26  BUN 17  --  17  CREATININE 0.74  --  0.67  GLUCOSE 122*  --  208*  CALCIUM 9.6  --  9.2  INR  --  1.0 1.1   Component     Latest Ref Rng & Units 10/21/2019  Hemoglobin A1C     4.8 - 5.6  % 9.0 (H)    DG Chest 1 View  Result Date: 10/21/2019 CLINICAL DATA:  Fall. EXAM: CHEST  1 VIEW COMPARISON:  06/07/2017 FINDINGS: Cardiac enlargement without heart failure. Lungs are clear and well aerated. No infiltrate  or effusion. Chronic right rib fractures. IMPRESSION: No active disease. Electronically Signed   By: Franchot Gallo M.D.   On: 10/21/2019 17:05   DG Hip Unilat W or Wo Pelvis 2-3 Views Right  Result Date: 10/21/2019 CLINICAL DATA:  Fall. EXAM: DG HIP (WITH OR WITHOUT PELVIS) 2-3V RIGHT COMPARISON:  None. FINDINGS: Intertrochanteric fracture right hip with mild angulation. Right hip joint normal. Diffuse arterial calcification. Degenerative change and spurring in the lumbar spine. IMPRESSION: Intertrochanteric fracture right femur. Electronically Signed   By: Franchot Gallo M.D.   On: 10/21/2019 17:04     Positive ROS: All other systems have been reviewed and were otherwise negative with the exception of those mentioned in the HPI and as above.  Physical Exam: BP (!) 161/78 (BP Location: Right Arm)   Pulse 86   Temp 98.5 F (36.9 C) (Oral)   Resp 17   Ht 6' (1.829 m)   Wt 77.1 kg   SpO2 93%   BMI 23.06 kg/m  General:  Alert, no acute distress Psychiatric:  Patient is competent for consent with normal mood and affect   Cardiovascular:  No pedal edema, regular rate and rhythm Respiratory:  No wheezing, non-labored breathing GI:  Abdomen is soft and non-tender Skin:  No lesions in the area of chief complaint, no erythema Neurologic:  Sensation intact distally, CN grossly intact Lymphatic:  No axillary or cervical lymphadenopathy  Orthopedic Exam:  RLE: 5/5 DF/PF/EHL SILT s/s/t/sp/dp distr Foot wwp +Log roll/axial load   X-rays:  As above: R intertrochanteric hip fracture  Assessment/Plan: [name]. is a 86 y.o. male with a R intertrochanteric hip fracture   1. I discussed the various treatment options including both surgical and non-surgical  management of her fracture with the patient and his wife. We discussed the high risk of perioperative complications due to patient's age and other co-morbidities. After discussion of risks, benefits, and alternatives to surgery, the family and patient were in agreement to proceed with surgery. The goals of surgery would be to provide adequate pain relief and allow for mobilization. Plan for surgery is R hip cephalomedullary nailing on 10/22/2019.  Anticipate around 12:30 PM 2. NPO until OR 3. Hold anticoagulation in advance of OR   Leim Fabry   10/22/2019 8:03 AM

## 2019-10-22 NOTE — TOC Initial Note (Signed)
Transition of Care (TOC) - Initial/Assessment Note    Patient Details  Name: [name] [name]. MRN: [id number] Date of Birth: July 07, 1934  Transition of [facility]) CM/SW Contact:    Elease Hashimoto, LCSW Phone Number: 10/22/2019, 10:42 AM  Clinical Narrative: Met with pt and wife who was at his bedside. Pt is going to surgery today. Discussed possible discharge needs. Both are hopeful he can return home with wife and home health therapies. Wife does use a rollator rolling walker herself so pt will need to be mod/i with walker at discharge for wife to be able to manage him at home. They do have two grown children who are local and involved along with a son in [place].  They have many pieces of equipment-adjustable bed, stair lift and lift chair that will assist pt. Did discuss possible need for short term rehab at [facility]. Both hope he will not need this due to all of the COVID in the facilities. Pt was very active prior to admission and was at the gym when he fell off of the exercise bike. Will continue to follow and work on safest discharge plan for pt.             Expected Discharge Plan: [place] Barriers to Discharge: Continued Medical Work up   Patient Goals and CMS Choice Patient states their goals for this hospitalization and ongoing recovery are:: I hope I can go home but will depend upon how well I am moving after surgery      Expected Discharge Plan and Services Expected Discharge Plan: [place] In-house Referral: Clinical Social Work     Living arrangements for the past 2 months: Single Family Home                                      Prior Living Arrangements/Services Living arrangements for the past 2 months: Single Family Home Lives with:: Spouse          Need for Family Participation in Patient Care: Yes (Comment) Care giver support system in place?: Yes (comment) Current home services: DME(has rollator rolling walker,  cane, lift chair, stair lift and adjustable bed)    Activities of Daily Living Home Assistive Devices/Equipment: None ADL Screening (condition at time of admission) Patient's cognitive ability adequate to safely complete daily activities?: Yes Is the patient deaf or have difficulty hearing?: Yes Does the patient have difficulty seeing, even when wearing glasses/contacts?: No Does the patient have difficulty concentrating, remembering, or making decisions?: No Patient able to express need for assistance with ADLs?: Yes Does the patient have difficulty dressing or bathing?: Yes Independently performs ADLs?: No Communication: Independent Dressing (OT): Needs assistance Is this a change from baseline?: Change from baseline, expected to last >3 days Grooming: Independent Feeding: Independent Bathing: Needs assistance Is this a change from baseline?: Change from baseline, expected to last >3 days Toileting: Independent In/Out Bed: Dependent Is this a change from baseline?: Change from baseline, expected to last >3 days Walks in Home: Independent Does the patient have difficulty walking or climbing stairs?: No Weakness of Legs: Right Weakness of Arms/Hands: None  Permission Sought/Granted                  Emotional Assessment Appearance:: Appears stated age Attitude/Demeanor/Rapport: Gracious Affect (typically observed): Calm, Accepting Orientation: : Oriented to Self, Oriented to Place, Oriented  to  Time, Oriented to Situation      Admission diagnosis:  Hip fracture ([place]) [S72.009A] Fall [W19.XXXA] Right hip pain [M25.551] Fall, initial encounter [W19.XXXA] Intertrochanteric fracture of right hip, closed, initial encounter [facility]) [S72.141A] Patient Active Problem List   Diagnosis Date Noted  . Hip fracture ([place]) 10/21/2019  . Weakness 06/07/2017  . PAD (peripheral artery disease) ([place]) 09/07/2014  . Diabetes mellitus ([place]) 09/21/2011  . Hyperlipidemia 03/25/2009  .  CAD, NATIVE VESSEL 03/25/2009  . CHEST PAIN-PRECORDIAL 03/25/2009   PCP:  Rusty Aus, MD Pharmacy:   [place], [place] - [address] [place] [address] Phone: [phone] Fax: [phone]     Social Determinants of Health (SDOH) Interventions    Readmission Risk Interventions No flowsheet data found.

## 2019-10-22 NOTE — Progress Notes (Signed)
PROGRESS NOTE    [name] [name].  LYY:[id number] DOB: [birth year] DOA: 10/21/2019 PCP: Rusty Aus, MD    Assessment & Plan:   Active Problems:   Hip fracture ([place])    [name] Ducre. is a 86 y.o. Caucasian male with medical history significant of CVA, CAD, GERD, glaucoma, hyperlipidemia, insulin-dependent diabetes who presents for evaluation after slip and fall while at the gym and a resultant intertrochanteric right hip fracture.  The patient denies any prodromal syncopal events.  Denies any loss of consciousness.  Denies any head trauma.   Intertrochanteric hip fracture, right S/p INTRAMEDULLARY (IM) NAIL INTERTROCHANTRIC with Dr. Posey Pronto No syncopal event, secondary to mechanical fall Plan: --pain control, per ortho --PT/OT tomorrow  Insulin-dependent diabetes Patient is on Tresiba flex touch 32 units at bedtime Also on Metformin 1000 mg daily Also on glimepiride Plan: Hold oral agents Lantus 10 units every afternoon Moderate sliding scale Carb modified diet when diet advanced  Coronary artery disease Continue daily aspirin Sublingual nitroglycerin  Hyperlipidemia Continue simvastatin  Depression Restless leg Sertraline 50 mg nightly Pramipexole 0.125 mg nightly  GERD Protonix 40 mg daily  History of CVA Daily ASA   DVT prophylaxis: Lovenox SQ Code Status: Full code  Family Communication: Wife updated at bedside Disposition Plan: HHPT vs SNF rehab   Subjective and Interval History:  Pt went for INTRAMEDULLARY (IM) NAIL INTERTROCHANTRIC on the right.  Tolerated it well.  Post-op, pt was alert, not feeling his LE yet.  No fever, dyspnea, chest pain, abdominal pain, N/V/D.   Objective: Vitals:   10/22/19 1649 10/22/19 1754 10/22/19 1856 10/22/19 1858  BP:  (!) 159/62 (!) 152/136 (!) 162/72  Pulse:  65 64 66  Resp:  16 18   Temp:   97.6 F (36.4 C)   TempSrc:   Oral   SpO2: 100% 97% 99%   Weight:      Height:        Intake/Output  Summary (Last 24 hours) at 10/22/2019 2039 Last data filed at 10/22/2019 1611 Gross per 24 hour  Intake 950 ml  Output 970 ml  Net -20 ml   Filed Weights   10/21/19 1603 10/21/19 2251 10/22/19 1237  Weight: 77.1 kg 77.1 kg 77.1 kg    Examination:   Constitutional: NAD, AAOx3 HEENT: conjunctivae and lids normal, EOMI CV: RRR no M,R,G. Distal pulses +2.  No cyanosis.   RESP: CTA B/L, normal respiratory effort  GI: +BS, NTND Extremities: No effusions, edema in BLE SKIN: warm, dry and intact Neuro: II - XII grossly intact.  Sensation intact Psych: Normal mood and affect.  Appropriate judgement and reason   Data Reviewed: I have personally reviewed following labs and imaging studies  CBC: Recent Labs  Lab 10/21/19 1733 10/22/19 0510  WBC 12.1* 12.6*  NEUTROABS  --  9.1*  HGB 14.8 14.5  HCT 42.6 41.6  MCV 88.0 87.4  PLT 178 127   Basic Metabolic Panel: Recent Labs  Lab 10/21/19 1733 10/22/19 0510  NA 136 133*  K 4.0 4.0  CL 103 99  CO2 27 26  GLUCOSE 122* 208*  BUN 17 17  CREATININE 0.74 0.67  CALCIUM 9.6 9.2   GFR: Estimated Creatinine Clearance: 76.3 mL/min (by C-G formula based on SCr of 0.67 mg/dL). Liver Function Tests: No results for input(s): AST, ALT, ALKPHOS, BILITOT, PROT, ALBUMIN in the last 168 hours. No results for input(s): LIPASE, AMYLASE in the last 168 hours. No results for input(s): AMMONIA  in the last 168 hours. Coagulation Profile: Recent Labs  Lab 10/21/19 1735 10/22/19 0510  INR 1.0 1.1   Cardiac Enzymes: No results for input(s): CKTOTAL, CKMB, CKMBINDEX, TROPONINI in the last 168 hours. BNP (last 3 results) No results for input(s): PROBNP in the last 8760 hours. HbA1C: Recent Labs    10/21/19 2324  HGBA1C 9.0*   CBG: Recent Labs  Lab 10/22/19 0848 10/22/19 1037 10/22/19 1136 10/22/19 1547 10/22/19 1648  GLUCAP 236* 196* 192* 138* 127*   Lipid Profile: No results for input(s): CHOL, HDL, LDLCALC, TRIG, CHOLHDL,  LDLDIRECT in the last 72 hours. Thyroid Function Tests: No results for input(s): TSH, T4TOTAL, FREET4, T3FREE, THYROIDAB in the last 72 hours. Anemia Panel: No results for input(s): VITAMINB12, FOLATE, FERRITIN, TIBC, IRON, RETICCTPCT in the last 72 hours. Sepsis Labs: No results for input(s): PROCALCITON, LATICACIDVEN in the last 168 hours.  Recent Results (from the past 240 hour(s))  Respiratory Panel by RT PCR (Flu A&B, Covid) - Nasopharyngeal Swab     Status: None   Collection Time: 10/21/19  5:35 PM   Specimen: Nasopharyngeal Swab  Result Value Ref Range Status   SARS Coronavirus 2 by RT PCR NEGATIVE NEGATIVE Final    Comment: (NOTE) SARS-CoV-2 target nucleic acids are NOT DETECTED. The SARS-CoV-2 RNA is generally detectable in upper respiratoy specimens during the acute phase of infection. The lowest concentration of SARS-CoV-2 viral copies this assay can detect is 131 copies/mL. A negative result does not preclude SARS-Cov-2 infection and should not be used as the sole basis for treatment or other patient management decisions. A negative result may occur with  improper specimen collection/handling, submission of specimen other than nasopharyngeal swab, presence of viral mutation(s) within the areas targeted by this assay, and inadequate number of viral copies (<131 copies/mL). A negative result must be combined with clinical observations, patient history, and epidemiological information. The expected result is Negative. Fact Sheet for Patients:  https://www.moore.com/ Fact Sheet for Healthcare Providers:  https://www.young.biz/ This test is not yet ap proved or cleared by the [place] FDA and  has been authorized for detection and/or diagnosis of SARS-CoV-2 by FDA under an Emergency Use Authorization (EUA). This EUA will remain  in effect (meaning this test can be used) for the duration of the COVID-19 declaration under Section  564(b)(1) of the Act, 21 U.S.C. section 360bbb-3(b)(1), unless the authorization is terminated or revoked sooner.    Influenza A by PCR NEGATIVE NEGATIVE Final   Influenza B by PCR NEGATIVE NEGATIVE Final    Comment: (NOTE) The Xpert Xpress SARS-CoV-2/FLU/RSV assay is intended as an aid in  the diagnosis of influenza from Nasopharyngeal swab specimens and  should not be used as a sole basis for treatment. Nasal washings and  aspirates are unacceptable for Xpert Xpress SARS-CoV-2/FLU/RSV  testing. Fact Sheet for Patients: https://www.moore.com/ Fact Sheet for Healthcare Providers: https://www.young.biz/ This test is not yet approved or cleared by the [place] FDA and  has been authorized for detection and/or diagnosis of SARS-CoV-2 by  FDA under an Emergency Use Authorization (EUA). This EUA will remain  in effect (meaning this test can be used) for the duration of the  Covid-19 declaration under Section 564(b)(1) of the Act, 21  U.S.C. section 360bbb-3(b)(1), unless the authorization is  terminated or revoked. Performed at [facility], [address] [place], [place] [address]   MRSA PCR Screening     Status: None   Collection Time: 10/22/19  4:53 AM  Specimen: Nasal Mucosa; Nasopharyngeal  Result Value Ref Range Status   MRSA by PCR NEGATIVE NEGATIVE Final    Comment:        The GeneXpert MRSA Assay (FDA approved for NASAL specimens only), is one component of a comprehensive MRSA colonization surveillance program. It is not intended to diagnose MRSA infection nor to guide or monitor treatment for MRSA infections. Performed at [facility], [address], [place], [place] [address]       Radiology Studies: DG Chest 1 View  Result Date: 10/21/2019 CLINICAL DATA:  Fall. EXAM: CHEST  1 VIEW COMPARISON:  06/07/2017 FINDINGS: Cardiac enlargement without heart failure. Lungs are clear and well aerated. No  infiltrate or effusion. Chronic right rib fractures. IMPRESSION: No active disease. Electronically Signed   By: Marlan Palau M.D.   On: 10/21/2019 17:05   DG HIP OPERATIVE UNILAT W OR W/O PELVIS RIGHT  Result Date: 10/22/2019 CLINICAL DATA:  Right hip surgery EXAM: OPERATIVE right HIP (WITH PELVIS IF PERFORMED) 6 VIEWS TECHNIQUE: Fluoroscopic spot image(s) were submitted for interpretation post-operatively. COMPARISON:  10/21/2019 FINDINGS: Six low resolution intraoperative spot views of the right hip. Total fluoroscopy time was 1 minutes 30 seconds. The images demonstrate intramedullary rod fixation of right intertrochanteric fracture with anatomic alignment IMPRESSION: Intraoperative fluoroscopic assistance provided during surgical fixation of right hip fracture Electronically Signed   By: Jasmine Pang M.D.   On: 10/22/2019 17:06   DG Hip Unilat W or Wo Pelvis 2-3 Views Right  Result Date: 10/21/2019 CLINICAL DATA:  Fall. EXAM: DG HIP (WITH OR WITHOUT PELVIS) 2-3V RIGHT COMPARISON:  None. FINDINGS: Intertrochanteric fracture right hip with mild angulation. Right hip joint normal. Diffuse arterial calcification. Degenerative change and spurring in the lumbar spine. IMPRESSION: Intertrochanteric fracture right femur. Electronically Signed   By: Marlan Palau M.D.   On: 10/21/2019 17:04     Scheduled Meds: . acetaminophen  1,000 mg Oral Q8H  . calcium-vitamin D  1 tablet Oral Q0600  . docusate sodium  100 mg Oral BID  . [START ON 10/23/2019] enoxaparin (LOVENOX) injection  40 mg Subcutaneous Q24H  . insulin aspart  0-15 Units Subcutaneous TID WC  . insulin aspart  0-5 Units Subcutaneous QHS  . insulin detemir  10 Units Subcutaneous QHS  . latanoprost  1 drop Left Eye QHS  . [START ON 10/23/2019] multivitamin with minerals  1 tablet Oral Daily  . pantoprazole  40 mg Oral Daily  . pramipexole  0.125 mg Oral QHS  . [START ON 10/23/2019] Ensure Max Protein  11 oz Oral BID BM  . sertraline  50 mg  Oral QHS  . simvastatin  40 mg Oral QHS   Continuous Infusions: . sodium chloride 75 mL/hr at 10/22/19 1833  .  ceFAZolin (ANCEF) IV    . methocarbamol (ROBAXIN) IV       LOS: 1 day     Darlin Priestly, MD [facility] If 7PM-7AM, please contact night-coverage 10/22/2019, 8:39 PM

## 2019-10-22 NOTE — Progress Notes (Signed)
Inpatient Diabetes Program Recommendations  AACE/ADA: New Consensus Statement on Inpatient Glycemic Control (2015)  Target Ranges:  Prepandial:   less than 140 mg/dL      Peak postprandial:   less than 180 mg/dL (1-2 hours)      Critically ill patients:  140 - 180 mg/dL   Lab Results  Component Value Date   GLUCAP 192 (H) 10/22/2019   HGBA1C 9.0 (H) 10/21/2019    Review of Glycemic Control Results for ALEPH, NICKSON (MRN [id number]) as of 10/22/2019 13:03  Ref. Range 10/21/2019 21:47 10/22/2019 08:48 10/22/2019 10:37 10/22/2019 11:36  Glucose-Capillary Latest Ref Range: 70 - 99 mg/dL 148 (H) 307 (H) 354 (H) 192 (H)   Diabetes history: DM  Outpatient Diabetes medications:  Amaryl 2 mg bid, Levemir 32 units q HS, Metformin 1000 mg bid Current orders for Inpatient glycemic control:  Novolog moderate tid with meals and HS Levemir 10 units q HS Inpatient Diabetes Program Recommendations:   Please consider increasing Levemir to 26 units q HS.  Thanks  Beryl Meager, RN, BC-ADM Inpatient Diabetes Coordinator Pager [phone] (8a-5p)

## 2019-10-22 NOTE — H&P (Signed)
Paper H&P to be scanned into permanent record. H&P reviewed. No significant changes noted.  

## 2019-10-22 NOTE — Transfer of Care (Signed)
Immediate Anesthesia Transfer of Care Note  Patient: [name] [name].  Procedure(s) Performed: INTRAMEDULLARY (IM) NAIL INTERTROCHANTRIC (Right Hip)  Patient Location: PACU  Anesthesia Type:Spinal  Level of Consciousness: awake, alert , oriented and patient cooperative  Airway & Oxygen Therapy: Patient Spontanous Breathing and Patient connected to face mask oxygen  Post-op Assessment: Report given to RN and Post -op Vital signs reviewed and stable  Post vital signs: stable  Last Vitals:  Vitals Value Taken Time  BP 119/58 10/22/19 1541  Temp    Pulse 85 10/22/19 1543  Resp 19 10/22/19 1543  SpO2 100 % 10/22/19 1543  Vitals shown include unvalidated device data.  Last Pain:  Vitals:   10/22/19 1237  TempSrc: Oral  PainSc: 4          Complications: No apparent anesthesia complications

## 2019-10-22 NOTE — Plan of Care (Signed)
Patient reports intermittent sharp pain in right hip. Adequate pain control progressing.  Patient's skin is free of any injuries. Maintenance of skin integrity pending.  Patient and spouse educated on plan of care.

## 2019-10-23 LAB — CBC
HCT: 36.4 % — ABNORMAL LOW (ref 39.0–52.0)
Hemoglobin: 12.5 g/dL — ABNORMAL LOW (ref 13.0–17.0)
MCH: 30.3 pg (ref 26.0–34.0)
MCHC: 34.3 g/dL (ref 30.0–36.0)
MCV: 88.3 fL (ref 80.0–100.0)
Platelets: 156 10*3/uL (ref 150–400)
RBC: 4.12 MIL/uL — ABNORMAL LOW (ref 4.22–5.81)
RDW: 12.9 % (ref 11.5–15.5)
WBC: 11.5 10*3/uL — ABNORMAL HIGH (ref 4.0–10.5)
nRBC: 0 % (ref 0.0–0.2)

## 2019-10-23 LAB — BASIC METABOLIC PANEL
Anion gap: 5 (ref 5–15)
BUN: 16 mg/dL (ref 8–23)
CO2: 25 mmol/L (ref 22–32)
Calcium: 8.6 mg/dL — ABNORMAL LOW (ref 8.9–10.3)
Chloride: 104 mmol/L (ref 98–111)
Creatinine, Ser: 0.67 mg/dL (ref 0.61–1.24)
GFR calc Af Amer: >60 mL/min (ref >60)
GFR calc non Af Amer: >60 mL/min (ref >60)
Glucose, Bld: 174 mg/dL — ABNORMAL HIGH (ref 70–99)
Potassium: 4.2 mmol/L (ref 3.5–5.1)
Sodium: 134 mmol/L — ABNORMAL LOW (ref 135–145)

## 2019-10-23 LAB — GLUCOSE, CAPILLARY
Glucose-Capillary: 194 mg/dL — ABNORMAL HIGH (ref 70–99)
Glucose-Capillary: 162 mg/dL — ABNORMAL HIGH (ref 70–99)
Glucose-Capillary: 130 mg/dL — ABNORMAL HIGH (ref 70–99)
Glucose-Capillary: 212 mg/dL — ABNORMAL HIGH (ref 70–99)
Glucose-Capillary: 250 mg/dL — ABNORMAL HIGH (ref 70–99)

## 2019-10-23 LAB — MAGNESIUM: Magnesium: 2.1 mg/dL (ref 1.7–2.4)

## 2019-10-23 MED ORDER — ASPIRIN EC 81 MG PO TBEC
81.0000 mg | DELAYED_RELEASE_TABLET | Freq: Every day | ORAL | Status: DC
  Administered 2019-10-23 – 2019-10-28 (×6): 81 mg via ORAL
  Filled 2019-10-23 (×6): qty 1

## 2019-10-23 MED ORDER — ARTIFICIAL TEARS OPHTHALMIC OINT
TOPICAL_OINTMENT | OPHTHALMIC | Status: DC | PRN
  Filled 2019-10-23: qty 3.5

## 2019-10-23 MED ORDER — TRAMADOL HCL 50 MG PO TABS: 50.0000 mg | ORAL_TABLET | Freq: Four times a day (QID) | ORAL | 0 refills | Status: AC | PRN

## 2019-10-23 MED ORDER — OXYCODONE HCL 5 MG PO TABS: 2.5000 mg | ORAL_TABLET | ORAL | 0 refills | Status: AC | PRN

## 2019-10-23 MED ORDER — ENOXAPARIN SODIUM 40 MG/0.4ML ~~LOC~~ SOLN: 40.0000 mg | SUBCUTANEOUS | 0 refills | Status: DC

## 2019-10-23 MED ORDER — SODIUM CHLORIDE 0.9 % IV SOLN: INTRAVENOUS | Status: DC | PRN

## 2019-10-23 NOTE — Evaluation (Signed)
Physical Therapy Evaluation Patient Details Name: [name] [name]. MRN: [id number] DOB: [birth year] Today's Date: 10/23/2019   History of Present Illness  [name] [name]. is a 86 y.o. male with medical history significant of CVA, CAD, GERD, glaucoma, hyperlipidemia, insulin-dependent diabetes who presented to the hospital by EMS 10/21/2019 after fall off equipment while at the gym and a resultant intertrochanteric right hip fracture for which he was admitted to the hospital. Underwent intramedullary nailing of R femur on 10/22/2019 and is now WBAT on that LE. Further medical history includes memory loss, osteoporosis, ACLR.    Clinical Impression  Patient very drowsy and able to give limited history and may not be reliable historian. States he lives with his wife in a home with 11 steps to enter. He describes a 2 story home without the ability to live on the main level. He reports at tub-shower combo with a shower seat. Prior to hospitalization he describes ambulating in the home and community with a SPC and independence with all ADLs, IADLs, and driving. Recommend confirming this history at a later session. Upon evaluation, patient was severely limited by pain and drowsiness. He agreed to attempt to sit at the edge of the bed but was unable to proceed after several attempts and encouragement due to pain at the R hip with movement of the R femur. Patient became more alert during activity but fell back asleep quickly upon returning to bed. Patient was incontinent of urine during bed mobility attempt and performed rolling max A +2 to change chuck. Nursing assisted with scooting patient up in the bed total A +2. SpO2 and HR WFL on 2L/min O2. Patient appears to have experienced a significant decline in functional mobility/independance and would benefit from short term rehab prior to returning home. Patient would benefit from skilled physical therapist to address impairments and functional limitations (see PT  Problem List) to work towards stated goals and return to PLOF or maximal functional independence.       Follow Up Recommendations SNF    Equipment Recommendations  Rolling walker with 5" wheels;3in1 (PT)    Recommendations for Other Services OT consult     Precautions / Restrictions Restrictions Weight Bearing Restrictions: Yes RLE Weight Bearing: Weight bearing as tolerated      Mobility  Bed Mobility Overal bed mobility: Needs Assistance Bed Mobility: Supine to Sit;Sit to Supine     Supine to sit: Max assist Sit to supine: Max assist;Total assist;+2 for physical assistance;HOB elevated   General bed mobility comments: Attempted supine <> sit towards left side. Patient was able to sit up about 50% of the way and move his feet off the edge of the bed (just his feet) with mod A, but was physically resistant to moving further over several attempts due to pain exeperienced when moving R hip joint. Patient returned to supine with mod A and scooted up bed with Total A x 2. Nursing staff assisting.  Transfers                 General transfer comment: unable at this time  Ambulation/Gait             General Gait Details: unable at this time  Stairs            Wheelchair Mobility    Modified Rankin (Stroke Patients Only)       Balance Overall balance assessment: History of Falls;Needs assistance   Sitting balance-Leahy Scale: Zero Sitting balance - Comments: patient  unable to come to sitting due to pain     Standing balance-Leahy Scale: Zero Standing balance comment: unable to safely attempt standing at this time                             Pertinent Vitals/Pain Pain Assessment: Faces Faces Pain Scale: Hurts whole lot Pain Location: Patient yells and tenses his body, resisting attempts to assist him supine to sit. Seems peaceful at rest. Pain in R hip. Pain Intervention(s): Limited activity within patient's tolerance;Patient  requesting pain meds-RN notified;Repositioned;Other (comment)(nursing unable to administer further pain medication due to possibility that it increased agitation last night)    Home Living Family/patient expects to be discharged to:: Private residence Living Arrangements: Spouse/significant other Available Help at Discharge: Family Type of Home: House Home Access: Stairs to enter Entrance Stairs-Rails: (patient states he doesn't think he has hand rails) Entrance Stairs-Number of Steps: [place]: Two level [place] - single point;Shower seat Additional Comments: Patient is very drowsy and slightly confused - may not be reliable historian. He was able to give limited information. Reccomend clarifying at future visits.    Prior Function Level of Independence: Independent         Comments: Patient states he was I with all ADLs, IADLs, and driving. States he ambulates with cane. Able to recall that he was at the [facility] when he fell prior to hospitalization.     Hand Dominance   Dominant Hand: Right    Extremity/Trunk Assessment   Upper Extremity Assessment Upper Extremity Assessment: Generalized weakness    Lower Extremity Assessment Lower Extremity Assessment: Generalized weakness;RLE deficits/detail RLE: Unable to fully assess due to pain    Cervical / Trunk Assessment Cervical / Trunk Assessment: Normal  Communication   Communication: Other (comment)(drowsy)  Cognition Arousal/Alertness: Lethargic Behavior During Therapy: (unable to tolerate pain, drowsy and has difficulty following commands) Overall Cognitive Status: No family/caregiver present to determine baseline cognitive functioning                                 General Comments: Patient very drowsy, more alert upon attempts to sit and agreed to attempt sitting, but was unable due to pain.      General Comments      Exercises Other Exercises Other Exercises: educated pt on  purpose of PT in acute care setting, importance of early mobilty Other Exercises: rolling with max A +2 to change soiled chuck   Assessment/Plan    PT Assessment Patient needs continued PT services  PT Problem List Decreased strength;Decreased mobility;Decreased safety awareness;Decreased range of motion;Decreased coordination;Decreased knowledge of precautions;Decreased activity tolerance;Decreased cognition;Cardiopulmonary status limiting activity;Decreased skin integrity;Decreased balance;Decreased knowledge of use of DME;Pain       PT Treatment Interventions DME instruction;Therapeutic activities;Cognitive remediation;Gait training;Therapeutic exercise;Patient/family education;Stair training;Balance training;Functional mobility training;Neuromuscular re-education    PT Goals (Current goals can be found in the Care Plan section)  Acute Rehab PT Goals PT Goal Formulation: Patient unable to participate in goal setting Time For Goal Achievement: 11/06/19    Frequency BID   Barriers to discharge Inaccessible home environment patient reports over 11 stairs at his home to get to living area    Co-evaluation               AM-PAC PT "6 Clicks" Mobility  Outcome Measure Help needed turning from your back to  your side while in a flat bed without using bedrails?: Total Help needed moving from lying on your back to sitting on the side of a flat bed without using bedrails?: Total Help needed moving to and from a bed to a chair (including a wheelchair)?: Total Help needed standing up from a chair using your arms (e.g., wheelchair or bedside chair)?: Total Help needed to walk in hospital room?: Total Help needed climbing 3-5 steps with a railing? : Total 6 Click Score: 6    End of Session Equipment Utilized During Treatment: Oxygen(2L/min) Activity Tolerance: Patient limited by pain;Patient limited by lethargy Patient left: in bed;with nursing/sitter in room Nurse Communication:  Mobility status;Patient requests pain meds PT Visit Diagnosis: Unsteadiness on feet (R26.81);History of falling (Z91.81);Difficulty in walking, not elsewhere classified (R26.2);Pain;Muscle weakness (generalized) (M62.81) Pain - Right/Left: Right Pain - part of body: Hip    Time: 0912-0943 PT Time Calculation (min) (ACUTE ONLY): 31 min   Charges:   PT Evaluation $PT Eval Moderate Complexity: 1 Mod PT Treatments $Therapeutic Activity: 8-22 mins        Luretha Murphy. Ilsa Iha, PT, DPT 10/23/19, 10:05 AM

## 2019-10-23 NOTE — Progress Notes (Signed)
  Subjective: 1 Day Post-Op Procedure(s) (LRB): INTRAMEDULLARY (IM) NAIL INTERTROCHANTRIC (Right) Patient reports pain as moderate.  Resting well with nurse at bedside.  Agitated during the PM. Patient is well, and has had no acute complaints or problems Plan is to go Skilled nursing facility after hospital stay. Negative for chest pain and shortness of breath Fever: no Gastrointestinal:negative for nausea and vomiting  Objective: Vital signs in last 24 hours: Temp:  [97.1 F (36.2 C)-99.6 F (37.6 C)] 98.3 F (36.8 C) (02/18 0824) Pulse Rate:  [64-101] 78 (02/18 0920) Resp:  [11-19] 17 (02/18 0824) BP: (118-168)/(56-136) 133/63 (02/18 0824) SpO2:  [90 %-100 %] 96 % (02/18 0920) FiO2 (%):  [21 %] 21 % (02/17 1649) Weight:  [77.1 kg] 77.1 kg (02/17 1237)  Intake/Output from previous day:  Intake/Output Summary (Last 24 hours) at 10/23/2019 1124 Last data filed at 10/23/2019 0900 Gross per 24 hour  Intake 1150 ml  Output 1200 ml  Net -50 ml    Intake/Output this shift: Total I/O In: 240 [P.O.:240] Out: -   Labs: Recent Labs    10/21/19 1733 10/22/19 0510 10/23/19 0628  HGB 14.8 14.5 12.5*   Recent Labs    10/22/19 0510 10/23/19 0628  WBC 12.6* 11.5*  RBC 4.76 4.12*  HCT 41.6 36.4*  PLT 186 156   Recent Labs    10/22/19 0510 10/23/19 0628  NA 133* 134*  K 4.0 4.2  CL 99 104  CO2 26 25  BUN 17 16  CREATININE 0.67 0.67  GLUCOSE 208* 174*  CALCIUM 9.2 8.6*   Recent Labs    10/21/19 1735 10/22/19 0510  INR 1.0 1.1     EXAM General - Patient is Confused Extremity - Neurovascular intact Compartment soft Dressing/Incision - clean, dry, no drainage Motor Function - intact, moving foot and toes well on exam.   Past Medical History:  Diagnosis Date  . CAD (coronary artery disease)    non-obstructive by cath in 2000 - Myoview in 2008 negative. EF 57% - ETT in 8/10. Walked 9:17 on [place] w no CPor ST-T changes  . CAD (coronary artery disease)   .  Diabetes mellitus   . GERD (gastroesophageal reflux disease)   . Glaucoma   . Hyperlipidemia   . Iron deficiency anemia   . Memory loss   . Osteoporosis     Assessment/Plan: 1 Day Post-Op Procedure(s) (LRB): INTRAMEDULLARY (IM) NAIL INTERTROCHANTRIC (Right) Active Problems:   Hip fracture ([facility])  Estimated body mass index is 23.05 kg/m as calculated from the following:   Height as of this encounter: 6' (1.829 m).   Weight as of this encounter: 77.1 kg. Advance diet Up with therapy D/C IV fluids Discharge to SNF when cleared by medicine. Follow up at [facility] in 2 weeiks for staple removal and X-rays.  DVT Prophylaxis - Lovenox, Foot Pumps and TED hose Weight-Bearing as tolerated to right leg  Dedra Skeens, PA-C Orthopaedic Surgery 10/23/2019, 11:24 AM

## 2019-10-23 NOTE — Progress Notes (Signed)
PROGRESS NOTE    [name] [name].  OXB:[id number] DOB: 1934-07-30 DOA: 10/21/2019 PCP: Danella Penton, MD    Assessment & Plan:   Active Problems:   Hip fracture (HCC)    [name] [name]. is a 86 y.o. Caucasian male with medical history significant of CVA, CAD, GERD, glaucoma, hyperlipidemia, insulin-dependent diabetes who presents for evaluation after slip and fall while at the gym and a resultant intertrochanteric right hip fracture.  The patient denies any prodromal syncopal events.  Denies any loss of consciousness.  Denies any head trauma.   Intertrochanteric hip fracture, right S/p INTRAMEDULLARY (IM) NAIL INTERTROCHANTRIC with Dr. Allena Katz No syncopal event, secondary to mechanical fall Plan: --pain control, per ortho --PT/OT  --WBAT on the operative extremity. Lovenox 40mg /day x 4 weeks  Hospital delirium Mild dementia at baseline --1:1 sitter  Insulin-dependent diabetes Patient is on flex touch 32 units at bedtime Also on Metformin 1000 mg daily Also on glimepiride Plan: Hold oral agents Lantus 10 units every afternoon Moderate sliding scale Carb modified diet when diet advanced  Coronary artery disease Continue daily aspirin Sublingual nitroglycerin  Hyperlipidemia Continue simvastatin  Depression Restless leg Sertraline 50 mg nightly Pramipexole 0.125 mg nightly  GERD Protonix 40 mg daily  History of CVA Daily ASA   DVT prophylaxis: Lovenox SQ Code Status: Full code  Family Communication: Wife updated on the phone Disposition Plan: Wife wants to take pt home with HHPT.  Wife is asked to come tomorrow to get instructions from PT on how to take care of pt at home.  Can discharge tomorrow or next day.   Subjective and Interval History:  Nursing reported pt was confused, agitated overnight and trying to climb out of bed and pull on lines.  Had to have a sitter.  Pt was better during the day.  Denied pain.  Ate well.  Normal  urination.  No fever, dyspnea, chest pain, abdominal pain, N/V/D, dysuria.    Objective: Vitals:   10/23/19 0824 10/23/19 0920 10/23/19 1228 10/23/19 1558  BP: 133/63  136/65 137/84  Pulse: 76 78 87 84  Resp: 17  (!) 22 20  Temp: 98.3 F (36.8 C)  97.7 F (36.5 C) 99.5 F (37.5 C)  TempSrc: Oral  Oral Oral  SpO2: 100% 96% 98% 99%  Weight:      Height:        Intake/Output Summary (Last 24 hours) at 10/23/2019 1626 Last data filed at 10/23/2019 0900 Gross per 24 hour  Intake 440 ml  Output 850 ml  Net -410 ml   Filed Weights   10/21/19 1603 10/21/19 2251 10/22/19 1237  Weight: 77.1 kg 77.1 kg 77.1 kg    Examination:   Constitutional: NAD, AAOx3 HEENT: conjunctivae and lids normal, EOMI CV: RRR no M,R,G. Distal pulses +2.  No cyanosis.   RESP: CTA B/L, normal respiratory effort  GI: +BS, NTND Extremities: No effusions, edema in BLE SKIN: warm, dry and intact Neuro: II - XII grossly intact.  Sensation intact Psych: Normal mood and affect.  Appropriate judgement and reason   Data Reviewed: I have personally reviewed following labs and imaging studies  CBC: Recent Labs  Lab 10/21/19 1733 10/22/19 0510 10/23/19 0628  WBC 12.1* 12.6* 11.5*  NEUTROABS  --  9.1*  --   HGB 14.8 14.5 12.5*  HCT 42.6 41.6 36.4*  MCV 88.0 87.4 88.3  PLT 178 186 156   Basic Metabolic Panel: Recent Labs  Lab 10/21/19 1733 10/22/19  0510 10/23/19 0628  NA 136 133* 134*  K 4.0 4.0 4.2  CL 103 99 104  CO2 27 26 25   GLUCOSE 122* 208* 174*  BUN 17 17 16   CREATININE 0.74 0.67 0.67  CALCIUM 9.6 9.2 8.6*  MG  --   --  2.1   GFR: Estimated Creatinine Clearance: 76.3 mL/min (by C-G formula based on SCr of 0.67 mg/dL). Liver Function Tests: No results for input(s): AST, ALT, ALKPHOS, BILITOT, PROT, ALBUMIN in the last 168 hours. No results for input(s): LIPASE, AMYLASE in the last 168 hours. No results for input(s): AMMONIA in the last 168 hours. Coagulation Profile: Recent Labs   Lab 10/21/19 1735 10/22/19 0510  INR 1.0 1.1   Cardiac Enzymes: No results for input(s): CKTOTAL, CKMB, CKMBINDEX, TROPONINI in the last 168 hours. BNP (last 3 results) No results for input(s): PROBNP in the last 8760 hours. HbA1C: Recent Labs    10/21/19 2324  HGBA1C 9.0*   CBG: Recent Labs  Lab 10/22/19 1648 10/22/19 2122 10/22/19 2342 10/23/19 0824 10/23/19 1158  GLUCAP 127* 237* 194* 162* 130*   Lipid Profile: No results for input(s): CHOL, HDL, LDLCALC, TRIG, CHOLHDL, LDLDIRECT in the last 72 hours. Thyroid Function Tests: No results for input(s): TSH, T4TOTAL, FREET4, T3FREE, THYROIDAB in the last 72 hours. Anemia Panel: No results for input(s): VITAMINB12, FOLATE, FERRITIN, TIBC, IRON, RETICCTPCT in the last 72 hours. Sepsis Labs: No results for input(s): PROCALCITON, LATICACIDVEN in the last 168 hours.  Recent Results (from the past 240 hour(s))  Respiratory Panel by RT PCR (Flu A&B, Covid) - Nasopharyngeal Swab     Status: None   Collection Time: 10/21/19  5:35 PM   Specimen: Nasopharyngeal Swab  Result Value Ref Range Status   SARS Coronavirus 2 by RT PCR NEGATIVE NEGATIVE Final    Comment: (NOTE) SARS-CoV-2 target nucleic acids are NOT DETECTED. The SARS-CoV-2 RNA is generally detectable in upper respiratoy specimens during the acute phase of infection. The lowest concentration of SARS-CoV-2 viral copies this assay can detect is 131 copies/mL. A negative result does not preclude SARS-Cov-2 infection and should not be used as the sole basis for treatment or other patient management decisions. A negative result may occur with  improper specimen collection/handling, submission of specimen other than nasopharyngeal swab, presence of viral mutation(s) within the areas targeted by this assay, and inadequate number of viral copies (<131 copies/mL). A negative result must be combined with clinical observations, patient history, and epidemiological information.  The expected result is Negative. Fact Sheet for Patients:  PinkCheek.be Fact Sheet for Healthcare Providers:  GravelBags.it This test is not yet ap proved or cleared by the [place] FDA and  has been authorized for detection and/or diagnosis of SARS-CoV-2 by FDA under an Emergency Use Authorization (EUA). This EUA will remain  in effect (meaning this test can be used) for the duration of the COVID-19 declaration under Section 564(b)(1) of the Act, 21 U.S.C. section 360bbb-3(b)(1), unless the authorization is terminated or revoked sooner.    Influenza A by PCR NEGATIVE NEGATIVE Final   Influenza B by PCR NEGATIVE NEGATIVE Final    Comment: (NOTE) The Xpert Xpress SARS-CoV-2/FLU/RSV assay is intended as an aid in  the diagnosis of influenza from Nasopharyngeal swab specimens and  should not be used as a sole basis for treatment. Nasal washings and  aspirates are unacceptable for Xpert Xpress SARS-CoV-2/FLU/RSV  testing. Fact Sheet for Patients: PinkCheek.be Fact Sheet for Healthcare Providers: GravelBags.it This test is  not yet approved or cleared by the [place] and  has been authorized for detection and/or diagnosis of SARS-CoV-2 by  FDA under an Emergency Use Authorization (EUA). This EUA will remain  in effect (meaning this test can be used) for the duration of the  Covid-19 declaration under Section 564(b)(1) of the Act, 21  U.S.C. section 360bbb-3(b)(1), unless the authorization is  terminated or revoked. Performed at [facility], [address] [place], [place] [address]   MRSA PCR Screening     Status: None   Collection Time: 10/22/19  4:53 AM   Specimen: Nasal Mucosa; Nasopharyngeal  Result Value Ref Range Status   MRSA by PCR NEGATIVE NEGATIVE Final    Comment:        The GeneXpert MRSA Assay (FDA approved for NASAL specimens  only), is one component of a comprehensive MRSA colonization surveillance program. It is not intended to diagnose MRSA infection nor to guide or monitor treatment for MRSA infections. Performed at [facility], [address], [place], [place] [address]       Radiology Studies: DG Chest 1 View  Result Date: 10/21/2019 CLINICAL DATA:  Fall. EXAM: CHEST  1 VIEW COMPARISON:  06/07/2017 FINDINGS: Cardiac enlargement without heart failure. Lungs are clear and well aerated. No infiltrate or effusion. Chronic right rib fractures. IMPRESSION: No active disease. Electronically Signed   By: Marlan Palau M.D.   On: 10/21/2019 17:05   DG HIP OPERATIVE UNILAT W OR W/O PELVIS RIGHT  Result Date: 10/22/2019 CLINICAL DATA:  Right hip surgery EXAM: OPERATIVE right HIP (WITH PELVIS IF PERFORMED) 6 VIEWS TECHNIQUE: Fluoroscopic spot image(s) were submitted for interpretation post-operatively. COMPARISON:  10/21/2019 FINDINGS: Six low resolution intraoperative spot views of the right hip. Total fluoroscopy time was 1 minutes 30 seconds. The images demonstrate intramedullary rod fixation of right intertrochanteric fracture with anatomic alignment IMPRESSION: Intraoperative fluoroscopic assistance provided during surgical fixation of right hip fracture Electronically Signed   By: Jasmine Pang M.D.   On: 10/22/2019 17:06   DG Hip Unilat W or Wo Pelvis 2-3 Views Right  Result Date: 10/21/2019 CLINICAL DATA:  Fall. EXAM: DG HIP (WITH OR WITHOUT PELVIS) 2-3V RIGHT COMPARISON:  None. FINDINGS: Intertrochanteric fracture right hip with mild angulation. Right hip joint normal. Diffuse arterial calcification. Degenerative change and spurring in the lumbar spine. IMPRESSION: Intertrochanteric fracture right femur. Electronically Signed   By: Marlan Palau M.D.   On: 10/21/2019 17:04     Scheduled Meds: . acetaminophen  1,000 mg Oral Q8H  . aspirin EC  81 mg Oral Daily  . calcium-vitamin D  1 tablet  Oral Q0600  . docusate sodium  100 mg Oral BID  . enoxaparin (LOVENOX) injection  40 mg Subcutaneous Q24H  . insulin aspart  0-15 Units Subcutaneous TID WC  . insulin aspart  0-5 Units Subcutaneous QHS  . insulin detemir  10 Units Subcutaneous QHS  . latanoprost  1 drop Left Eye QHS  . multivitamin with minerals  1 tablet Oral Daily  . pantoprazole  40 mg Oral Daily  . pramipexole  0.125 mg Oral QHS  . Ensure Max Protein  11 oz Oral BID BM  . sertraline  50 mg Oral QHS  . simvastatin  40 mg Oral QHS   Continuous Infusions: . sodium chloride    . methocarbamol (ROBAXIN) IV       LOS: 2 days     Darlin Priestly, MD [facility] If 7PM-7AM, please contact  night-coverage 10/23/2019, 4:26 PM

## 2019-10-23 NOTE — Progress Notes (Signed)
Pt made several attempts to exit the bed, remove foley, and take down surgical dressing. Surgical site had a moderate amount of drainage and was re-enforced with abd pads. Pt was give PRN pain meds for pain during the night. Pt was unaware of safety issues and required a staff to sit with him for the bulk of the shift.  MeishaR,LPN

## 2019-10-23 NOTE — Progress Notes (Signed)
Physical Therapy Treatment Patient Details Name: [name] [name]. MRN: [id number] DOB: October 05, 1933 Today's Date: 10/23/2019    History of Present Illness [name] Mau. is a 86 y.o. male with medical history significant of CVA, CAD, GERD, glaucoma, hyperlipidemia, insulin-dependent diabetes who presented to the hospital by EMS 10/21/2019 after fall off equipment while at the gym and a resultant intertrochanteric right hip fracture for which he was admitted to the hospital. Underwent intramedullary nailing of R femur on 10/22/2019 and is now WBAT on that LE. Further medical history includes memory loss, osteoporosis, ACLR.    PT Comments    Patient continues to be drowsy but was easily awakened and agreed to participation in physical therapy, thanking PT for working with him at end of session. Patient was able to move his feet off edge of bed with min A but required max A +2 to move from supine to seated at edge of bed. Able to tolerate seated exercises for LE but was confused throughout session with cuing and had difficulty moving R lower leg, lacking knee extension in seated. Also able to progress to standing at EOB standing up from elevated bed to RW with min A. Able to stand about 5 min before becoming fatigued and needing to sit. HR and SpO2 remained stable during session on 2L/min O2. Patient continues to be quit limited by pain and calls out when assisted with R LE. Prefers to move that leg by himself but is unable to complete bed mobility without help. Patient would benefit from continued skilled physical therapy to address remaining impairments and functional limitations to work towards stated goals and return to PLOF or maximal functional independence.      Follow Up Recommendations  SNF     Equipment Recommendations  Rolling walker with 5" wheels;3in1 (PT)    Recommendations for Other Services OT consult     Precautions / Restrictions Precautions Precautions:  Fall Restrictions Weight Bearing Restrictions: Yes RLE Weight Bearing: Weight bearing as tolerated    Mobility  Bed Mobility Overal bed mobility: Needs Assistance Bed Mobility: Supine to Sit;Sit to Supine Rolling: Mod assist   Supine to sit: Max assist;+2 for physical assistance;HOB elevated Sit to supine: Max assist;+2 for physical assistance;+2 for safety/equipment   General bed mobility comments: Patient transfered supine <> sit at EOB with max A +2 (assist at torso and legs). Pateint moved both feet off edge of bed with coaxing and min A but then required max A +2 to move him to seated position. Able to sit at EOB for several minutes completeing seated exercises for LE.  Transfers Overall transfer level: Needs assistance Equipment used: Standard walker Transfers: Sit to/from Stand;Lateral/Scoot Transfers Sit to Stand: Min assist;From elevated surface        Lateral/Scoot Transfers: From elevated surface;Min guard General transfer comment: Patient completed sit <> stand x2 and scooting to the left from edge of bed elevated to mid thigh. He required min A to acheive standing position but was able to scoot a few inches at a time with CGA for safety. Used BUE on walker to sit <> stand despite cuing to push off bed. Minimal weight bearing through R LE. maintained standing for about 5 minutes during the first stand  Ambulation/Gait             General Gait Details: unable at this time.   Stairs             Wheelchair Mobility    Modified  Rankin (Stroke Patients Only)       Balance   Sitting-balance support: Feet supported;Bilateral upper extremity supported Sitting balance-Leahy Scale: Fair Sitting balance - Comments: min A - SBA for sitting balance at edge of bed. improved with time     Standing balance-Leahy Scale: Poor Standing balance comment: Patient able to stand at edge of bed with minA - CGA and stooped posture. Dependent on RW and occasionally PT for  balance.                            Cognition Arousal/Alertness: Lethargic Behavior During Therapy: WFL for tasks assessed/performed;Agitated(required some cues to maintain attention) Overall Cognitive Status: No family/caregiver present to determine baseline cognitive functioning                                 General Comments: Patient drowsy but was able to be easily awakened and participated. Continues to be confused in general, stated he did not know why he has pain at first but later able to describe his injury at one point .      Exercises Other Exercises Other Exercises: Seated LAQ x 15 with R LE: patient with minimal range and confused on how to perform task, but improved with time. Also performed about 10 reps on left LE with good form. Other Exercises: Seated ankle pumps and writing his children's names with his R ankle while seated at EOB.    General Comments        Pertinent Vitals/Pain Pain Assessment: Faces Faces Pain Scale: Hurts whole lot Pain Location: Patient unable to provide numeric rating but calls out when clinician touches R LE or assist him in moving it. Minimal physical resistance to moving leg when assisting supine <> sit with max A +2. Pain Descriptors / Indicators: Aching;Heaviness;Operative site guarding;Tender Pain Intervention(s): Limited activity within patient's tolerance;Repositioned;Monitored during session;Other (comment)(checked with nursing just prior to tx about pain med schedule - nursing reported pt not appropriate for pain meds due to cognition)    [place] expects to be discharged to:: Private residence Living Arrangements: Spouse/significant other Available Help at Discharge: Family Type of Home: House Home Access: Stairs to enter Entrance Stairs-Rails: Can reach both Home Layout: Two level Home Equipment: Bricelyn - single point;Shower seat;Walker - 2 wheels Additional Comments: Patient drowsy at  evaluation and gave conflicting information to PT    Prior Function Level of Independence: Independent      Comments: Patient states he was I with all ADLs, IADLs, and driving. States he ambulates with cane. Able to recall that he was at the [facility] when he fell prior to hospitalization.   PT Goals (current goals can now be found in the care plan section) Acute Rehab PT Goals Patient Stated Goal: I want to be moving again. PT Goal Formulation: With patient Time For Goal Achievement: 11/06/19 Progress towards PT goals: Progressing toward goals    Frequency    BID      PT Plan Current plan remains appropriate    Co-evaluation              AM-PAC PT "6 Clicks" Mobility   Outcome Measure  Help needed turning from your back to your side while in a flat bed without using bedrails?: A Lot Help needed moving from lying on your back to sitting on the side of a flat bed without  using bedrails?: A Lot Help needed moving to and from a bed to a chair (including a wheelchair)?: Total Help needed standing up from a chair using your arms (e.g., wheelchair or bedside chair)?: A Little Help needed to walk in hospital room?: Total Help needed climbing 3-5 steps with a railing? : Total 6 Click Score: 10    End of Session Equipment Utilized During Treatment: Oxygen;Gait belt(2L/min) Activity Tolerance: Patient limited by pain;Patient limited by lethargy Patient left: in bed;with nursing/sitter in room Nurse Communication: Mobility status PT Visit Diagnosis: Unsteadiness on feet (R26.81);History of falling (Z91.81);Difficulty in walking, not elsewhere classified (R26.2);Pain;Muscle weakness (generalized) (M62.81) Pain - Right/Left: Right Pain - part of body: Hip     Time: 6840-3353 PT Time Calculation (min) (ACUTE ONLY): 19 min  Charges:  $Therapeutic Activity: 8-22 mins                    Luretha Murphy. Ilsa Iha, PT, DPT 10/23/19, 2:10 PM

## 2019-10-23 NOTE — Discharge Instructions (Signed)
INSTRUCTIONS AFTER Surgery  o Remove items at home which could result in a fall. This includes throw rugs or furniture in walking pathways o ICE to the affected joint every three hours while awake for 30 minutes at a time, for at least the first 3-5 days, and then as needed for pain and swelling.  Continue to use ice for pain and swelling. You may notice swelling that will progress down to the foot and ankle.  This is normal after surgery.  Elevate your leg when you are not up walking on it.   o Continue to use the breathing machine you got in the hospital (incentive spirometer) which will help keep your temperature down.  It is common for your temperature to cycle up and down following surgery, especially at night when you are not up moving around and exerting yourself.  The breathing machine keeps your lungs expanded and your temperature down.   DIET:  As you were doing prior to hospitalization, we recommend a well-balanced diet.  DRESSING / WOUND CARE / SHOWERING  Dressing change as needed.  No showering.  Staples will be removed at [facility] clinic in 2 weeks.  ACTIVITY  o Increase activity slowly as tolerated, but follow the weight bearing instructions below.   o No driving for 6 weeks or until further direction given by your physician.  You cannot drive while taking narcotics.  o No lifting or carrying greater than 10 lbs. until further directed by your surgeon. o Avoid periods of inactivity such as sitting longer than an hour when not asleep. This helps prevent blood clots.  o You may return to work once you are authorized by your doctor.     WEIGHT BEARING  Weight bearing as tolerated.   EXERCISES Gait training and strengthening.  CONSTIPATION  Constipation is defined medically as fewer than three stools per week and severe constipation as less than one stool per week.  Even if you have a regular bowel pattern at home, your normal regimen is likely to be disrupted due to  multiple reasons following surgery.  Combination of anesthesia, postoperative narcotics, change in appetite and fluid intake all can affect your bowels.   YOU MUST use at least one of the following options; they are listed in order of increasing strength to get the job done.  They are all available over the counter, and you may need to use some, POSSIBLY even all of these options:    Drink plenty of fluids (prune juice may be helpful) and high fiber foods Colace 100 mg by mouth twice a day  Senokot for constipation as directed and as needed Dulcolax (bisacodyl), take with full glass of water  Miralax (polyethylene glycol) once or twice a day as needed.  If you have tried all these things and are unable to have a bowel movement in the first 3-4 days after surgery call either your surgeon or your primary doctor.    If you experience loose stools or diarrhea, hold the medications until you stool forms back up.  If your symptoms do not get better within 1 week or if they get worse, check with your doctor.  If you experience "the worst abdominal pain ever" or develop nausea or vomiting, please contact the office immediately for further recommendations for treatment.   ITCHING:  If you experience itching with your medications, try taking only a single pain pill, or even half a pain pill at a time.  You can also use Benadryl over  the counter for itching or also to help with sleep.   TED HOSE STOCKINGS:  Use stockings on both legs until for at least 2 weeks or as directed by physician office. They may be removed at night for sleeping.  MEDICATIONS:  See your medication summary on the "After Visit Summary" that nursing will review with you.  You may have some home medications which will be placed on hold until you complete the course of blood thinner medication.  It is important for you to complete the blood thinner medication as prescribed.  PRECAUTIONS:  If you experience chest pain or shortness of  breath - call 911 immediately for transfer to the hospital emergency department.   If you develop a fever greater that 101 F, purulent drainage from wound, increased redness or drainage from wound, foul odor from the wound/dressing, or calf pain - CONTACT YOUR SURGEON.                                                   FOLLOW-UP APPOINTMENTS:  If you do not already have a post-op appointment, please call the office for an appointment to be seen by your surgeon.  Guidelines for how soon to be seen are listed in your "After Visit Summary", but are typically between 1-4 weeks after surgery.  OTHER INSTRUCTIONS:     MAKE SURE YOU:  . Understand these instructions.  . Get help right away if you are not doing well or get worse.    Thank you for letting us be a part of your medical care team.  It is a privilege we respect greatly.  We hope these instructions will help you stay on track for a fast and full recovery!

## 2019-10-23 NOTE — Evaluation (Signed)
Occupational Therapy Evaluation Patient Details Name: [name] [name]. MRN: [id number] DOB: 10-Mar-1934 Today's Date: 10/23/2019    History of Present Illness [name] [name]. is a 86 y.o. male with medical history significant of CVA, CAD, GERD, glaucoma, hyperlipidemia, insulin-dependent diabetes who presented to the hospital by EMS 10/21/2019 after fall off equipment while at the gym and a resultant intertrochanteric right hip fracture for which he was admitted to the hospital. Underwent intramedullary nailing of R femur on 10/22/2019 and is now WBAT on that LE. Further medical history includes memory loss, osteoporosis, ACLR.   Clinical Impression   Patient seen this AM for occupational therapy evaluation.  Patient supine in bed with sitter present and agreeable to therapy.  Patient noted to be lethargic but was easily roused.  Patient a poor historian regarding PMH but was able to recall his injury.  Patient required extra time for all movements due to pain, but was agreeable to therapy.  Patient able to transition to EOB with HOB elevated with MAX A.  MOD A to assist him scooting forward to have feet flat on the floor.  Tolerated graded activity and simple grooming tasks at EOB with L UE support for ~10 minutes.  Patient stated he felt he was unable to stand at this time and would try tomorrow.  Transitioned back to supine from EOB with MAX A x 2.  Patient able to scoot himself up in bed with MIN A.  Performed log rolling for comfort with MOD/MAX A.   Patient thankful for therapy session.  Would benefit from further skilled occupational therapy to address activity tolerance, strength, safety awareness, functional transfers, functional mobility, family/patient education and ADL retraining.  Based on this performance, recommending SNF for discharge plan.      Follow Up Recommendations  SNF    Equipment Recommendations  Other (comment)(defer to next level of care)    Recommendations for Other  Services       Precautions / Restrictions Restrictions Weight Bearing Restrictions: Yes RLE Weight Bearing: Weight bearing as tolerated      Mobility Bed Mobility Overal bed mobility: Needs Assistance Bed Mobility: Rolling;Supine to Sit;Sit to Supine Rolling: Mod assist   Supine to sit: Max assist Sit to supine: Max assist;+2 for physical assistance;+2 for safety/equipment   General bed mobility comments: Patient able to move from supine<>EOB with MAX A.  MOD A to scoot forward to have feet flat on floor.  Tolerated EOB for ~10 minutes.  Patient able to scoot laterally with MAX A.  Patient transitioned from EOB<>supine with MAX A x 2.  Able to scoot self up in bed with MIN A.  Transfers                 General transfer comment: Unable to assess d/t pain.  Patient states he would try tomorrow.    Balance Overall balance assessment: History of Falls;Needs assistance Sitting-balance support: Single extremity supported;Feet supported Sitting balance-Leahy Scale: Fair Sitting balance - Comments: SBA for sitting balance with feet flat on floor, able to minimally maintain balance without UE support                                ADL either performed or assessed with clinical judgement   ADL Overall ADL's : Needs assistance/impaired     Grooming: Wash/dry face;Brushing hair;Sitting;Min guard Grooming Details (indicate cue type and reason): sitting at EOB  Toilet Transfer Details (indicate cue type and reason): Unable to perform secondary to pain           General ADL Comments: Unable to assess due to pain     Vision Baseline Vision/History: Wears glasses Wears Glasses: At all times       Perception     Praxis      Pertinent Vitals/Pain Pain Assessment: Faces Faces Pain Scale: Hurts whole lot Pain Location: Patient feels pain in his whole R LE but was unable to verbalize a number on the pain scale.  Pain only noted with  movement. Pain Descriptors / Indicators: Aching;Heaviness;Operative site guarding;Tender Pain Intervention(s): Monitored during session;Limited activity within patient's tolerance;Repositioned     Hand Dominance Right   Extremity/Trunk Assessment Upper Extremity Assessment Upper Extremity Assessment: Overall WFL for tasks assessed   Lower Extremity Assessment Lower Extremity Assessment: RLE deficits/detail;Defer to PT evaluation RLE Deficits / Details: Post-operative RLE: Unable to fully assess due to pain RLE Coordination: decreased gross motor   Cervical / Trunk Assessment Cervical / Trunk Assessment: Normal   Communication Communication Communication: Other (comment);HOH   Cognition Arousal/Alertness: Lethargic Behavior During Therapy: WFL for tasks assessed/performed(required some cues to maintain attention) Overall Cognitive Status: No family/caregiver present to determine baseline cognitive functioning                                 General Comments: Patient drowsy but was able to be easily awakened.   General Comments       Exercises    Shoulder Instructions      Home Living Family/patient expects to be discharged to:: Private residence Living Arrangements: Spouse/significant other Available Help at Discharge: Family Type of Home: House Home Access: Stairs to enter [employer] of Steps: Patient reported to OT there are 4, but to PT there are 11 Entrance Stairs-Rails: Can reach both Home Layout: Two level Alternate Level Stairs-Number of Steps: patient unable to state, says bedrooms are upstairs and he is unable to live on main floor. Alternate Level Stairs-Rails: (patient unable to state) Bathroom Shower/Tub: Teacher, early years/pre: Standard     Home Equipment: Cane - single point;Shower seat;Walker - 2 wheels   Additional Comments: Patient drowsy at evaluation and gave conflicting information to PT      Prior  Functioning/Environment Level of Independence: Independent        Comments: Patient states he was I with all ADLs, IADLs, and driving. States he ambulates with cane. Able to recall that he was at the [facility] when he fell prior to hospitalization.        OT Problem List: Decreased strength;Decreased activity tolerance;Impaired balance (sitting and/or standing);Decreased safety awareness;Impaired sensation;Pain;Other (comment)(post - op)      OT Treatment/Interventions: Self-care/ADL training;Therapeutic exercise;Patient/family education;Balance training;Energy conservation;Therapeutic activities;DME and/or AE instruction    OT Goals(Current goals can be found in the care plan section) Acute Rehab OT Goals Patient Stated Goal: I want to be moving again. OT Goal Formulation: With patient Time For Goal Achievement: 11/06/19 Potential to Achieve Goals: Good  OT Frequency: Min 2X/week   Barriers to D/C:            Co-evaluation              AM-PAC OT "6 Clicks" Daily Activity     Outcome Measure Help from another person eating meals?: None Help from another person taking care of personal grooming?: None  Help from another person toileting, which includes using toliet, bedpan, or urinal?: A Lot Help from another person bathing (including washing, rinsing, drying)?: A Lot Help from another person to put on and taking off regular upper body clothing?: A Little Help from another person to put on and taking off regular lower body clothing?: A Lot 6 Click Score: 17   End of Session    Activity Tolerance: Patient limited by pain;Patient limited by lethargy Patient left: in bed;with call bell/phone within reach;with nursing/sitter in room  OT Visit Diagnosis: History of falling (Z91.81);Muscle weakness (generalized) (M62.81);Pain Pain - Right/Left: Right Pain - part of body: Leg                Time: 1601-0932 OT Time Calculation (min): 40 min Charges:  OT General Charges $OT  Visit: 1 Visit OT Evaluation $OT Eval Low Complexity: 1 Low OT Treatments $Self Care/Home Management : 8-22 mins $Therapeutic Activity: 23-37 mins  Louanne Belton, MS, OTR/L 10/23/19, 11:40 AM

## 2019-10-23 NOTE — Anesthesia Postprocedure Evaluation (Signed)
Anesthesia Post Note  Patient: [name] [name].  Procedure(s) Performed: INTRAMEDULLARY (IM) NAIL INTERTROCHANTRIC (Right Hip)  Patient location during evaluation: Nursing Unit Anesthesia Type: Spinal Level of consciousness: awake and alert Pain management: pain level controlled Vital Signs Assessment: post-procedure vital signs reviewed and stable Respiratory status: spontaneous breathing and respiratory function stable Cardiovascular status: blood pressure returned to baseline and stable Postop Assessment: no headache, no backache, no apparent nausea or vomiting and patient able to bend at knees Anesthetic complications: no     Last Vitals:  Vitals:   10/22/19 2342 10/23/19 0011  BP: (!) 168/69 (!) 152/66  Pulse: 89 83  Resp:    Temp: 37.1 C   SpO2: 96%     Last Pain:  Vitals:   10/23/19 0525  TempSrc:   PainSc: Leeroy Bock

## 2019-10-23 NOTE — NC FL2 (Signed)
[place] LEVEL OF CARE SCREENING TOOL     IDENTIFICATION  Patient Name: [name] [name]. Birthdate: [birth year] Sex: male Admission Date (Current Location): 10/21/2019  [place] and [place] Number:  Engineering geologist and Address:  [facility], [address], [place], [place] [address]      Provider Number: [id number]  Attending Physician Name and Address:  Enzo Bi, MD  Relative Name and Phone Number:  Sosaia Pittinger [phone]    Current Level of Care: Hospital Recommended Level of Care: [place] Prior Approval Number:    Date Approved/Denied:   PASRR Number: [id number]  Discharge Plan: SNF    Current Diagnoses: Patient Active Problem List   Diagnosis Date Noted  . Hip fracture ([place]) 10/21/2019  . Weakness 06/07/2017  . PAD (peripheral artery disease) ([place]) 09/07/2014  . Diabetes mellitus ([place]) 09/21/2011  . Hyperlipidemia 03/25/2009  . CAD, NATIVE VESSEL 03/25/2009  . CHEST PAIN-PRECORDIAL 03/25/2009    Orientation RESPIRATION BLADDER Height & Weight     Self, Situation, Place  O2(2 liters continuous) External catheter Weight: 169 lb 15.6 oz (77.1 kg) Height:  6' (182.9 cm)  BEHAVIORAL SYMPTOMS/MOOD NEUROLOGICAL BOWEL NUTRITION STATUS      Continent Diet(Heart Healthy thin liquids)  AMBULATORY STATUS COMMUNICATION OF NEEDS Skin   Limited Assist Verbally Surgical wounds                       Personal Care Assistance Level of Assistance  Bathing, Dressing Bathing Assistance: Limited assistance   Dressing Assistance: Limited assistance     Functional Limitations Info             SPECIAL CARE FACTORS FREQUENCY  PT (By licensed PT), OT (By licensed OT)     PT Frequency: 5x week OT Frequency: 3x week            Contractures Contractures Info: Not present    Additional Factors Info  Code Status, Allergies Code Status Info: Full Code Allergies Info: Morphine,  Chlorhexidine, Hydrocondone           Current Medications (10/23/2019):  This is the current hospital active medication list Current Facility-Administered Medications  Medication Dose Route Frequency Provider Last Rate Last Admin  . 0.9 %  sodium chloride infusion   Intravenous PRN Enzo Bi, MD      . acetaminophen (TYLENOL) tablet 1,000 mg  1,000 mg Oral Q8H Leim Fabry, MD   1,000 mg at 10/22/19 2123  . aspirin EC tablet 81 mg  81 mg Oral Daily Enzo Bi, MD   81 mg at 10/23/19 0946  . bisacodyl (DULCOLAX) suppository 10 mg  10 mg Rectal Daily PRN Leim Fabry, MD      . calcium-vitamin D (OSCAL WITH D) 500-200 MG-UNIT per tablet 1 tablet  1 tablet Oral Q0600 Leim Fabry, MD      . docusate sodium (COLACE) capsule 100 mg  100 mg Oral BID Leim Fabry, MD   100 mg at 10/23/19 0946  . enoxaparin (LOVENOX) injection 40 mg  40 mg Subcutaneous Q24H Leim Fabry, MD   40 mg at 10/23/19 0841  . HYDROmorphone (DILAUDID) injection 0.25-0.5 mg  0.25-0.5 mg Intravenous Q2H PRN Leim Fabry, MD   0.5 mg at 10/23/19 0455  . insulin aspart (novoLOG) injection 0-15 Units  0-15 Units Subcutaneous TID WC Leim Fabry, MD   3 Units at 10/23/19 [phone]  . insulin aspart (novoLOG) injection 0-5 Units  0-5 Units  Subcutaneous QHS Signa Kell, MD   2 Units at 10/22/19 2142  . insulin detemir (LEVEMIR) injection 10 Units  10 Units Subcutaneous QHS Signa Kell, MD   10 Units at 10/22/19 2142  . latanoprost (XALATAN) 0.005 % ophthalmic solution 1 drop  1 drop Left Eye Deirdre Evener, MD   1 drop at 10/22/19 2125  . methocarbamol (ROBAXIN) tablet 500 mg  500 mg Oral Q6H PRN Signa Kell, MD       Or  . methocarbamol (ROBAXIN) 500 mg in dextrose 5 % 50 mL IVPB  500 mg Intravenous Q6H PRN Signa Kell, MD      . metoCLOPramide (REGLAN) tablet 5-10 mg  5-10 mg Oral Q8H PRN Signa Kell, MD       Or  . metoCLOPramide (REGLAN) injection 5-10 mg  5-10 mg Intravenous Q8H PRN Signa Kell, MD      . multivitamin with  minerals tablet 1 tablet  1 tablet Oral Daily Darlin Priestly, MD   1 tablet at 10/23/19 0946  . nitroGLYCERIN (NITROSTAT) SL tablet 0.4 mg  0.4 mg Sublingual Q5 min PRN Signa Kell, MD      . ondansetron [facility]) tablet 4 mg  4 mg Oral Q6H PRN Signa Kell, MD       Or  . ondansetron [facility]) injection 4 mg  4 mg Intravenous Q6H PRN Signa Kell, MD      . oxyCODONE (Oxy IR/ROXICODONE) immediate release tablet 2.5-5 mg  2.5-5 mg Oral Q3H PRN Signa Kell, MD      . oxyCODONE (Oxy IR/ROXICODONE) immediate release tablet 5-10 mg  5-10 mg Oral Q4H PRN Signa Kell, MD   10 mg at 10/23/19 0113  . pantoprazole (PROTONIX) EC tablet 40 mg  40 mg Oral Daily Signa Kell, MD   40 mg at 10/23/19 0946  . pramipexole (MIRAPEX) tablet 0.125 mg  0.125 mg Oral QHS Signa Kell, MD   0.125 mg at 10/22/19 2124  . protein supplement (ENSURE MAX) liquid  11 oz Oral BID BM Darlin Priestly, MD   11 oz at 10/23/19 0945  . senna-docusate (Senokot-S) tablet 1 tablet  1 tablet Oral QHS PRN Signa Kell, MD      . sertraline (ZOLOFT) tablet 50 mg  50 mg Oral QHS Signa Kell, MD   50 mg at 10/22/19 2123  . simvastatin (ZOCOR) tablet 40 mg  40 mg Oral QHS Signa Kell, MD   40 mg at 10/22/19 2123  . sodium phosphate (FLEET) 7-19 GM/118ML enema 1 enema  1 enema Rectal Once PRN Signa Kell, MD      . traMADol Janean Sark) tablet 50 mg  50 mg Oral Q6H PRN Signa Kell, MD   50 mg at 10/22/19 1831     Discharge Medications: Please see discharge summary for a list of discharge medications.  Relevant Imaging Results:  Relevant Lab Results:   Additional Information SSN: [id number]  Ilona Colley, Lemar Livings, LCSW

## 2019-10-24 LAB — CBC
HCT: 35.6 % — ABNORMAL LOW (ref 39.0–52.0)
Hemoglobin: 12.4 g/dL — ABNORMAL LOW (ref 13.0–17.0)
MCH: 30.9 pg (ref 26.0–34.0)
MCHC: 34.8 g/dL (ref 30.0–36.0)
MCV: 88.8 fL (ref 80.0–100.0)
Platelets: 164 10*3/uL (ref 150–400)
RBC: 4.01 MIL/uL — ABNORMAL LOW (ref 4.22–5.81)
RDW: 12.9 % (ref 11.5–15.5)
WBC: 10.4 10*3/uL (ref 4.0–10.5)
nRBC: 0 % (ref 0.0–0.2)

## 2019-10-24 LAB — GLUCOSE, CAPILLARY
Glucose-Capillary: 181 mg/dL — ABNORMAL HIGH (ref 70–99)
Glucose-Capillary: 214 mg/dL — ABNORMAL HIGH (ref 70–99)
Glucose-Capillary: 208 mg/dL — ABNORMAL HIGH (ref 70–99)

## 2019-10-24 LAB — BASIC METABOLIC PANEL WITH GFR
Anion gap: 7 (ref 5–15)
BUN: 17 mg/dL (ref 8–23)
CO2: 25 mmol/L (ref 22–32)
Calcium: 8.6 mg/dL — ABNORMAL LOW (ref 8.9–10.3)
Chloride: 98 mmol/L (ref 98–111)
Creatinine, Ser: 0.73 mg/dL (ref 0.61–1.24)
GFR calc Af Amer: >60 mL/min
GFR calc non Af Amer: >60 mL/min
Glucose, Bld: 203 mg/dL — ABNORMAL HIGH (ref 70–99)
Potassium: 3.9 mmol/L (ref 3.5–5.1)
Sodium: 130 mmol/L — ABNORMAL LOW (ref 135–145)

## 2019-10-24 LAB — MAGNESIUM: Magnesium: 2.2 mg/dL (ref 1.7–2.4)

## 2019-10-24 MED ORDER — ACETAMINOPHEN 500 MG PO TABS
1000.0000 mg | ORAL_TABLET | Freq: Three times a day (TID) | ORAL | Status: DC | PRN
  Administered 2019-10-24 – 2019-10-26 (×4): 1000 mg via ORAL
  Filled 2019-10-24 (×4): qty 2

## 2019-10-24 MED ORDER — QUETIAPINE FUMARATE 25 MG PO TABS
25.0000 mg | ORAL_TABLET | Freq: Once | ORAL | Status: AC
  Administered 2019-10-24: 25 mg via ORAL
  Filled 2019-10-24: qty 1

## 2019-10-24 MED ORDER — LORAZEPAM 2 MG/ML IJ SOLN
1.0000 mg | Freq: Once | INTRAMUSCULAR | Status: AC
  Administered 2019-10-24: 1 mg via INTRAVENOUS
  Filled 2019-10-24: qty 1

## 2019-10-24 NOTE — Progress Notes (Signed)
Physical Therapy Treatment Patient Details Name: [name] [name]. MRN: [id number] DOB: 24-Sep-1933 Today's Date: 10/24/2019    History of Present Illness [place] Chelf. is a 86 y.o. male with medical history significant of CVA, CAD, GERD, glaucoma, hyperlipidemia, insulin-dependent diabetes who presented to the hospital by EMS 10/21/2019 after fall off equipment while at the gym and a resultant intertrochanteric right hip fracture for which he was admitted to the hospital. Underwent intramedullary nailing of R femur on 10/22/2019 and is now WBAT on that LE. Further medical history includes memory loss, osteoporosis, ACLR.    PT Comments    Pt was supine in bed with spouse at bedside. He continues to be alert but pleasantly confused. He is able to follow commands throughout session. When asked about pain, pt points to R calf. Unable to rate pain but facial grimace and sharp pain with movements. He was able to sit on L side of bed with mod assist. Stood 3 x then ambulated to doorway with RW + gait belt. Poor gait posture and required mod assist once fatigued. Therapist recommends +2 assist for any mobility away from edge of bed for safety. He was able to tolerate and demonstrates improved strength in RLE. PT will continue to follow pt per POC and progress pt as able. He will continue to benefit from SNF at D/C to assist pt in returning to PLOF. He is supine in bed with bed alarm set and spouse at bedside.    Follow Up Recommendations  SNF     Equipment Recommendations  Rolling walker with 5" wheels;3in1 (PT)    Recommendations for Other Services       Precautions / Restrictions Precautions Precautions: Fall Restrictions Weight Bearing Restrictions: Yes RLE Weight Bearing: Weight bearing as tolerated    Mobility  Bed Mobility Overal bed mobility: Needs Assistance Bed Mobility: Supine to Sit;Sit to Supine     Supine to sit: HOB elevated;Mod assist Sit to supine: Mod assist;HOB  elevated   General bed mobility comments: Increased time to perform. pt exited L side of bed. He required increased time 2/2 to sharp pain with movements however resolve with sitting EOB.  Transfers Overall transfer level: Needs assistance Equipment used: Rolling walker (2 wheeled) Transfers: Sit to/from Stand Sit to Stand: From elevated surface;Min assist         General transfer comment: Pt performed STS EOB 3 x throughout session from elevated bed height. Vcs for handplacement and technique.   Ambulation/Gait Ambulation/Gait assistance: Min assist;Mod assist Gait Distance (Feet): 30 Feet   Gait Pattern/deviations: Trunk flexed;Step-to pattern;Decreased stance time - right;Decreased weight shift to right Gait velocity: decreased   General Gait Details: Pt was able to ambulate from EOB to doorway and return with RW + min assist. once fatigued he required mod assist and max vcs for upright gait posture. He fatigued quickly but with seated rest recovers quickly with vitals stable throughout.   Stairs             Wheelchair Mobility    Modified Rankin (Stroke Patients Only)       Balance Overall balance assessment: History of Falls;Needs assistance Sitting-balance support: Feet supported Sitting balance-Leahy Scale: Fair Sitting balance - Comments: pt sat EOB x several minutes prior to transfers. NO LOB noted but occasional CGA- min A required 2/2 to pt leaning L due to pain on R hip   Standing balance support: Bilateral upper extremity supported Standing balance-Leahy Scale: Fair Standing balance comment: pt  stood with BUE support without LOB but required constant vcs for posture correction                             Cognition Arousal/Alertness: Awake/alert Behavior During Therapy: WFL for tasks assessed/performed Overall Cognitive Status: (Per spouse, basline cognition deficits since CVA) Area of Impairment: Orientation;Safety/judgement;Awareness                  Orientation Level: Disoriented to;Place;Time;Situation             General Comments: Per pt's spouse, pt has had slight cognitive impairments since previous CVA but she states not anyewhere as bad as deficits are currently.      Exercises General Exercises - Lower Extremity Ankle Circles/Pumps: AROM;20 reps;Supine Quad Sets: AROM;10 reps;Right Heel Slides: AROM;10 reps;Right Hip ABduction/ADduction: AROM;Right;10 reps;Supine Straight Leg Raises: AAROM;Right;10 reps;Supine(through minimal ROM)    General Comments        Pertinent Vitals/Pain Pain Assessment: Faces Faces Pain Scale: Hurts little more Pain Location: RLE Pain Descriptors / Indicators: Guarding;Stabbing Pain Intervention(s): Monitored during session;Limited activity within patient's tolerance    Home Living                      Prior Function            PT Goals (current goals can now be found in the care plan section) Acute Rehab PT Goals Patient Stated Goal: I want to be moving again. Progress towards PT goals: Progressing toward goals    Frequency    BID      PT Plan Current plan remains appropriate    Co-evaluation              AM-PAC PT "6 Clicks" Mobility   Outcome Measure  Help needed turning from your back to your side while in a flat bed without using bedrails?: A Little Help needed moving from lying on your back to sitting on the side of a flat bed without using bedrails?: A Lot Help needed moving to and from a bed to a chair (including a wheelchair)?: A Lot Help needed standing up from a chair using your arms (e.g., wheelchair or bedside chair)?: A Little Help needed to walk in hospital room?: A Little Help needed climbing 3-5 steps with a railing? : A Lot 6 Click Score: 15    End of Session Equipment Utilized During Treatment: Gait belt Activity Tolerance: Patient tolerated treatment well Patient left: in bed;with call bell/phone within  reach;with bed alarm set;with SCD's reapplied Nurse Communication: Mobility status PT Visit Diagnosis: Unsteadiness on feet (R26.81);History of falling (Z91.81);Difficulty in walking, not elsewhere classified (R26.2);Pain;Muscle weakness (generalized) (M62.81) Pain - Right/Left: Right Pain - part of body: Hip     Time: 1425-1444 PT Time Calculation (min) (ACUTE ONLY): 19 min  Charges:  $Gait Training: 8-22 mins                     Jetta Lout PTA 10/24/19, 3:04 PM

## 2019-10-24 NOTE — Care Management Important Message (Signed)
Important Message  Patient Details  Name: [name] [name]. MRN: [id number] Date of Birth: 02-10-1934   Medicare Important Message Given:  Yes     Olegario Messier A Seith Aikey 10/24/2019, 11:16 AM

## 2019-10-24 NOTE — Progress Notes (Signed)
  Subjective: 2 Days Post-Op Procedure(s) (LRB): INTRAMEDULLARY (IM) NAIL INTERTROCHANTRIC (Right) Patient reports pain as mild.  Much improved with minimal confusion. Patient is well, and has had no acute complaints or problems Plan is to go Skilled nursing facility after hospital stay. Negative for chest pain and shortness of breath Fever: no Gastrointestinal:negative for nausea and vomiting  Objective: Vital signs in last 24 hours: Temp:  [97.7 F (36.5 C)-100.3 F (37.9 C)] 100.3 F (37.9 C) (02/19 0757) Pulse Rate:  [75-87] 79 (02/19 0757) Resp:  [18-22] 18 (02/19 0757) BP: (136-151)/(54-84) 151/57 (02/19 0757) SpO2:  [94 %-99 %] 94 % (02/19 0757)  Intake/Output from previous day:  Intake/Output Summary (Last 24 hours) at 10/24/2019 0846 Last data filed at 10/23/2019 1855 Gross per 24 hour  Intake 240 ml  Output 200 ml  Net 40 ml    Intake/Output this shift: No intake/output data recorded.  Labs: Recent Labs    10/21/19 1733 10/22/19 0510 10/23/19 0628 10/24/19 0550  HGB 14.8 14.5 12.5* 12.4*   Recent Labs    10/23/19 0628 10/24/19 0550  WBC 11.5* 10.4  RBC 4.12* 4.01*  HCT 36.4* 35.6*  PLT 156 164   Recent Labs    10/23/19 0628 10/24/19 0550  NA 134* 130*  K 4.2 3.9  CL 104 98  CO2 25 25  BUN 16 17  CREATININE 0.67 0.73  GLUCOSE 174* 203*  CALCIUM 8.6* 8.6*   Recent Labs    10/21/19 1735 10/22/19 0510  INR 1.0 1.1     EXAM General - Patient is alert and oriented to self.  Confused about day. Extremity - Neurovascular intact Compartment soft Dressing/Incision - clean, dry, no drainage Motor Function - intact, moving foot and toes well on exam.  Stood with physical therapy  Past Medical History:  Diagnosis Date  . CAD (coronary artery disease)    non-obstructive by cath in 2000 - Myoview in 2008 negative. EF 57% - ETT in 8/10. Walked 9:17 on [place] w no CPor ST-T changes  . CAD (coronary artery disease)   . Diabetes mellitus   .  GERD (gastroesophageal reflux disease)   . Glaucoma   . Hyperlipidemia   . Iron deficiency anemia   . Memory loss   . Osteoporosis     Assessment/Plan: 2 Days Post-Op Procedure(s) (LRB): INTRAMEDULLARY (IM) NAIL INTERTROCHANTRIC (Right) Active Problems:   Hip fracture (HCC)  Estimated body mass index is 23.05 kg/m as calculated from the following:   Height as of this encounter: 6' (1.829 m).   Weight as of this encounter: 77.1 kg. Advance diet Up with therapy D/C IV fluids Discharge to SNF versus home when cleared by medicine. Follow up at [facility] in 2 weeiks for staple removal and X-rays.  DVT Prophylaxis - Lovenox, Foot Pumps and TED hose Weight-Bearing as tolerated to right leg  Dedra Skeens, PA-C Orthopaedic Surgery 10/24/2019, 8:46 AM

## 2019-10-24 NOTE — Progress Notes (Signed)
Physical Therapy Treatment Patient Details Name: [name] [name]. MRN: [id number] DOB: Jul 31, 1934 Today's Date: 10/24/2019    History of Present Illness [name] [name]. is a 86 y.o. male with medical history significant of CVA, CAD, GERD, glaucoma, hyperlipidemia, insulin-dependent diabetes who presented to the hospital by EMS 10/21/2019 after fall off equipment while at the gym and a resultant intertrochanteric right hip fracture for which he was admitted to the hospital. Underwent intramedullary nailing of R femur on 10/22/2019 and is now WBAT on that LE. Further medical history includes memory loss, osteoporosis, ACLR.    PT Comments    Pt was supine in bed upon arriving with wife at bedside. He is alert throughout but confused. Increased time for all task desired. Inconsistent with following commands. Pt unable to rate RLE pain but present with all movements and exercises. Increased time to perform bed exercises (listed below). He was able to exit L side of bed with increased time + Max assist of 1+ max vcs for technique and sequencing. He stood EOB 3 x  1-2 minutes from elevated bed height + Min assist. Pt puts very limited wt on RLE in standing 2/2 to pain. Poor standing posture but able to correct with cues. He was repositioned supine post session with bed alarm set, call bell in reach, spouse at bedside, and SCds reapplied. PT will return later this date per POC and continue to progress pt as able. Lengthy discussion with spouse/pt about safe d/c disposition. Pt's spouse in agreement to d/c to SNF when medically ready.     Follow Up Recommendations  SNF     Equipment Recommendations  Rolling walker with 5" wheels;3in1 (PT)    Recommendations for Other Services       Precautions / Restrictions Precautions Precautions: Fall Restrictions Weight Bearing Restrictions: Yes RLE Weight Bearing: Weight bearing as tolerated    Mobility  Bed Mobility Overal bed mobility: Needs  Assistance Bed Mobility: Supine to Sit;Sit to Supine Rolling: Mod assist   Supine to sit: Max assist Sit to supine: Max assist   General bed mobility comments: Pt was able to to exit Left side of bed with max assist of one. INcreased time to perform and vcs throughout for technique, sequencing, and safety.  Transfers Overall transfer level: Needs assistance Equipment used: Rolling walker (2 wheeled) Transfers: Sit to/from Stand Sit to Stand: Min assist;From elevated surface         General transfer comment: Pt was able to stand 3 x EOB prior to requesting to sit and return to supine. He required min assist + max vcs for technique and safety. stood ~ 1-2 minutes each trial. Unable to tolerate wt on RLE. quarding throughout.  Ambulation/Gait             General Gait Details: Unsafe to progress at this time   Stairs             Wheelchair Mobility    Modified Rankin (Stroke Patients Only)       Balance                                            Cognition Arousal/Alertness: Awake/alert Behavior During Therapy: WFL for tasks assessed/performed Overall Cognitive Status: Impaired/Different from baseline Area of Impairment: Orientation;Safety/judgement;Awareness  Orientation Level: Disoriented to;Place;Time;Situation             General Comments: Pt was A throughout session but confused. inconsistant with follwoing commands but pleasant.      Exercises General Exercises - Lower Extremity Ankle Circles/Pumps: AROM;20 reps;Supine Quad Sets: AROM;AAROM;10 reps;Supine;Right Heel Slides: AAROM;5 reps;Right;Supine Hip ABduction/ADduction: AAROM;Right;5 reps;Supine Straight Leg Raises: AAROM;Right;5 reps;Supine    General Comments        Pertinent Vitals/Pain Pain Assessment: Faces Pain Score: 7  Faces Pain Scale: Hurts little more Pain Location: RLE Pain Descriptors / Indicators:  Heaviness;Tender;Constant;Guarding Pain Intervention(s): Limited activity within patient's tolerance;Premedicated before session;Repositioned    Home Living                      Prior Function            PT Goals (current goals can now be found in the care plan section) Acute Rehab PT Goals Patient Stated Goal: I want to be moving again. Progress towards PT goals: Progressing toward goals    Frequency    BID      PT Plan Current plan remains appropriate    Co-evaluation              AM-PAC PT "6 Clicks" Mobility   Outcome Measure  Help needed turning from your back to your side while in a flat bed without using bedrails?: A Lot Help needed moving from lying on your back to sitting on the side of a flat bed without using bedrails?: A Lot Help needed moving to and from a bed to a chair (including a wheelchair)?: Total Help needed standing up from a chair using your arms (e.g., wheelchair or bedside chair)?: A Little Help needed to walk in hospital room?: Total Help needed climbing 3-5 steps with a railing? : Total 6 Click Score: 10    End of Session Equipment Utilized During Treatment: Gait belt Activity Tolerance: Patient tolerated treatment well Patient left: in bed;with call bell/phone within reach;with bed alarm set;with SCD's reapplied Nurse Communication: Mobility status PT Visit Diagnosis: Unsteadiness on feet (R26.81);History of falling (Z91.81);Difficulty in walking, not elsewhere classified (R26.2);Pain;Muscle weakness (generalized) (M62.81) Pain - Right/Left: Right Pain - part of body: Hip     Time: 0947-0962 PT Time Calculation (min) (ACUTE ONLY): 29 min  Charges:  $Therapeutic Exercise: 8-22 mins $Therapeutic Activity: 8-22 mins                    Jetta Lout PTA 10/24/19, 12:32 PM

## 2019-10-24 NOTE — Progress Notes (Signed)
OT Cancellation Note  Patient Details Name: [name] [name]. MRN: [id number] DOB: Dec 30, 1933   Cancelled Treatment:    Reason Eval/Treat Not Completed: Fatigue/lethargy limiting ability to participate;Other (comment)   Patient approached in afternoon this date.  ~3:40pm. Patient supine in bed stating "I just stopped hurting and I don't want to start".  Wife at bedside, agreeing patient should not participate at this time. Will follow up as able.  Wynn Maudlin 10/24/2019, 4:11 PM

## 2019-10-24 NOTE — Plan of Care (Signed)
No acute events over night. He is alert only to self. Patient has been intermittently confused throughout the night. However, he is easily reoriented. Reorientation provided as needed. His vitals have been stable. He was on 2L of oxygen via nasal cannula; however, this was discontinued as his oxygen saturation was 99%. He has been maintaining an oxygen saturation of 95% or greater on room air. He's been adherent to his medication regimen. Blood glucose monitored and supplemental insulin administered per sliding scale orders. He complained of right hip/leg pain and received one dose of prn Methocarbamol; medication effective per patient report. Dressing to right hip remains clean, dry, and intact. Repositioning completed to prevent skin breakdown. Safety precautions in place. Bed alarm in use. Patient free of falls and injuries. Plan of care continued.    Problem: Pain Managment: Goal: General experience of comfort will improve 10/24/2019 0417 by Romilda Joy, RN Outcome: Progressing 10/24/2019 0139 by Romilda Joy, RN Outcome: Progressing   Problem: Safety: Goal: Ability to remain free from injury will improve 10/24/2019 0417 by Romilda Joy, RN Outcome: Progressing   Problem: Skin Integrity: Goal: Risk for impaired skin integrity will decrease 10/24/2019 0417 by Romilda Joy, RN Outcome: Progressing   Problem: Education: Goal: Knowledge of General Education information will improve Description: Including pain rating scale, medication(s)/side effects and non-pharmacologic comfort measures 10/24/2019 0417 by Romilda Joy, RN Outcome: Progressing   Problem: Health Behavior/Discharge Planning: Goal: Ability to manage health-related needs will improve 10/24/2019 0417 by Romilda Joy, RN Outcome: Progressing   Problem: Clinical Measurements: Goal: Ability to maintain clinical measurements within normal limits will improve 10/24/2019 0417 by Romilda Joy,  RN Outcome: Progressing  Goal: Will remain free from infection 10/24/2019 0417 by Romilda Joy, RN Outcome: Progressing  Goal: Diagnostic test results will improve 10/24/2019 0417 by Romilda Joy, RN Outcome: Progressing  Goal: Respiratory complications will improve 10/24/2019 0417 by Romilda Joy, RN Outcome: Progressing  Goal: Cardiovascular complication will be avoided 10/24/2019 0417 by Romilda Joy, RN Outcome: Progressing    Problem: Activity: Goal: Risk for activity intolerance will decrease 10/24/2019 0417 by Romilda Joy, RN Outcome: Progressing    Problem: Nutrition: Goal: Adequate nutrition will be maintained 10/24/2019 0417 by Romilda Joy, RN Outcome: Progressing    Problem: Coping: Goal: Level of anxiety will decrease 10/24/2019 0417 by Romilda Joy, RN Outcome: Progressing    Problem: Elimination: Goal: Will not experience complications related to bowel motility 10/24/2019 0417 by Romilda Joy, RN Outcome: Progressing  Goal: Will not experience complications related to urinary retention 10/24/2019 0417 by Romilda Joy, RN Outcome: Progressing    Problem: Pain Managment: Goal: General experience of comfort will improve 10/24/2019 0417 by Romilda Joy, RN Outcome: Progressing   Problem: Safety: Goal: Ability to remain free from injury will improve 10/24/2019 0417 by Romilda Joy, RN Outcome: Progressing   Problem: Skin Integrity: Goal: Risk for impaired skin integrity will decrease 10/24/2019 0417 by Romilda Joy, RN Outcome: Progressing   Problem: Education: Goal: Verbalization of understanding the information provided (i.e., activity precautions, restrictions, etc) will improve 10/24/2019 0417 by Romilda Joy, RN Outcome: Progressing  Goal: Individualized Educational Video(s) 10/24/2019 0417 by Romilda Joy, RN Outcome: Progressing   Problem: Activity: Goal: Ability to  ambulate and perform ADLs will improve 10/24/2019 0417 by Romilda Joy, RN Outcome: Progressing   Problem: Clinical Measurements: Goal: Postoperative complications will be avoided or minimized  10/24/2019 0417 by Miles Costain, RN Outcome: Progressing   Problem: Self-Concept: Goal: Ability to maintain and perform role responsibilities to the fullest extent possible will improve 10/24/2019 0417 by Miles Costain, RN Outcome: Progressing   Problem: Pain Management: Goal: Pain level will decrease 10/24/2019 0417 by Miles Costain, RN Outcome: Progressing

## 2019-10-24 NOTE — TOC Progression Note (Signed)
Transition of Care (TOC) - Progression Note    Patient Details  Name: [name] [name]. MRN: [id number] Date of Birth: 29-Dec-1933  Transition of Care [facility]) CM/SW Contact  Margarito Liner, LCSW Phone Number: 10/24/2019, 4:31 PM  Clinical Narrative: Spoke with patient and his wife about rehab. Wife agreeable to SNF. She asked CSW to call her daughter, Bonita Quin to discuss preferences. CSW spoke to daughter Bonita Quin. First preference is [facility]. Patient has had both COVID vaccines. They are not in network with his insurance. Second preference is [facility]. Left message for admissions coordinator asking her to review referral. CSW called and updated daughter.  Expected Discharge Plan: Home w Home Health Services Barriers to Discharge: Continued Medical Work up  Expected Discharge Plan and Services Expected Discharge Plan: Home w Home Health Services In-house Referral: Clinical Social Work     Living arrangements for the past 2 months: Single Family Home                                       Social Determinants of Health (SDOH) Interventions    Readmission Risk Interventions No flowsheet data found.

## 2019-10-24 NOTE — Progress Notes (Signed)
PROGRESS NOTE    [name] [name].  ZOX:[id number] DOB: 06-10-1934 DOA: 10/21/2019 PCP: Danella Penton, MD    Assessment & Plan:   Active Problems:   Hip fracture (HCC)    [name] [name]. is a 86 y.o. Caucasian male with medical history significant of CVA, CAD, GERD, glaucoma, hyperlipidemia, insulin-dependent diabetes who presents for evaluation after slip and fall while at the gym and a resultant intertrochanteric right hip fracture.  The patient denies any prodromal syncopal events.  Denies any loss of consciousness.  Denies any head trauma.   Intertrochanteric hip fracture, right S/p INTRAMEDULLARY (IM) NAIL INTERTROCHANTRIC with Dr. Allena Katz No syncopal event, secondary to mechanical fall Plan: --pain control with tylenol or low-dose oxy --PT/OT  --WBAT on the operative extremity. Lovenox 40mg /day x 4 weeks --Follow up at [facility] in 2 weeiks for staple removal and X-rays.  Hospital delirium, improved Mild dementia at baseline  Insulin-dependent diabetes Patient is on [facility] flex touch 32 units at bedtime Also on Metformin 1000 mg daily Also on glimepiride Plan: Hold oral agents Lantus 10 units every afternoon Moderate sliding scale Carb modified diet when diet advanced  Coronary artery disease Continue daily aspirin Sublingual nitroglycerin  Hyperlipidemia Continue simvastatin  Depression Restless leg Sertraline 50 mg nightly Pramipexole 0.125 mg nightly  GERD Protonix 40 mg daily  History of CVA Daily ASA   DVT prophylaxis: Lovenox SQ Code Status: Full code  Family Communication: Wife updated at the bedside Disposition Plan: SNF rehab whenever bed available   Subjective and Interval History:  Pt noted to be more calm overnight and today.  Sitter d/c'ed.  Pt complained of some pain and received tylenol.  No fever, N/V/D.  After wife watched PT work with pt, she decided against taking pt home.  SNF rehab search  initiated.    Objective: Vitals:   10/23/19 1558 10/23/19 2351 10/24/19 0757 10/24/19 1645  BP: 137/84 (!) 149/54 (!) 151/57 (!) 144/73  Pulse: 84 75 79 75  Resp: 20 18 18 18   Temp: 99.5 F (37.5 C) 99.6 F (37.6 C) 100.3 F (37.9 C) 99 F (37.2 C)  TempSrc: Oral Oral Oral Oral  SpO2: 99% 95% 94% 96%  Weight:      Height:        Intake/Output Summary (Last 24 hours) at 10/24/2019 1848 Last data filed at 10/24/2019 1816 Gross per 24 hour  Intake 240 ml  Output 200 ml  Net 40 ml   Filed Weights   10/21/19 1603 10/21/19 2251 10/22/19 1237  Weight: 77.1 kg 77.1 kg 77.1 kg    Examination:   Constitutional: NAD, alert, oriented, pleasant HEENT: conjunctivae and lids normal, EOMI CV: RRR no M,R,G. Distal pulses +2.  No cyanosis.   RESP: CTA B/L, normal respiratory effort  GI: +BS, NTND Extremities: No effusions, edema in BLE SKIN: warm, dry and intact Neuro: II - XII grossly intact.  Sensation intact Psych: Normal mood and affect.  Appropriate judgement and reason   Data Reviewed: I have personally reviewed following labs and imaging studies  CBC: Recent Labs  Lab 10/21/19 1733 10/22/19 0510 10/23/19 0628 10/24/19 0550  WBC 12.1* 12.6* 11.5* 10.4  NEUTROABS  --  9.1*  --   --   HGB 14.8 14.5 12.5* 12.4*  HCT 42.6 41.6 36.4* 35.6*  MCV 88.0 87.4 88.3 88.8  PLT 178 186 156 164   Basic Metabolic Panel: Recent Labs  Lab 10/21/19 1733 10/22/19 0510 10/23/19 10/24/19 10/24/19 0550  NA 136 133* 134* 130*  K 4.0 4.0 4.2 3.9  CL 103 99 104 98  CO2 27 26 25 25   GLUCOSE 122* 208* 174* 203*  BUN 17 17 16 17   CREATININE 0.74 0.67 0.67 0.73  CALCIUM 9.6 9.2 8.6* 8.6*  MG  --   --  2.1 2.2   GFR: Estimated Creatinine Clearance: 76.3 mL/min (by C-G formula based on SCr of 0.73 mg/dL). Liver Function Tests: No results for input(s): AST, ALT, ALKPHOS, BILITOT, PROT, ALBUMIN in the last 168 hours. No results for input(s): LIPASE, AMYLASE in the last 168 hours. No  results for input(s): AMMONIA in the last 168 hours. Coagulation Profile: Recent Labs  Lab 10/21/19 1735 10/22/19 0510  INR 1.0 1.1   Cardiac Enzymes: No results for input(s): CKTOTAL, CKMB, CKMBINDEX, TROPONINI in the last 168 hours. BNP (last 3 results) No results for input(s): PROBNP in the last 8760 hours. HbA1C: Recent Labs    10/21/19 2324  HGBA1C 9.0*   CBG: Recent Labs  Lab 10/23/19 1636 10/23/19 2123 10/24/19 0755 10/24/19 1141 10/24/19 1644  GLUCAP 212* 250* 181* 214* 208*   Lipid Profile: No results for input(s): CHOL, HDL, LDLCALC, TRIG, CHOLHDL, LDLDIRECT in the last 72 hours. Thyroid Function Tests: No results for input(s): TSH, T4TOTAL, FREET4, T3FREE, THYROIDAB in the last 72 hours. Anemia Panel: No results for input(s): VITAMINB12, FOLATE, FERRITIN, TIBC, IRON, RETICCTPCT in the last 72 hours. Sepsis Labs: No results for input(s): PROCALCITON, LATICACIDVEN in the last 168 hours.  Recent Results (from the past 240 hour(s))  Respiratory Panel by RT PCR (Flu A&B, Covid) - Nasopharyngeal Swab     Status: None   Collection Time: 10/21/19  5:35 PM   Specimen: Nasopharyngeal Swab  Result Value Ref Range Status   SARS Coronavirus 2 by RT PCR NEGATIVE NEGATIVE Final    Comment: (NOTE) SARS-CoV-2 target nucleic acids are NOT DETECTED. The SARS-CoV-2 RNA is generally detectable in upper respiratoy specimens during the acute phase of infection. The lowest concentration of SARS-CoV-2 viral copies this assay can detect is 131 copies/mL. A negative result does not preclude SARS-Cov-2 infection and should not be used as the sole basis for treatment or other patient management decisions. A negative result may occur with  improper specimen collection/handling, submission of specimen other than nasopharyngeal swab, presence of viral mutation(s) within the areas targeted by this assay, and inadequate number of viral copies (<131 copies/mL). A negative result must be  combined with clinical observations, patient history, and epidemiological information. The expected result is Negative. Fact Sheet for Patients:  PinkCheek.be Fact Sheet for Healthcare Providers:  GravelBags.it This test is not yet ap proved or cleared by the [place] FDA and  has been authorized for detection and/or diagnosis of SARS-CoV-2 by FDA under an Emergency Use Authorization (EUA). This EUA will remain  in effect (meaning this test can be used) for the duration of the COVID-19 declaration under Section 564(b)(1) of the Act, 21 U.S.C. section 360bbb-3(b)(1), unless the authorization is terminated or revoked sooner.    Influenza A by PCR NEGATIVE NEGATIVE Final   Influenza B by PCR NEGATIVE NEGATIVE Final    Comment: (NOTE) The Xpert Xpress SARS-CoV-2/FLU/RSV assay is intended as an aid in  the diagnosis of influenza from Nasopharyngeal swab specimens and  should not be used as a sole basis for treatment. Nasal washings and  aspirates are unacceptable for Xpert Xpress SARS-CoV-2/FLU/RSV  testing. Fact Sheet for Patients: PinkCheek.be Fact Sheet for Healthcare  Providers: https://www.young.biz/ This test is not yet approved or cleared by the [place] and  has been authorized for detection and/or diagnosis of SARS-CoV-2 by  FDA under an Emergency Use Authorization (EUA). This EUA will remain  in effect (meaning this test can be used) for the duration of the  Covid-19 declaration under Section 564(b)(1) of the Act, 21  U.S.C. section 360bbb-3(b)(1), unless the authorization is  terminated or revoked. Performed at [facility], [address] [place], [place] [address]   MRSA PCR Screening     Status: None   Collection Time: 10/22/19  4:53 AM   Specimen: Nasal Mucosa; Nasopharyngeal  Result Value Ref Range Status   MRSA by PCR NEGATIVE NEGATIVE  Final    Comment:        The GeneXpert MRSA Assay (FDA approved for NASAL specimens only), is one component of a comprehensive MRSA colonization surveillance program. It is not intended to diagnose MRSA infection nor to guide or monitor treatment for MRSA infections. Performed at [facility], [address] [place], [place] [address]       Radiology Studies: No results found.   Scheduled Meds: . aspirin EC  81 mg Oral Daily  . calcium-vitamin D  1 tablet Oral Q0600  . docusate sodium  100 mg Oral BID  . enoxaparin (LOVENOX) injection  40 mg Subcutaneous Q24H  . insulin aspart  0-15 Units Subcutaneous TID WC  . insulin aspart  0-5 Units Subcutaneous QHS  . insulin detemir  10 Units Subcutaneous QHS  . latanoprost  1 drop Left Eye QHS  . multivitamin with minerals  1 tablet Oral Daily  . pantoprazole  40 mg Oral Daily  . pramipexole  0.125 mg Oral QHS  . Ensure Max Protein  11 oz Oral BID BM  . sertraline  50 mg Oral QHS  . simvastatin  40 mg Oral QHS   Continuous Infusions: . sodium chloride    . methocarbamol (ROBAXIN) IV       LOS: 3 days     Darlin Priestly, MD [facility] If 7PM-7AM, please contact night-coverage 10/24/2019, 6:48 PM

## 2019-10-24 NOTE — NC FL2 (Signed)
[place] LEVEL OF CARE SCREENING TOOL     IDENTIFICATION  Patient Name: [name] [name]. Birthdate: 1933-10-17 Sex: male Admission Date (Current Location): 10/21/2019  [place] and [place] Number:  Engineering geologist and Address:  [facility], [address], [place], [place] [address]      Provider Number: [id number]  Attending Physician Name and Address:  Enzo Bi, MD  Relative Name and Phone Number:  Euclide Granito [phone]    Current Level of Care: Hospital Recommended Level of Care: [place] Prior Approval Number:    Date Approved/Denied:   PASRR Number: [id number]  Discharge Plan: SNF    Current Diagnoses: Patient Active Problem List   Diagnosis Date Noted  . Hip fracture ([place]) 10/21/2019  . Weakness 06/07/2017  . PAD (peripheral artery disease) ([place]) 09/07/2014  . Diabetes mellitus ([place]) 09/21/2011  . Hyperlipidemia 03/25/2009  . CAD, NATIVE VESSEL 03/25/2009  . CHEST PAIN-PRECORDIAL 03/25/2009    Orientation RESPIRATION BLADDER Height & Weight     Self, Situation, Place  Normal Continent Weight: 169 lb 15.6 oz (77.1 kg) Height:  6' (182.9 cm)  BEHAVIORAL SYMPTOMS/MOOD NEUROLOGICAL BOWEL NUTRITION STATUS  (None) (None) Continent Diet(Carb modified)  AMBULATORY STATUS COMMUNICATION OF NEEDS Skin   Limited Assist Verbally Skin abrasions, Surgical wounds                       Personal Care Assistance Level of Assistance  Bathing, Feeding, Dressing Bathing Assistance: Limited assistance Feeding assistance: Limited assistance Dressing Assistance: Limited assistance     Functional Limitations Info  Sight, Hearing, Speech Sight Info: Adequate Hearing Info: Adequate Speech Info: Adequate    SPECIAL CARE FACTORS FREQUENCY  PT (By licensed PT), OT (By licensed OT)     PT Frequency: 5 x week OT Frequency: 5 x week            Contractures Contractures Info: Not present     Additional Factors Info  Code Status, Allergies Code Status Info: Full code Allergies Info: Morphine and related, Chlorhexidine, Hydrocodone-acetaminophen           Current Medications (10/24/2019):  This is the current hospital active medication list Current Facility-Administered Medications  Medication Dose Route Frequency Provider Last Rate Last Admin  . 0.9 %  sodium chloride infusion   Intravenous PRN Enzo Bi, MD      . acetaminophen (TYLENOL) tablet 1,000 mg  1,000 mg Oral TID PRN Enzo Bi, MD   1,000 mg at 10/24/19 1339  . artificial tears (LACRILUBE) ophthalmic ointment   Both Eyes Q3H PRN Enzo Bi, MD      . aspirin EC tablet 81 mg  81 mg Oral Daily Enzo Bi, MD   81 mg at 10/24/19 0837  . bisacodyl (DULCOLAX) suppository 10 mg  10 mg Rectal Daily PRN Leim Fabry, MD   10 mg at 10/24/19 1538  . calcium-vitamin D (OSCAL WITH D) 500-200 MG-UNIT per tablet 1 tablet  1 tablet Oral [id number] Leim Fabry, MD   1 tablet at 10/24/19 0525  . docusate sodium (COLACE) capsule 100 mg  100 mg Oral BID Leim Fabry, MD   100 mg at 10/24/19 0837  . enoxaparin (LOVENOX) injection 40 mg  40 mg Subcutaneous Q24H Leim Fabry, MD   40 mg at 10/24/19 0836  . HYDROmorphone (DILAUDID) injection 0.25-0.5 mg  0.25-0.5 mg Intravenous Q2H PRN Leim Fabry, MD   0.5 mg at 10/23/19 0455  .  insulin aspart (novoLOG) injection 0-15 Units  0-15 Units Subcutaneous TID WC Signa Kell, MD   5 Units at 10/24/19 1156  . insulin aspart (novoLOG) injection 0-5 Units  0-5 Units Subcutaneous QHS Signa Kell, MD   2 Units at 10/23/19 2146  . insulin detemir (LEVEMIR) injection 10 Units  10 Units Subcutaneous QHS Signa Kell, MD   10 Units at 10/23/19 2239  . latanoprost (XALATAN) 0.005 % ophthalmic solution 1 drop  1 drop Left Eye Deirdre Evener, MD   1 drop at 10/23/19 2241  . methocarbamol (ROBAXIN) tablet 500 mg  500 mg Oral Q6H PRN Signa Kell, MD   500 mg at 10/23/19 2144   Or  . methocarbamol (ROBAXIN)  500 mg in dextrose 5 % 50 mL IVPB  500 mg Intravenous Q6H PRN Signa Kell, MD      . metoCLOPramide (REGLAN) tablet 5-10 mg  5-10 mg Oral Q8H PRN Signa Kell, MD       Or  . metoCLOPramide (REGLAN) injection 5-10 mg  5-10 mg Intravenous Q8H PRN Signa Kell, MD      . multivitamin with minerals tablet 1 tablet  1 tablet Oral Daily Darlin Priestly, MD   1 tablet at 10/24/19 0837  . nitroGLYCERIN (NITROSTAT) SL tablet 0.4 mg  0.4 mg Sublingual Q5 min PRN Signa Kell, MD      . ondansetron [facility]) tablet 4 mg  4 mg Oral Q6H PRN Signa Kell, MD       Or  . ondansetron [facility]) injection 4 mg  4 mg Intravenous Q6H PRN Signa Kell, MD      . oxyCODONE (Oxy IR/ROXICODONE) immediate release tablet 2.5-5 mg  2.5-5 mg Oral Q3H PRN Signa Kell, MD      . oxyCODONE (Oxy IR/ROXICODONE) immediate release tablet 5-10 mg  5-10 mg Oral Q4H PRN Signa Kell, MD   10 mg at 10/23/19 0113  . pantoprazole (PROTONIX) EC tablet 40 mg  40 mg Oral Daily Signa Kell, MD   40 mg at 10/24/19 0837  . pramipexole (MIRAPEX) tablet 0.125 mg  0.125 mg Oral QHS Signa Kell, MD   0.125 mg at 10/23/19 2239  . protein supplement (ENSURE MAX) liquid  11 oz Oral BID BM Darlin Priestly, MD   11 oz at 10/24/19 0835  . senna-docusate (Senokot-S) tablet 1 tablet  1 tablet Oral QHS PRN Signa Kell, MD      . sertraline (ZOLOFT) tablet 50 mg  50 mg Oral QHS Signa Kell, MD   50 mg at 10/23/19 2144  . simvastatin (ZOCOR) tablet 40 mg  40 mg Oral QHS Signa Kell, MD   40 mg at 10/23/19 2145  . sodium phosphate (FLEET) 7-19 GM/118ML enema 1 enema  1 enema Rectal Once PRN Signa Kell, MD      . traMADol Janean Sark) tablet 50 mg  50 mg Oral Q6H PRN Signa Kell, MD   50 mg at 10/22/19 1831     Discharge Medications: Please see discharge summary for a list of discharge medications.  Relevant Imaging Results:  Relevant Lab Results:   Additional Information SS#: [id number]  Margarito Liner, LCSW

## 2019-10-25 LAB — BASIC METABOLIC PANEL
Anion gap: 7 (ref 5–15)
BUN: 13 mg/dL (ref 8–23)
CO2: 25 mmol/L (ref 22–32)
Calcium: 8.9 mg/dL (ref 8.9–10.3)
Chloride: 102 mmol/L (ref 98–111)
Creatinine, Ser: 0.67 mg/dL (ref 0.61–1.24)
GFR calc Af Amer: >60 mL/min (ref >60)
GFR calc non Af Amer: >60 mL/min (ref >60)
Glucose, Bld: 177 mg/dL — ABNORMAL HIGH (ref 70–99)
Potassium: 3.8 mmol/L (ref 3.5–5.1)
Sodium: 134 mmol/L — ABNORMAL LOW (ref 135–145)

## 2019-10-25 LAB — MAGNESIUM: Magnesium: 2 mg/dL (ref 1.7–2.4)

## 2019-10-25 LAB — CBC
HCT: 34.8 % — ABNORMAL LOW (ref 39.0–52.0)
Hemoglobin: 12.1 g/dL — ABNORMAL LOW (ref 13.0–17.0)
MCH: 30.5 pg (ref 26.0–34.0)
MCHC: 34.8 g/dL (ref 30.0–36.0)
MCV: 87.7 fL (ref 80.0–100.0)
Platelets: 164 10*3/uL (ref 150–400)
RBC: 3.97 MIL/uL — ABNORMAL LOW (ref 4.22–5.81)
RDW: 12.6 % (ref 11.5–15.5)
WBC: 8.8 10*3/uL (ref 4.0–10.5)
nRBC: 0 % (ref 0.0–0.2)

## 2019-10-25 LAB — GLUCOSE, CAPILLARY
Glucose-Capillary: 132 mg/dL — ABNORMAL HIGH (ref 70–99)
Glucose-Capillary: 193 mg/dL — ABNORMAL HIGH (ref 70–99)
Glucose-Capillary: 278 mg/dL — ABNORMAL HIGH (ref 70–99)
Glucose-Capillary: 156 mg/dL — ABNORMAL HIGH (ref 70–99)

## 2019-10-25 MED ORDER — QUETIAPINE FUMARATE 25 MG PO TABS
25.0000 mg | ORAL_TABLET | Freq: Every day | ORAL | Status: DC
  Filled 2019-10-25: qty 1

## 2019-10-25 NOTE — Progress Notes (Signed)
Subjective: 3 Days Post-Op Procedure(s) (LRB): INTRAMEDULLARY (IM) NAIL INTERTROCHANTRIC (Right) Patient does not appear to be in significant pain, not able to obtain a verbal pain score. Patient is well, and has had no acute complaints or problems Plan is to go Skilled nursing facility after hospital stay. Negative for chest pain and shortness of breath Fever: no Gastrointestinal:negative for nausea and vomiting  Objective: Vital signs in last 24 hours: Temp:  [98.2 F (36.8 C)-99 F (37.2 C)] 98.2 F (36.8 C) (02/20 0807) Pulse Rate:  [75-84] 80 (02/20 0807) Resp:  [18] 18 (02/20 0807) BP: (136-160)/(58-75) 160/58 (02/20 0807) SpO2:  [95 %-97 %] 97 % (02/20 0807)  Intake/Output from previous day:  Intake/Output Summary (Last 24 hours) at 10/25/2019 1111 Last data filed at 10/25/2019 0640 Gross per 24 hour  Intake 240 ml  Output 1200 ml  Net -960 ml    Intake/Output this shift: No intake/output data recorded.  Labs: Recent Labs    10/23/19 0628 10/24/19 0550 10/25/19 1015  HGB 12.5* 12.4* 12.1*   Recent Labs    10/24/19 0550 10/25/19 1015  WBC 10.4 8.8  RBC 4.01* 3.97*  HCT 35.6* 34.8*  PLT 164 164   Recent Labs    10/24/19 0550 10/25/19 1015  NA 130* 134*  K 3.9 3.8  CL 98 102  CO2 25 25  BUN 17 13  CREATININE 0.73 0.67  GLUCOSE 203* 177*  CALCIUM 8.6* 8.9   No results for input(s): LABPT, INR in the last 72 hours.   EXAM General - Patient is alert and is able to answer simple questions about hip. Extremity - Neurovascular intact Compartment soft.  Moderate ecchymosis to the right leg this morning. Dressing/Incision - clean, dry, no drainage Motor Function - intact, moving foot and toes well on exam.   Past Medical History:  Diagnosis Date  . CAD (coronary artery disease)    non-obstructive by cath in 2000 - Myoview in 2008 negative. EF 57% - ETT in 8/10. Walked 9:17 on [place] w no CPor ST-T changes  . CAD (coronary artery disease)   .  Diabetes mellitus   . GERD (gastroesophageal reflux disease)   . Glaucoma   . Hyperlipidemia   . Iron deficiency anemia   . Memory loss   . Osteoporosis     Assessment/Plan: 3 Days Post-Op Procedure(s) (LRB): INTRAMEDULLARY (IM) NAIL INTERTROCHANTRIC (Right) Active Problems:   Hip fracture (HCC)  Estimated body mass index is 23.05 kg/m as calculated from the following:   Height as of this encounter: 6' (1.829 m).   Weight as of this encounter: 77.1 kg. Advance diet Up with therapy Discharge to SNF versus home when cleared by medicine. Follow up at [facility] in 2 weeiks for staple removal and X-rays. Continue Lovenox 40mg  daily for 14 days following discharge.  DVT Prophylaxis - Lovenox, Foot Pumps and TED hose Weight-Bearing as tolerated to right leg  J. , PA-C Orthopaedic Surgery 10/25/2019, 11:11 AM

## 2019-10-25 NOTE — Progress Notes (Signed)
Physical Therapy Treatment Patient Details Name: [name] [name]. MRN: [id number] DOB: Sep 03, 1934 Today's Date: 10/25/2019    History of Present Illness Sarkis Rhines. is a 86 y.o. male with medical history significant of CVA, CAD, GERD, glaucoma, hyperlipidemia, insulin-dependent diabetes who presented to the hospital by EMS 10/21/2019 after fall off equipment while at the gym and a resultant intertrochanteric right hip fracture for which he was admitted to the hospital. Underwent intramedullary nailing of R femur on 10/22/2019 and is now WBAT on that LE. Further medical history includes memory loss, osteoporosis, ACLR.    PT Comments    Pt was supine in bed upon arriving. He is awake and greeted therapist upon entry however continues to be disoriented and confused. Pt was able to follow one step commands with increased time and verbal + tactile cues. Pt's spouse at bedside upset about meds issued previous night to calm pt. Therapist explained safety concerns of pt trying to get OOB independently. She states understanding. Pt was able to exit L side of bed with mod assist + increased time. Unable to rate pain however pt demonstrates facial expressions of pain. He sat EOB x several minutes prior to standing and ambulating to doorway and return with RW + min-mod assist. Gait belt used for safety throughout. Once returned to bed, pt was able to perform 2 set of exercise prior to falling asleep mid exercise. PT will continue to follow per POC and continues to recommend D/C to SNF when able.    Follow Up Recommendations  SNF     Equipment Recommendations  Rolling walker with 5" wheels;3in1 (PT)    Recommendations for Other Services       Precautions / Restrictions Precautions Precautions: Fall Restrictions Weight Bearing Restrictions: Yes RLE Weight Bearing: Weight bearing as tolerated    Mobility  Bed Mobility Overal bed mobility: Needs Assistance Bed Mobility: Supine to Sit;Sit to  Supine     Supine to sit: HOB elevated;Mod assist Sit to supine: Mod assist;HOB elevated   General bed mobility comments: Pt was able to exit L side of bed with increasedtime and verbal and tactile cues. He tolerated well but does present with pain with movements. Therapist supported RLE and assist with trunk control. Sat EOB x several minutes prior to transferring.  Transfers Overall transfer level: Needs assistance Equipment used: Rolling walker (2 wheeled) Transfers: Sit to/from Stand Sit to Stand: From elevated surface;Min assist;Mod assist         General transfer comment: Pt was able to stand EOB 2 x with min-mod assist. He continues to present with guarding RLE and was cued to increase wt on RLE as tolerated. Prior to ambulation pt stood x 2 minutes with vital stable.   Ambulation/Gait Ambulation/Gait assistance: Min assist;Mod assist Gait Distance (Feet): 30 Feet Assistive device: Rolling walker (2 wheeled) Gait Pattern/deviations: Step-to pattern;Decreased stance time - right;Decreased step length - right;Antalgic;Trunk flexed Gait velocity: decreased   General Gait Details: Pt was able to ambulate to doorway and return with min-mod assist of one. Vcs throughout for posture correction and improved gait sequencing. He tolerated gait well but fatigued quickly.   Stairs             Wheelchair Mobility    Modified Rankin (Stroke Patients Only)       Balance Overall balance assessment: History of Falls;Needs assistance Sitting-balance support: Feet supported Sitting balance-Leahy Scale: Fair Sitting balance - Comments: pt sat EOB x 3 minutes prior to transfers.  NO LOB however does lean L to keep wt off R hip   Standing balance support: Bilateral upper extremity supported Standing balance-Leahy Scale: Fair Standing balance comment: no LOB in standing with UE support however limits wt on RLE.                            Cognition Arousal/Alertness:  Lethargic Behavior During Therapy: WFL for tasks assessed/performed Overall Cognitive Status: Impaired/Different from baseline Area of Impairment: Orientation;Safety/judgement;Awareness                 Orientation Level: Disoriented to;Place;Time;Situation       Safety/Judgement: Decreased awareness of safety;Decreased awareness of deficits     General Comments: Pt was awake upon entering room but is drowsy/lethargic. He is talkative but disoriented. Pt was able to follow one step commands. Per spouse" they gave him two different meds last night and hes been sleepy all day." therapist discussed safety concerns with pt attempting to get up I'ly and why medication was used. she states understanding but still unhappy.      Exercises General Exercises - Lower Extremity Ankle Circles/Pumps: AROM;10 reps;Supine Heel Slides: AROM;10 reps;Right(pt unable to stay awake after exercise)    General Comments        Pertinent Vitals/Pain Pain Assessment: Faces Faces Pain Scale: Hurts even more Pain Location: no pain at rest; when moving, patient grimaces and cries out but unable to rate pain; Pain Descriptors / Indicators: Guarding;Stabbing Pain Intervention(s): Monitored during session;Repositioned;Limited activity within patient's tolerance    Home Living                      Prior Function            PT Goals (current goals can now be found in the care plan section) Acute Rehab PT Goals Patient Stated Goal: unable to state Progress towards PT goals: Progressing toward goals    Frequency    BID      PT Plan Current plan remains appropriate    Co-evaluation              AM-PAC PT "6 Clicks" Mobility   Outcome Measure  Help needed turning from your back to your side while in a flat bed without using bedrails?: A Lot Help needed moving from lying on your back to sitting on the side of a flat bed without using bedrails?: A Lot Help needed moving to and  from a bed to a chair (including a wheelchair)?: A Lot Help needed standing up from a chair using your arms (e.g., wheelchair or bedside chair)?: A Lot Help needed to walk in hospital room?: A Lot Help needed climbing 3-5 steps with a railing? : A Lot 6 Click Score: 12    End of Session Equipment Utilized During Treatment: Gait belt Activity Tolerance: Patient tolerated treatment well;Patient limited by lethargy Patient left: in bed;with call bell/phone within reach;with bed alarm set;with family/visitor present;with SCD's reapplied Nurse Communication: Mobility status PT Visit Diagnosis: Unsteadiness on feet (R26.81);History of falling (Z91.81);Difficulty in walking, not elsewhere classified (R26.2);Pain;Muscle weakness (generalized) (M62.81) Pain - Right/Left: Right Pain - part of body: Hip     Time: 2706-2376 PT Time Calculation (min) (ACUTE ONLY): 21 min  Charges:  $Gait Training: 8-22 mins                     Jetta Lout PTA 10/25/19, 4:21 PM

## 2019-10-25 NOTE — Progress Notes (Signed)
PROGRESS NOTE    [name] [name].  YTK:[id number] DOB: 1934-03-12 DOA: 10/21/2019 PCP: Rusty Aus, MD    Assessment & Plan:   Active Problems:   Hip fracture ([place])    [name] [name]. is a 86 y.o. Caucasian male with medical history significant of CVA, CAD, GERD, glaucoma, hyperlipidemia, insulin-dependent diabetes who presents for evaluation after slip and fall while at the gym and a resultant intertrochanteric right hip fracture.  The patient denies any prodromal syncopal events.  Denies any loss of consciousness.  Denies any head trauma.   Intertrochanteric hip fracture, right S/p INTRAMEDULLARY (IM) NAIL INTERTROCHANTRIC with Dr. Posey Pronto No syncopal event, secondary to mechanical fall Plan: --pain control with tylenol or low-dose oxy --PT/OT  --WBAT on the operative extremity. Lovenox 40mg /day x 4 weeks --Follow up at [facility] in 2 weeiks for staple removal and X-rays.  Hospital delirium Mild dementia at baseline --Schedule seroquel nightly  Insulin-dependent diabetes Patient is on [place] flex touch 32 units at bedtime Also on Metformin 1000 mg daily Also on glimepiride Plan: Hold oral agents Lantus 10 units every afternoon Moderate sliding scale Carb modified diet when diet advanced  Coronary artery disease Continue daily aspirin Sublingual nitroglycerin  Hyperlipidemia Continue simvastatin  Depression Restless leg Sertraline 50 mg nightly Pramipexole 0.125 mg nightly  GERD Protonix 40 mg daily  History of CVA Daily ASA   DVT prophylaxis: Lovenox SQ Code Status: Full code  Family Communication: Wife updated at the bedside Disposition Plan: SNF rehab whenever bed available   Subjective and Interval History:  Pt was agitated again last night, and given IV ativan and seroquel, which put him to sleep.  Pt was more groggy this morning, but reported doing fair.  No fever, dyspnea, chest pain, abdominal pain, N/V/D,  dysuria.   Objective: Vitals:   10/24/19 1645 10/25/19 0010 10/25/19 0807 10/25/19 1457  BP: (!) 144/73 136/75 (!) 160/58 139/63  Pulse: 75 84 80 87  Resp: 18 18 18 18   Temp: 99 F (37.2 C) 98.2 F (36.8 C) 98.2 F (36.8 C) 98.4 F (36.9 C)  TempSrc: Oral Oral Oral Oral  SpO2: 96% 95% 97% 97%  Weight:      Height:        Intake/Output Summary (Last 24 hours) at 10/25/2019 1527 Last data filed at 10/25/2019 1300 Gross per 24 hour  Intake 340 ml  Output 1200 ml  Net -860 ml   Filed Weights   10/21/19 1603 10/21/19 2251 10/22/19 1237  Weight: 77.1 kg 77.1 kg 77.1 kg    Examination:   Constitutional: NAD, alert, oriented, sleepy today HEENT: conjunctivae and lids normal, EOMI CV: RRR no M,R,G. Distal pulses +2.  No cyanosis.   RESP: CTA B/L, normal respiratory effort  GI: +BS, NTND Extremities: No effusions, edema in BLE SKIN: warm, dry and intact Neuro: II - XII grossly intact.  Sensation intact Psych: Normal mood and affect.     Data Reviewed: I have personally reviewed following labs and imaging studies  CBC: Recent Labs  Lab 10/21/19 1733 10/22/19 0510 10/23/19 0628 10/24/19 0550 10/25/19 1015  WBC 12.1* 12.6* 11.5* 10.4 8.8  NEUTROABS  --  9.1*  --   --   --   HGB 14.8 14.5 12.5* 12.4* 12.1*  HCT 42.6 41.6 36.4* 35.6* 34.8*  MCV 88.0 87.4 88.3 88.8 87.7  PLT 178 186 156 164 751   Basic Metabolic Panel: Recent Labs  Lab 10/21/19 1733 10/22/19 0510 10/23/19 7001  10/24/19 0550 10/25/19 1015  NA 136 133* 134* 130* 134*  K 4.0 4.0 4.2 3.9 3.8  CL 103 99 104 98 102  CO2 27 26 25 25 25   GLUCOSE 122* 208* 174* 203* 177*  BUN 17 17 16 17 13   CREATININE 0.74 0.67 0.67 0.73 0.67  CALCIUM 9.6 9.2 8.6* 8.6* 8.9  MG  --   --  2.1 2.2 2.0   GFR: Estimated Creatinine Clearance: 76.3 mL/min (by C-G formula based on SCr of 0.67 mg/dL). Liver Function Tests: No results for input(s): AST, ALT, ALKPHOS, BILITOT, PROT, ALBUMIN in the last 168 hours. No  results for input(s): LIPASE, AMYLASE in the last 168 hours. No results for input(s): AMMONIA in the last 168 hours. Coagulation Profile: Recent Labs  Lab 10/21/19 1735 10/22/19 0510  INR 1.0 1.1   Cardiac Enzymes: No results for input(s): CKTOTAL, CKMB, CKMBINDEX, TROPONINI in the last 168 hours. BNP (last 3 results) No results for input(s): PROBNP in the last 8760 hours. HbA1C: No results for input(s): HGBA1C in the last 72 hours. CBG: Recent Labs  Lab 10/24/19 0755 10/24/19 1141 10/24/19 1644 10/25/19 0915 10/25/19 1146  GLUCAP 181* 214* 208* 132* 193*   Lipid Profile: No results for input(s): CHOL, HDL, LDLCALC, TRIG, CHOLHDL, LDLDIRECT in the last 72 hours. Thyroid Function Tests: No results for input(s): TSH, T4TOTAL, FREET4, T3FREE, THYROIDAB in the last 72 hours. Anemia Panel: No results for input(s): VITAMINB12, FOLATE, FERRITIN, TIBC, IRON, RETICCTPCT in the last 72 hours. Sepsis Labs: No results for input(s): PROCALCITON, LATICACIDVEN in the last 168 hours.  Recent Results (from the past 240 hour(s))  Respiratory Panel by RT PCR (Flu A&B, Covid) - Nasopharyngeal Swab     Status: None   Collection Time: 10/21/19  5:35 PM   Specimen: Nasopharyngeal Swab  Result Value Ref Range Status   SARS Coronavirus 2 by RT PCR NEGATIVE NEGATIVE Final    Comment: (NOTE) SARS-CoV-2 target nucleic acids are NOT DETECTED. The SARS-CoV-2 RNA is generally detectable in upper respiratoy specimens during the acute phase of infection. The lowest concentration of SARS-CoV-2 viral copies this assay can detect is 131 copies/mL. A negative result does not preclude SARS-Cov-2 infection and should not be used as the sole basis for treatment or other patient management decisions. A negative result may occur with  improper specimen collection/handling, submission of specimen other than nasopharyngeal swab, presence of viral mutation(s) within the areas targeted by this assay, and  inadequate number of viral copies (<131 copies/mL). A negative result must be combined with clinical observations, patient history, and epidemiological information. The expected result is Negative. Fact Sheet for Patients:  10/27/19 Fact Sheet for Healthcare Providers:  10/23/19 This test is not yet ap proved or cleared by the https://www.moore.com/ FDA and  has been authorized for detection and/or diagnosis of SARS-CoV-2 by FDA under an Emergency Use Authorization (EUA). This EUA will remain  in effect (meaning this test can be used) for the duration of the COVID-19 declaration under Section 564(b)(1) of the Act, 21 U.S.C. section 360bbb-3(b)(1), unless the authorization is terminated or revoked sooner.    Influenza A by PCR NEGATIVE NEGATIVE Final   Influenza B by PCR NEGATIVE NEGATIVE Final    Comment: (NOTE) The Xpert Xpress SARS-CoV-2/FLU/RSV assay is intended as an aid in  the diagnosis of influenza from Nasopharyngeal swab specimens and  should not be used as a sole basis for treatment. Nasal washings and  aspirates are unacceptable for Xpert Xpress  SARS-CoV-2/FLU/RSV  testing. Fact Sheet for Patients: https://www.moore.com/ Fact Sheet for Healthcare Providers: https://www.young.biz/ This test is not yet approved or cleared by the [place] FDA and  has been authorized for detection and/or diagnosis of SARS-CoV-2 by  FDA under an Emergency Use Authorization (EUA). This EUA will remain  in effect (meaning this test can be used) for the duration of the  Covid-19 declaration under Section 564(b)(1) of the Act, 21  U.S.C. section 360bbb-3(b)(1), unless the authorization is  terminated or revoked. Performed at [facility], [address] [place], [place] [address]   MRSA PCR Screening     Status: None   Collection Time: 10/22/19  4:53 AM   Specimen: Nasal Mucosa;  Nasopharyngeal  Result Value Ref Range Status   MRSA by PCR NEGATIVE NEGATIVE Final    Comment:        The GeneXpert MRSA Assay (FDA approved for NASAL specimens only), is one component of a comprehensive MRSA colonization surveillance program. It is not intended to diagnose MRSA infection nor to guide or monitor treatment for MRSA infections. Performed at [facility], [address] [place], [place] [address]       Radiology Studies: No results found.   Scheduled Meds: . aspirin EC  81 mg Oral Daily  . calcium-vitamin D  1 tablet Oral Q0600  . docusate sodium  100 mg Oral BID  . enoxaparin (LOVENOX) injection  40 mg Subcutaneous Q24H  . insulin aspart  0-15 Units Subcutaneous TID WC  . insulin aspart  0-5 Units Subcutaneous QHS  . insulin detemir  10 Units Subcutaneous QHS  . latanoprost  1 drop Left Eye QHS  . multivitamin with minerals  1 tablet Oral Daily  . pantoprazole  40 mg Oral Daily  . pramipexole  0.125 mg Oral QHS  . Ensure Max Protein  11 oz Oral BID BM  . sertraline  50 mg Oral QHS  . simvastatin  40 mg Oral QHS   Continuous Infusions: . sodium chloride    . methocarbamol (ROBAXIN) IV       LOS: 4 days     Darlin Priestly, MD [facility] If 7PM-7AM, please contact night-coverage 10/25/2019, 3:27 PM

## 2019-10-25 NOTE — Progress Notes (Addendum)
Daughter was approved by charge nurse to come and stay with patient overnight due to continued confusion and agitation. Wife does not want patient to get "a lot of meds" to "sedate him".  I advised her that the Dr ordered Seroquel at bedtime which can be sedative and help relax him and she acknowledged that she understood and that would be ok for him to take.

## 2019-10-25 NOTE — Progress Notes (Signed)
Physical Therapy Treatment Patient Details Name: [name] [name]. MRN: [id number] DOB: 28-Apr-1934 Today's Date: 10/25/2019    History of Present Illness [name] [name]. is a 86 y.o. male with medical history significant of CVA, CAD, GERD, glaucoma, hyperlipidemia, insulin-dependent diabetes who presented to the hospital by EMS 10/21/2019 after fall off equipment while at the gym and a resultant intertrochanteric right hip fracture for which he was admitted to the hospital. Underwent intramedullary nailing of R femur on 10/22/2019 and is now WBAT on that LE. Further medical history includes memory loss, osteoporosis, ACLR.    PT Comments    Patient very lethargic this session. Per nursing, he was sedated last night due to being combative and restless. He was instructed in LE exercise in bed and kept falling asleep after 2-3 repetitions requiring reorientation to task. Patient unsafe to attempt sitting edge of bed or transfers at this time due to lethargy. He did require max A +2 for rolling in bed while changing sheets. Patient hesitant to roll to right side due to increased pain. He was dependent for scooting up in bed. Will re-attempt treatment again this afternoon as lethargy subsides. He would benefit from additional skilled PT intervention for strengthening, transfers and gait training.    Follow Up Recommendations  SNF     Equipment Recommendations  Rolling walker with 5" wheels;3in1 (PT)    Recommendations for Other Services       Precautions / Restrictions Precautions Precautions: Fall Restrictions Weight Bearing Restrictions: Yes RLE Weight Bearing: Weight bearing as tolerated    Mobility  Bed Mobility Overal bed mobility: Needs Assistance Bed Mobility: Rolling Rolling: Max assist;+2 for physical assistance         General bed mobility comments: Patient very lethargic secondary to medications; He requires max A +2 for rolling with max VCs for hand placement and  positioning; He was dependent to scoot up in bed;  Transfers                 General transfer comment: unsafe to attempt at this time;  Ambulation/Gait             General Gait Details: unsafe to attempt at this time;   Stairs             Wheelchair Mobility    Modified Rankin (Stroke Patients Only)       Balance                                            Cognition Arousal/Alertness: Lethargic Behavior During Therapy: WFL for tasks assessed/performed Overall Cognitive Status: (Per spouse, basline cognition deficits since CVA) Area of Impairment: Orientation;Safety/judgement;Awareness                 Orientation Level: Disoriented to;Place;Time;Situation             General Comments: Per pt's spouse, pt has had slight cognitive impairments since previous CVA but she states not anyewhere as bad as deficits are currently.      Exercises Other Exercises Other Exercises: Patient supine in bed: instructed patient in LE exercise: ankle pumps x20 reps, SAQ x10 reps, Heel slides x10 reps all bilaterally ;patient very lethargic and often falling asleep after 2-3 repetitions requiring max VCs for reorient to task and instruction. He also required AAROM on RLE for better ROM. patient cries out in  pain on RLE with all movement. He was premedicated prior to session however nursing only provided tylenol due to sedation.    General Comments        Pertinent Vitals/Pain Pain Assessment: No/denies pain Pain Score: 0-No pain Pain Location: no pain at rest; when moving, patient grimaces and cries out but unable to rate pain; Pain Intervention(s): Monitored during session;Premedicated before session;Repositioned    Home Living                      Prior Function            PT Goals (current goals can now be found in the care plan section) Acute Rehab PT Goals Patient Stated Goal: I want to be moving again. PT Goal  Formulation: With patient Time For Goal Achievement: 11/06/19 Progress towards PT goals: Not progressing toward goals - comment(pt very lethargic secondary to medication sedation;)    Frequency    BID      PT Plan Current plan remains appropriate    Co-evaluation              AM-PAC PT "6 Clicks" Mobility   Outcome Measure  Help needed turning from your back to your side while in a flat bed without using bedrails?: A Lot Help needed moving from lying on your back to sitting on the side of a flat bed without using bedrails?: A Lot Help needed moving to and from a bed to a chair (including a wheelchair)?: A Lot Help needed standing up from a chair using your arms (e.g., wheelchair or bedside chair)?: A Lot Help needed to walk in hospital room?: A Lot Help needed climbing 3-5 steps with a railing? : A Lot 6 Click Score: 12    End of Session Equipment Utilized During Treatment: Gait belt Activity Tolerance: Patient limited by fatigue;Patient limited by lethargy Patient left: in bed;with call bell/phone within reach;with bed alarm set;with SCD's reapplied Nurse Communication: Mobility status PT Visit Diagnosis: Unsteadiness on feet (R26.81);History of falling (Z91.81);Difficulty in walking, not elsewhere classified (R26.2);Pain;Muscle weakness (generalized) (M62.81) Pain - Right/Left: Right Pain - part of body: Hip     Time: 2440-1027 PT Time Calculation (min) (ACUTE ONLY): 32 min  Charges:  $Therapeutic Exercise: 8-22 mins $Therapeutic Activity: 8-22 mins                        Renarda Mullinix PT, DPT 10/25/2019, 11:47 AM

## 2019-10-26 LAB — GLUCOSE, CAPILLARY
Glucose-Capillary: 165 mg/dL — ABNORMAL HIGH (ref 70–99)
Glucose-Capillary: 213 mg/dL — ABNORMAL HIGH (ref 70–99)
Glucose-Capillary: 181 mg/dL — ABNORMAL HIGH (ref 70–99)
Glucose-Capillary: 258 mg/dL — ABNORMAL HIGH (ref 70–99)

## 2019-10-26 LAB — BASIC METABOLIC PANEL
Anion gap: 6 (ref 5–15)
BUN: 11 mg/dL (ref 8–23)
CO2: 28 mmol/L (ref 22–32)
Calcium: 8.9 mg/dL (ref 8.9–10.3)
Chloride: 100 mmol/L (ref 98–111)
Creatinine, Ser: 0.63 mg/dL (ref 0.61–1.24)
GFR calc Af Amer: >60 mL/min (ref >60)
GFR calc non Af Amer: >60 mL/min (ref >60)
Glucose, Bld: 179 mg/dL — ABNORMAL HIGH (ref 70–99)
Potassium: 3.9 mmol/L (ref 3.5–5.1)
Sodium: 134 mmol/L — ABNORMAL LOW (ref 135–145)

## 2019-10-26 LAB — URINALYSIS, COMPLETE (UACMP) WITH MICROSCOPIC
Bilirubin Urine: NEGATIVE
Glucose, UA: 50 mg/dL — AB
Hgb urine dipstick: NEGATIVE
Ketones, ur: 5 mg/dL — AB
Leukocytes,Ua: NEGATIVE
Nitrite: NEGATIVE
Protein, ur: NEGATIVE mg/dL
Specific Gravity, Urine: 1.017 (ref 1.005–1.030)
Squamous Epithelial / HPF: NONE SEEN (ref 0–5)
pH: 7 (ref 5.0–8.0)

## 2019-10-26 LAB — CBC
HCT: 34.7 % — ABNORMAL LOW (ref 39.0–52.0)
Hemoglobin: 11.9 g/dL — ABNORMAL LOW (ref 13.0–17.0)
MCH: 30.4 pg (ref 26.0–34.0)
MCHC: 34.3 g/dL (ref 30.0–36.0)
MCV: 88.5 fL (ref 80.0–100.0)
Platelets: 178 10*3/uL (ref 150–400)
RBC: 3.92 MIL/uL — ABNORMAL LOW (ref 4.22–5.81)
RDW: 12.7 % (ref 11.5–15.5)
WBC: 7.6 10*3/uL (ref 4.0–10.5)
nRBC: 0 % (ref 0.0–0.2)

## 2019-10-26 LAB — MAGNESIUM: Magnesium: 2.1 mg/dL (ref 1.7–2.4)

## 2019-10-26 MED ORDER — POLYETHYLENE GLYCOL 3350 17 G PO PACK: 34.0000 g | PACK | ORAL | Status: DC

## 2019-10-26 MED ORDER — OXYCODONE HCL 5 MG PO TABS: 2.5000 mg | ORAL_TABLET | Freq: Four times a day (QID) | ORAL | Status: DC | PRN

## 2019-10-26 MED ORDER — POLYETHYLENE GLYCOL 3350 17 G PO PACK
17.0000 g | PACK | Freq: Two times a day (BID) | ORAL | Status: DC
  Administered 2019-10-27 – 2019-10-28 (×3): 17 g via ORAL
  Filled 2019-10-26 (×4): qty 1

## 2019-10-26 MED ORDER — QUETIAPINE FUMARATE 25 MG PO TABS
25.0000 mg | ORAL_TABLET | Freq: Every day | ORAL | Status: DC
  Administered 2019-10-26: 22:00:00 25 mg via ORAL
  Filled 2019-10-26 (×2): qty 1

## 2019-10-26 MED ORDER — GALANTAMINE HYDROBROMIDE 4 MG PO TABS
12.0000 mg | ORAL_TABLET | Freq: Two times a day (BID) | ORAL | Status: DC
  Administered 2019-10-26 – 2019-10-28 (×4): 12 mg via ORAL
  Filled 2019-10-26 (×5): qty 3

## 2019-10-26 NOTE — Progress Notes (Signed)
Physical Therapy Treatment Patient Details Name: [name] [name]. MRN: [id number] DOB: [birth year] Today's Date: 10/26/2019    History of Present Illness [name] [name]. is a 86 y.o. male with medical history significant of CVA, CAD, GERD, glaucoma, hyperlipidemia, insulin-dependent diabetes who presented to the hospital by EMS 10/21/2019 after fall off equipment while at the gym and a resultant intertrochanteric right hip fracture for which he was admitted to the hospital. Underwent intramedullary nailing of R femur on 10/22/2019 and is now WBAT on that LE. Further medical history includes memory loss, osteoporosis, ACLR.    PT Comments    Pt in bed.  More alert today ready for session.  Wife in room.  Participated in exercises as described below. To edge of bed with increased time and rails with min a x 1.  Once sitting, he is initially unsteady and requires min/mod a x 1 but is able to progress to close supervision over 15 minutes.  Stood x 1 with RW and mod a x 1.  Pt generally unsteady with post lean.  Attempted marching in place but he is unable to do so and begins falling backwards on bed.  Assist to correct but he leans forward excessively and is unable to gain balance.  Pt was encouraged and assisted to sit.  RN called to discuss as wife voices concern over slurred speech.  He does appear to have some R facial droop which wife stated she has not noticed previously.  He does have history of prior CVA in chart.  Overall bed mobility and alertness is however improved today but gait was deferred due to balance and safety.  RN requested he remain in bed for safety so he was given extended time in sitting EOB - 15 minutes.  His T-shirt is wet with perspiration.  Assisted in changing shirt.  He uses both hands well to assist but does seem to have some trouble figuring out how to put it on and needs hands on assist to don shirt and gown.  He is able to lateral scoot in sitting with mod cues and return to  bed with min guard.  Wife in room and bed alarm on for safety.  He quickly falls asleep after session. Secure chat to MD.   Follow Up Recommendations  SNF     Equipment Recommendations  Rolling walker with 5" wheels;3in1 (PT)    Recommendations for Other Services       Precautions / Restrictions Precautions Precautions: Fall Restrictions Weight Bearing Restrictions: Yes RLE Weight Bearing: Weight bearing as tolerated    Mobility  Bed Mobility Overal bed mobility: Needs Assistance Bed Mobility: Supine to Sit;Sit to Supine     Supine to sit: Min assist;Mod assist Sit to supine: Min assist   General bed mobility comments: Exit on R side today, increased time and pt struggles at times but overall improved.  Transfers Overall transfer level: Needs assistance Equipment used: Rolling walker (2 wheeled) Transfers: Sit to/from Stand Sit to Stand: Mod assist;From elevated surface        Lateral/Scoot Transfers: From elevated surface;Min guard General transfer comment: generally unsteady with post lean in sitting and standing  Ambulation/Gait             General Gait Details: deferred due to balance and inability to march in place this morning,.   Stairs             Wheelchair Mobility    Modified Rankin (Stroke Patients Only)  Balance Overall balance assessment: History of Falls;Needs assistance Sitting-balance support: Feet supported Sitting balance-Leahy Scale: Poor Sitting balance - Comments: initially rerquired min a x 1 but progressed to  close supervision.   Standing balance support: Bilateral upper extremity supported Standing balance-Leahy Scale: Poor Standing balance comment: unable to march in place today.  close min a for standing balance statically.                            Cognition Arousal/Alertness: Awake/alert Behavior During Therapy: WFL for tasks assessed/performed Overall Cognitive Status: Impaired/Different  from baseline                                 General Comments: awake during session but falls asleep quickly at end of session.  Contineus to have some difficulty following cues but overall improved.      Exercises General Exercises - Lower Extremity Ankle Circles/Pumps: AROM;20 reps;Supine Quad Sets: AROM;10 reps;Right Heel Slides: AROM;10 reps;Right Hip ABduction/ADduction: AROM;Right;10 reps;Supine Straight Leg Raises: AAROM;Right;10 reps;Supine(through minimal ROM) Other Exercises Other Exercises: sat EOB x 15 minutes with mod to close min assist with time.    General Comments        Pertinent Vitals/Pain Pain Assessment: Faces Faces Pain Scale: Hurts a little bit Pain Location: generally comfortable Pain Descriptors / Indicators: Guarding;Stabbing    Home Living                      Prior Function            PT Goals (current goals can now be found in the care plan section) Progress towards PT goals: Progressing toward goals    Frequency    BID      PT Plan Current plan remains appropriate    Co-evaluation              AM-PAC PT "6 Clicks" Mobility   Outcome Measure  Help needed turning from your back to your side while in a flat bed without using bedrails?: A Little Help needed moving from lying on your back to sitting on the side of a flat bed without using bedrails?: A Lot Help needed moving to and from a bed to a chair (including a wheelchair)?: A Lot Help needed standing up from a chair using your arms (e.g., wheelchair or bedside chair)?: A Lot Help needed to walk in hospital room?: Total Help needed climbing 3-5 steps with a railing? : Total 6 Click Score: 11    End of Session Equipment Utilized During Treatment: Gait belt Activity Tolerance: Patient limited by fatigue Patient left: in bed;with call bell/phone within reach;with bed alarm set;with family/visitor present Nurse Communication: Other (comment) Pain -  Right/Left: Right Pain - part of body: Hip     Time: 0930-1008 PT Time Calculation (min) (ACUTE ONLY): 38 min  Charges:  $Therapeutic Exercise: 8-22 mins $Therapeutic Activity: 23-37 mins                     Danielle Dess, PTA 10/26/19, 10:28 AM

## 2019-10-26 NOTE — TOC Progression Note (Signed)
Transition of Care (TOC) - Progression Note    Patient Details  Name: Shivan Hodes. MRN: [id number] Date of Birth: [birth year]  Transition of [facility]) CM/SW Contact  Maud Deed, LCSW Phone Number:[phone] 10/26/2019, 2:22 PM  Clinical Narrative:    CSW notified pt's daughter Bonita Quin that pt was accepted into [facility]. Daughter prefers [facility] (pendind in [place]). CSW spoke with Verlon Au at [facility] and she stated that she would review the referral. Family also wanted to know what pt would qualify for if he went home and Doctoer notified me that he would get HHPT and DME. CSW spoke with Bonita Quin and notified her of this. Family is weighting their options and would like to consider both SNF and HH.   TOC will continue to follow for discharge planning needs.   Expected Discharge Plan: Home w Home Health Services Barriers to Discharge: Continued Medical Work up  Expected Discharge Plan and Services Expected Discharge Plan: Home w Home Health Services In-house Referral: Clinical Social Work     Living arrangements for the past 2 months: Single Family Home                                       Social Determinants of Health (SDOH) Interventions    Readmission Risk Interventions No flowsheet data found.

## 2019-10-26 NOTE — Progress Notes (Addendum)
PROGRESS NOTE    [name] [name].  RJJ:[id number] DOB: 07/31/1934 DOA: 10/21/2019 PCP: Rusty Aus, MD    Assessment & Plan:   Active Problems:   Hip fracture ([place])    [name] [name]. is a 86 y.o. Caucasian male with medical history significant of CVA, CAD, GERD, glaucoma, hyperlipidemia, insulin-dependent diabetes who presents for evaluation after slip and fall while at the gym and a resultant intertrochanteric right hip fracture.  The patient denies any prodromal syncopal events.  Denies any loss of consciousness.  Denies any head trauma.   Hospital delirium, sun-downing Mild dementia at baseline --Pt gets agitated and confused mostly at night.  Seroquel and/or Ativan worked to calm pt and got him to sleep the other day, but family was upset pt was sleepy the next day and wants to avoid sedatives.  The same daughter who came to stay with pt last night (per special nursing permission) may not be able to come again tonight, but we can't have different family member come each night, per current hospital policy.  We also want to avoid getting a hospital sitter because that will prevent pt from being able to discharge to SNF rehab.   PLAN: --Will go ahead with seroquel 25 mg nightly --Avoid hospital sitter   Intertrochanteric hip fracture, right S/p INTRAMEDULLARY (IM) NAIL INTERTROCHANTRIC with Dr. Posey Pronto No syncopal event, secondary to mechanical fall Plan: --pain control with tylenol  --PT/OT  --WBAT on the operative extremity. Lovenox 40mg /day x 4 weeks --Follow up at [facility] in 2 weeiks for staple removal and X-rays.   Insulin-dependent diabetes Patient is on [place] flex touch 32 units at bedtime Also on Metformin 1000 mg daily Also on glimepiride Plan: Hold oral agents Lantus 10 units every afternoon Moderate sliding scale Carb modified diet when diet advanced  Coronary artery disease Continue daily aspirin Sublingual  nitroglycerin  Hyperlipidemia Continue simvastatin  Depression Restless leg Sertraline 50 mg nightly Pramipexole 0.125 mg nightly  GERD Protonix 40 mg daily  History of CVA Daily ASA  Constipation --Need to have BM prior to discharge to SNF --Miralax 34 g q2h for 5 doses, or until pt has an BM's.     DVT prophylaxis: Lovenox SQ Code Status: Full code  Family Communication: Wife updated at the bedside Disposition Plan: SNF rehab whenever bed available   Subjective and Interval History:  Pt was agitated last night, family refused any sedatives.  Daughter was given permission to come be with pt overnight.  This morning, pt was sleepy, and having more difficulty with PT.  No fever, dyspnea, chest pain, abdominal pain, N/V/D, dysuria.  No focal neurological deficits.   Objective: Vitals:   10/25/19 0807 10/25/19 1457 10/26/19 0456 10/26/19 0745  BP: (!) 160/58 139/63 (!) 169/75 (!) 170/67  Pulse: 80 87 80 78  Resp: 18 18 18 18   Temp: 98.2 F (36.8 C) 98.4 F (36.9 C) 98 F (36.7 C) (!) 97.5 F (36.4 C)  TempSrc: Oral Oral Oral Oral  SpO2: 97% 97% 98% 97%  Weight:      Height:        Intake/Output Summary (Last 24 hours) at 10/26/2019 1343 Last data filed at 10/26/2019 0900 Gross per 24 hour  Intake 100 ml  Output 1000 ml  Net -900 ml   Filed Weights   10/21/19 1603 10/21/19 2251 10/22/19 1237  Weight: 77.1 kg 77.1 kg 77.1 kg    Examination:   Constitutional: NAD, alert, oriented, sleepy  HEENT: conjunctivae and lids normal, EOMI CV: RRR no M,R,G. Distal pulses +2.  No cyanosis.   RESP: CTA B/L, normal respiratory effort  GI: +BS, NTND Extremities: No effusions, edema in BLE SKIN: warm, dry and intact Neuro: II - XII grossly intact.  Sensation intact Psych: Normal mood and affect.     Data Reviewed: I have personally reviewed following labs and imaging studies  CBC: Recent Labs  Lab 10/22/19 0510 10/23/19 0628 10/24/19 0550 10/25/19 1015  10/26/19 0545  WBC 12.6* 11.5* 10.4 8.8 7.6  NEUTROABS 9.1*  --   --   --   --   HGB 14.5 12.5* 12.4* 12.1* 11.9*  HCT 41.6 36.4* 35.6* 34.8* 34.7*  MCV 87.4 88.3 88.8 87.7 88.5  PLT 186 156 164 164 178   Basic Metabolic Panel: Recent Labs  Lab 10/22/19 0510 10/23/19 0628 10/24/19 0550 10/25/19 1015 10/26/19 0545  NA 133* 134* 130* 134* 134*  K 4.0 4.2 3.9 3.8 3.9  CL 99 104 98 102 100  CO2 26 25 25 25 28   GLUCOSE 208* 174* 203* 177* 179*  BUN 17 16 17 13 11   CREATININE 0.67 0.67 0.73 0.67 0.63  CALCIUM 9.2 8.6* 8.6* 8.9 8.9  MG  --  2.1 2.2 2.0 2.1   GFR: Estimated Creatinine Clearance: 76.3 mL/min (by C-G formula based on SCr of 0.63 mg/dL). Liver Function Tests: No results for input(s): AST, ALT, ALKPHOS, BILITOT, PROT, ALBUMIN in the last 168 hours. No results for input(s): LIPASE, AMYLASE in the last 168 hours. No results for input(s): AMMONIA in the last 168 hours. Coagulation Profile: Recent Labs  Lab 10/21/19 1735 10/22/19 0510  INR 1.0 1.1   Cardiac Enzymes: No results for input(s): CKTOTAL, CKMB, CKMBINDEX, TROPONINI in the last 168 hours. BNP (last 3 results) No results for input(s): PROBNP in the last 8760 hours. HbA1C: No results for input(s): HGBA1C in the last 72 hours. CBG: Recent Labs  Lab 10/25/19 1146 10/25/19 1652 10/25/19 2136 10/26/19 0742 10/26/19 1136  GLUCAP 193* 278* 156* 165* 213*   Lipid Profile: No results for input(s): CHOL, HDL, LDLCALC, TRIG, CHOLHDL, LDLDIRECT in the last 72 hours. Thyroid Function Tests: No results for input(s): TSH, T4TOTAL, FREET4, T3FREE, THYROIDAB in the last 72 hours. Anemia Panel: No results for input(s): VITAMINB12, FOLATE, FERRITIN, TIBC, IRON, RETICCTPCT in the last 72 hours. Sepsis Labs: No results for input(s): PROCALCITON, LATICACIDVEN in the last 168 hours.  Recent Results (from the past 240 hour(s))  Respiratory Panel by RT PCR (Flu A&B, Covid) - Nasopharyngeal Swab     Status: None    Collection Time: 10/21/19  5:35 PM   Specimen: Nasopharyngeal Swab  Result Value Ref Range Status   SARS Coronavirus 2 by RT PCR NEGATIVE NEGATIVE Final    Comment: (NOTE) SARS-CoV-2 target nucleic acids are NOT DETECTED. The SARS-CoV-2 RNA is generally detectable in upper respiratoy specimens during the acute phase of infection. The lowest concentration of SARS-CoV-2 viral copies this assay can detect is 131 copies/mL. A negative result does not preclude SARS-Cov-2 infection and should not be used as the sole basis for treatment or other patient management decisions. A negative result may occur with  improper specimen collection/handling, submission of specimen other than nasopharyngeal swab, presence of viral mutation(s) within the areas targeted by this assay, and inadequate number of viral copies (<131 copies/mL). A negative result must be combined with clinical observations, patient history, and epidemiological information. The expected result is Negative. Fact Sheet for  Patients:  https://www.moore.com/ Fact Sheet for Healthcare Providers:  https://www.young.biz/ This test is not yet ap proved or cleared by the [place] and  has been authorized for detection and/or diagnosis of SARS-CoV-2 by FDA under an Emergency Use Authorization (EUA). This EUA will remain  in effect (meaning this test can be used) for the duration of the COVID-19 declaration under Section 564(b)(1) of the Act, 21 U.S.C. section 360bbb-3(b)(1), unless the authorization is terminated or revoked sooner.    Influenza A by PCR NEGATIVE NEGATIVE Final   Influenza B by PCR NEGATIVE NEGATIVE Final    Comment: (NOTE) The Xpert Xpress SARS-CoV-2/FLU/RSV assay is intended as an aid in  the diagnosis of influenza from Nasopharyngeal swab specimens and  should not be used as a sole basis for treatment. Nasal washings and  aspirates are unacceptable for Xpert Xpress  SARS-CoV-2/FLU/RSV  testing. Fact Sheet for Patients: https://www.moore.com/ Fact Sheet for Healthcare Providers: https://www.young.biz/ This test is not yet approved or cleared by the [place] FDA and  has been authorized for detection and/or diagnosis of SARS-CoV-2 by  FDA under an Emergency Use Authorization (EUA). This EUA will remain  in effect (meaning this test can be used) for the duration of the  Covid-19 declaration under Section 564(b)(1) of the Act, 21  U.S.C. section 360bbb-3(b)(1), unless the authorization is  terminated or revoked. Performed at [facility], [address] [place], [place] [address]   MRSA PCR Screening     Status: None   Collection Time: 10/22/19  4:53 AM   Specimen: Nasal Mucosa; Nasopharyngeal  Result Value Ref Range Status   MRSA by PCR NEGATIVE NEGATIVE Final    Comment:        The GeneXpert MRSA Assay (FDA approved for NASAL specimens only), is one component of a comprehensive MRSA colonization surveillance program. It is not intended to diagnose MRSA infection nor to guide or monitor treatment for MRSA infections. Performed at [facility], [address] [place], [place] [address]       Radiology Studies: No results found.   Scheduled Meds: . aspirin EC  81 mg Oral Daily  . calcium-vitamin D  1 tablet Oral Q0600  . docusate sodium  100 mg Oral BID  . enoxaparin (LOVENOX) injection  40 mg Subcutaneous Q24H  . insulin aspart  0-15 Units Subcutaneous TID WC  . insulin aspart  0-5 Units Subcutaneous QHS  . insulin detemir  10 Units Subcutaneous QHS  . latanoprost  1 drop Left Eye QHS  . multivitamin with minerals  1 tablet Oral Daily  . pantoprazole  40 mg Oral Daily  . pramipexole  0.125 mg Oral QHS  . Ensure Max Protein  11 oz Oral BID BM  . QUEtiapine  25 mg Oral QHS  . sertraline  50 mg Oral QHS  . simvastatin  40 mg Oral QHS   Continuous Infusions: .  sodium chloride    . methocarbamol (ROBAXIN) IV       LOS: 5 days     Darlin Priestly, MD [facility] If 7PM-7AM, please contact night-coverage 10/26/2019, 1:43 PM

## 2019-10-26 NOTE — Progress Notes (Signed)
Subjective: 4 Days Post-Op Procedure(s) (LRB): INTRAMEDULLARY (IM) NAIL INTERTROCHANTRIC (Right) Patient denies significant pain today.  Confusion and responsiveness is much improved this AM Patient is well, and has had no acute complaints or problems Plan is to go Skilled nursing facility after hospital stay. Negative for chest pain and shortness of breath Fever: no Gastrointestinal:negative for nausea and vomiting  Objective: Vital signs in last 24 hours: Temp:  [97.5 F (36.4 C)-98.4 F (36.9 C)] 97.5 F (36.4 C) (02/21 0745) Pulse Rate:  [78-87] 78 (02/21 0745) Resp:  [18] 18 (02/21 0745) BP: (139-170)/(63-75) 170/67 (02/21 0745) SpO2:  [97 %-98 %] 97 % (02/21 0745)  Intake/Output from previous day:  Intake/Output Summary (Last 24 hours) at 10/26/2019 0858 Last data filed at 10/26/2019 0530 Gross per 24 hour  Intake 100 ml  Output 1000 ml  Net -900 ml    Intake/Output this shift: No intake/output data recorded.  Labs: Recent Labs    10/24/19 0550 10/25/19 1015 10/26/19 0545  HGB 12.4* 12.1* 11.9*   Recent Labs    10/25/19 1015 10/26/19 0545  WBC 8.8 7.6  RBC 3.97* 3.92*  HCT 34.8* 34.7*  PLT 164 178   Recent Labs    10/25/19 1015 10/26/19 0545  NA 134* 134*  K 3.8 3.9  CL 102 100  CO2 25 28  BUN 13 11  CREATININE 0.67 0.63  GLUCOSE 177* 179*  CALCIUM 8.9 8.9   No results for input(s): LABPT, INR in the last 72 hours.   EXAM General - Patient is more alert this morning when compared to yesterday. Extremity - Neurovascular intact Compartment soft.  Moderate ecchymosis to the right leg this morning. Dressing/Incision - Mild blood tinged drainage from the most proximal incision. Motor Function - intact, moving foot and toes well on exam.   Past Medical History:  Diagnosis Date  . CAD (coronary artery disease)    non-obstructive by cath in 2000 - Myoview in 2008 negative. EF 57% - ETT in 8/10. Walked 9:17 on [place] w no CPor ST-T changes  .  CAD (coronary artery disease)   . Diabetes mellitus   . GERD (gastroesophageal reflux disease)   . Glaucoma   . Hyperlipidemia   . Iron deficiency anemia   . Memory loss   . Osteoporosis     Assessment/Plan: 4 Days Post-Op Procedure(s) (LRB): INTRAMEDULLARY (IM) NAIL INTERTROCHANTRIC (Right) Active Problems:   Hip fracture (HCC)  Estimated body mass index is 23.05 kg/m as calculated from the following:   Height as of this encounter: 6' (1.829 m).   Weight as of this encounter: 77.1 kg. Advance diet Up with therapy Discharge to SNF versus home when cleared by medicine. Follow up at [facility] in 2 weeiks for staple removal and X-rays. Continue Lovenox 40mg  daily for 14 days following discharge. Family requesting urinalysis be ordered for the patient for recent increase in confusion.  Ordered this AM.  DVT Prophylaxis - Lovenox, Foot Pumps and TED hose Weight-Bearing as tolerated to right leg  J. , PA-C Orthopaedic Surgery 10/26/2019, 8:58 AM

## 2019-10-27 LAB — GLUCOSE, CAPILLARY
Glucose-Capillary: 205 mg/dL — ABNORMAL HIGH (ref 70–99)
Glucose-Capillary: 229 mg/dL — ABNORMAL HIGH (ref 70–99)
Glucose-Capillary: 209 mg/dL — ABNORMAL HIGH (ref 70–99)
Glucose-Capillary: 268 mg/dL — ABNORMAL HIGH (ref 70–99)

## 2019-10-27 LAB — BASIC METABOLIC PANEL
Anion gap: 8 (ref 5–15)
BUN: 13 mg/dL (ref 8–23)
CO2: 28 mmol/L (ref 22–32)
Calcium: 9.2 mg/dL (ref 8.9–10.3)
Chloride: 97 mmol/L — ABNORMAL LOW (ref 98–111)
Creatinine, Ser: 0.61 mg/dL (ref 0.61–1.24)
GFR calc Af Amer: >60 mL/min (ref >60)
GFR calc non Af Amer: >60 mL/min (ref >60)
Glucose, Bld: 222 mg/dL — ABNORMAL HIGH (ref 70–99)
Potassium: 3.8 mmol/L (ref 3.5–5.1)
Sodium: 133 mmol/L — ABNORMAL LOW (ref 135–145)

## 2019-10-27 LAB — CBC
HCT: 33.4 % — ABNORMAL LOW (ref 39.0–52.0)
Hemoglobin: 12.1 g/dL — ABNORMAL LOW (ref 13.0–17.0)
MCH: 31.4 pg (ref 26.0–34.0)
MCHC: 36.2 g/dL — ABNORMAL HIGH (ref 30.0–36.0)
MCV: 86.8 fL (ref 80.0–100.0)
Platelets: 203 10*3/uL (ref 150–400)
RBC: 3.85 MIL/uL — ABNORMAL LOW (ref 4.22–5.81)
RDW: 12.6 % (ref 11.5–15.5)
WBC: 8.8 10*3/uL (ref 4.0–10.5)
nRBC: 0 % (ref 0.0–0.2)

## 2019-10-27 LAB — MAGNESIUM: Magnesium: 2 mg/dL (ref 1.7–2.4)

## 2019-10-27 NOTE — Progress Notes (Signed)
Occupational Therapy Treatment Patient Details Name: [name] [name]. MRN: [id number] DOB: May 16, 1934 Today's Date: 10/27/2019    History of present illness [name] [name]. is a 86 y.o. male with medical history significant of CVA, CAD, GERD, glaucoma, hyperlipidemia, insulin-dependent diabetes who presented to the hospital by EMS 10/21/2019 after fall off equipment while at the gym and a resultant intertrochanteric right hip fracture for which he was admitted to the hospital. Underwent intramedullary nailing of R femur on 10/22/2019 and is now WBAT on that LE. Further medical history includes memory loss, osteoporosis, ACLR.   OT comments  Pt seen with PT for co-tx for safety reasons and 2 person assist for standing and ambulating with FWW.  He required extra time and cues for all functional mobility with FWW with 2 person assist and total assist for hygiene after sitting on toilet with BSC over toilet for railings.  He has decreased functional strength, endurance and needs cue for safety and problem solving.  PTA spoke with her wife about rec and concerns and will need somenone with him 24/7 and 2 people for safe transfers and mobility for ADLs.  Pt's wife plans to take him home today despite rec for SNF and rec OT HH.    Follow Up Recommendations  SNF(pt's wife wants to take him home and have care and [facility].)    Equipment Recommendations  3 in 1 bedside commode;Hospital bed;Wheelchair (measurements OT)    Recommendations for Other Services      Precautions / Restrictions Precautions Precautions: Fall Restrictions Weight Bearing Restrictions: Yes RLE Weight Bearing: Weight bearing as tolerated       Mobility Bed Mobility                  Transfers                      Balance                                           ADL either performed or assessed with clinical judgement   ADL Overall ADL's : Needs assistance/impaired                     Lower Body Dressing: Moderate assistance;Set up;With adaptive equipment;Sit to/from stand Lower Body Dressing Details (indicate cue type and reason): Practiced use of reacher and sock aid with mod assist and cues after he stood with min-mod of 2 to ambulate from EOB to recliner with FWW and mod cues for safety and sequencing.               General ADL Comments: Pt seen with PT for co-tx for safety reasons and 2 person assist for standing and ambulating with FWW.  He required extra time and cues for all functional mobility with FWW with 2 person assist and total assist for hygiene after sitting on toilet with BSC over toilet for railings.  He has decreased functional strength, endurance and needs cue for safety and problem solving.  PTA spoke with her wife about rec and concerns and will need somenone with him 24/7 and 2 people for safe transfers and mobility for ADLs.     Vision Baseline Vision/History: Wears glasses Wears Glasses: At all times     Perception     Praxis      Cognition Arousal/Alertness: Awake/alert(initially drowsy  but once he sat at EOB, alertness improves) Behavior During Therapy: [facility] for tasks assessed/performed Overall Cognitive Status: Impaired/Different from baseline Area of Impairment: Orientation;Safety/judgement;Awareness                 Orientation Level: Disoriented to;Place;Time;Situation       Safety/Judgement: Decreased awareness of safety;Decreased awareness of deficits     General Comments: improved ability to follow commands during session with slight delay and needed reminders for sequencing.        Exercises     Shoulder Instructions       General Comments      Pertinent Vitals/ Pain       Pain Assessment: No/denies pain Faces Pain Scale: Hurts even more Pain Location: generally comfortable at rest, some increased pain with moveemnt Pain Descriptors / Indicators: Guarding;Sore Pain Intervention(s): Limited activity  within patient's tolerance;Monitored during session;Repositioned  Home Living                                          Prior Functioning/Environment              Frequency  Min 2X/week        Progress Toward Goals  OT Goals(current goals can now be found in the care plan section)  Progress towards OT goals: Progressing toward goals  Acute Rehab OT Goals Patient Stated Goal: to go home OT Goal Formulation: With patient Time For Goal Achievement: 11/06/19 Potential to Achieve Goals: Good  Plan Discharge plan remains appropriate    Co-evaluation                 AM-PAC OT "6 Clicks" Daily Activity     Outcome Measure   Help from another person eating meals?: None Help from another person taking care of personal grooming?: None Help from another person toileting, which includes using toliet, bedpan, or urinal?: A Lot Help from another person bathing (including washing, rinsing, drying)?: A Lot Help from another person to put on and taking off regular upper body clothing?: None Help from another person to put on and taking off regular lower body clothing?: A Lot 6 Click Score: 18    End of Session    OT Visit Diagnosis: History of falling (Z91.81);Muscle weakness (generalized) (M62.81);Pain Pain - part of body: Leg   Activity Tolerance Patient tolerated treatment well   Patient Left in chair;with call bell/phone within reach;with chair alarm set;with family/visitor present   Nurse Communication          Time: 1448-1856 OT Time Calculation (min): 28 min  Charges: OT General Charges $OT Visit: 1 Visit OT Treatments $Self Care/Home Management : 8-22 mins  Susanne Borders, OTR/L, [place] ascom [phone] 10/27/19, 10:47 AM

## 2019-10-27 NOTE — Progress Notes (Signed)
Subjective: 5 Days Post-Op Procedure(s) (LRB): INTRAMEDULLARY (IM) NAIL INTERTROCHANTRIC (Right) Patient denies significant pain today.  Confusion and responsiveness is still worse at night.  Did better yesterday. Patient is well, and has had no acute complaints or problems Plan is to go home versus Skilled nursing facility after hospital stay. Negative for chest pain and shortness of breath Fever: no Gastrointestinal:negative for nausea and vomiting  Objective: Vital signs in last 24 hours: Temp:  [97.5 F (36.4 C)-98.7 F (37.1 C)] 97.5 F (36.4 C) (02/21 2211) Pulse Rate:  [78-89] 89 (02/21 2211) Resp:  [16-18] 16 (02/21 2211) BP: (170-175)/(67-78) 175/73 (02/21 2211) SpO2:  [97 %-99 %] 97 % (02/21 2211)  Intake/Output from previous day:  Intake/Output Summary (Last 24 hours) at 10/27/2019 8502 Last data filed at 10/27/2019 0405 Gross per 24 hour  Intake 100 ml  Output 600 ml  Net -500 ml    Intake/Output this shift: No intake/output data recorded.  Labs: Recent Labs    10/25/19 1015 10/26/19 0545 10/27/19 0349  HGB 12.1* 11.9* 12.1*   Recent Labs    10/26/19 0545 10/27/19 0349  WBC 7.6 8.8  RBC 3.92* 3.85*  HCT 34.7* 33.4*  PLT 178 203   Recent Labs    10/26/19 0545 10/27/19 0349  NA 134* 133*  K 3.9 3.8  CL 100 97*  CO2 28 28  BUN 11 13  CREATININE 0.63 0.61  GLUCOSE 179* 222*  CALCIUM 8.9 9.2   No results for input(s): LABPT, INR in the last 72 hours.   EXAM General - Patient is more alert this morning when compared to yesterday. Extremity - Neurovascular intact Compartment soft.  Moderate ecchymosis to the right leg this morning. Dressing/Incision - Mild blood tinged drainage from the most proximal incision. Motor Function - intact, moving foot and toes well on exam.   Past Medical History:  Diagnosis Date  . CAD (coronary artery disease)    non-obstructive by cath in 2000 - Myoview in 2008 negative. EF 57% - ETT in 8/10. Walked 9:17  on [place] w no CPor ST-T changes  . CAD (coronary artery disease)   . Diabetes mellitus   . GERD (gastroesophageal reflux disease)   . Glaucoma   . Hyperlipidemia   . Iron deficiency anemia   . Memory loss   . Osteoporosis     Assessment/Plan: 5 Days Post-Op Procedure(s) (LRB): INTRAMEDULLARY (IM) NAIL INTERTROCHANTRIC (Right) Active Problems:   Hip fracture (HCC)  Estimated body mass index is 23.05 kg/m as calculated from the following:   Height as of this encounter: 6' (1.829 m).   Weight as of this encounter: 77.1 kg. Advance diet Up with therapy Discharge to SNF versus home when cleared by medicine. Follow up at [facility] in 2 weeiks for staple removal and X-rays. Continue Lovenox 40mg  daily for 14 days following discharge. Family requesting patient to possibly go home with home health nursing as well as home health physical therapy.  DVT Prophylaxis - Lovenox, Foot Pumps and TED hose Weight-Bearing as tolerated to right leg  , PA-C Orthopaedic Surgery 10/27/2019, 7:12 AM

## 2019-10-27 NOTE — Progress Notes (Signed)
Physical Therapy Treatment Patient Details Name: [name] [name]. MRN: [id number] DOB: 1933/11/16 Today's Date: 10/27/2019    History of Present Illness [name] [name]. is a 86 y.o. male with medical history significant of CVA, CAD, GERD, glaucoma, hyperlipidemia, insulin-dependent diabetes who presented to the hospital by EMS 10/21/2019 after fall off equipment while at the gym and a resultant intertrochanteric right hip fracture for which he was admitted to the hospital. Underwent intramedullary nailing of R femur on 10/22/2019 and is now WBAT on that LE. Further medical history includes memory loss, osteoporosis, ACLR.    PT Comments    Co-tx with OT this session.  9:49-10:17 then til 10:27 PT only.  Only 2 units PT billed.  Pt lethargic this am but able to participate in session.  To EOB with mod a x 2 with some increased difficulty today but good effort.  Once sitting he is able to sit unsupported.  Stood to walker with mod ax 2 and extensive verbal cues for hand placements as he tried to pull up on walker.  Once standing he needs encouragement to place RLE on the ground.  With time he is able to step in place and transition to recliner at bedside.  After OT intervention, he is taken to hallway for gait.  +2 assist and recliner follow for 50' gait with RW.  Again needs cues for hand placements and to look up with gait.  Short shuffling step and overall gait quality decreased with fatigue.  Pt inc BM on pad.  To bathroom with +2 assist to walk in and out from the door.  Care provided - medium BM.    Extensive discussion with wife in room and daughter on phone regarding discharge plan.  Wife voiced continued concern regarding SNF, covid and his confusion.  While those concerns are understood, he continues to need +2 assist for mobility for safety. They do not have 24 hour care lined up a this time or equipment in place.  Strongly encouraged SNF until mobility improves and they can line up  appropriate care.  While daughter can assist with care, wife has limited mobility herself and uses a rollator with balance and safety issues evident while watching her walk in the room.  They will discuss it among themselves as they are still leaning to home.  Pt will need a hospital bed, wheelchair, walker (they think they have one at home), 3-in-1 commode and recommend 24 hour hired care to assist daughter with mobility, HHPT and OT and ambulance transfer home if they decide to return home.  A gait belt would also be helpful for safety with mobility.Discussed with Care Manager.    Patient suffers from hip fracture  which impairs his/her ability to perform daily activities like toileting, feeding, dressing, grooming, bathing in the home. A cane, walker, crutch will not resolve the patient's issue with performing activities of daily living. A lightweight wheelchair is required/recommended and will allow patient to safely perform daily activities.   Patient can safely propel the wheelchair in the home or has a caregiver who can provide assistance.  .    Follow Up Recommendations  SNF;Other (comment)     Equipment Recommendations  Rolling walker with 5" wheels;3in1 (PT);Hospital bed    Recommendations for Other Services       Precautions / Restrictions Precautions Precautions: Fall Restrictions Weight Bearing Restrictions: Yes RLE Weight Bearing: Weight bearing as tolerated    Mobility  Bed Mobility Overal bed mobility: Needs  Assistance Bed Mobility: Supine to Sit;Sit to Supine Rolling: Mod assist;+2 for physical assistance         General bed mobility comments: Exit on R side today, increased time and some increased assist today  Transfers Overall transfer level: Needs assistance Equipment used: Rolling walker (2 wheeled) Transfers: Sit to/from Stand Sit to Stand: Min assist;Mod assist;+2 safety/equipment            Ambulation/Gait Ambulation/Gait assistance: Min  assist;Mod assist;+2 physical assistance Gait Distance (Feet): 50 Feet Assistive device: Rolling walker (2 wheeled) Gait Pattern/deviations: Step-to pattern;Decreased stance time - right;Decreased step length - right;Antalgic;Trunk flexed Gait velocity: decreased   General Gait Details: requiers +2 assist for all mobility   Stairs             Wheelchair Mobility    Modified Rankin (Stroke Patients Only)       Balance Overall balance assessment: History of Falls;Needs assistance Sitting-balance support: Feet supported Sitting balance-Leahy Scale: Fair     Standing balance support: Bilateral upper extremity supported Standing balance-Leahy Scale: Poor Standing balance comment: able to step in place with caution and progress gait today but balance remains poor with +2 assist for safety.                            Cognition Arousal/Alertness: Awake/alert(initially drowsy but once he sat at EOB, alertness improves) Behavior During Therapy: WFL for tasks assessed/performed Overall Cognitive Status: Impaired/Different from baseline Area of Impairment: Orientation;Safety/judgement;Awareness                 Orientation Level: Disoriented to;Place;Time;Situation       Safety/Judgement: Decreased awareness of safety;Decreased awareness of deficits     General Comments: improved ability to follow commands during session with slight delay and needed reminders for sequencing.      Exercises Other Exercises Other Exercises: to commode for medium BM - tech aware    General Comments        Pertinent Vitals/Pain Pain Assessment: Faces Faces Pain Scale: Hurts little more Pain Location: generally comfortable at rest, some increased pain with moveemnt Pain Descriptors / Indicators: Guarding;Sore Pain Intervention(s): Limited activity within patient's tolerance;Monitored during session;Repositioned    Home Living                      Prior  Function            PT Goals (current goals can now be found in the care plan section) Acute Rehab PT Goals Patient Stated Goal: to go home Progress towards PT goals: Progressing toward goals    Frequency    BID      PT Plan Current plan remains appropriate    Co-evaluation PT/OT/SLP Co-Evaluation/Treatment: Yes Reason for Co-Treatment: For patient/therapist safety;To address functional/ADL transfers PT goals addressed during session: Mobility/safety with mobility;Balance OT goals addressed during session: ADL's and self-care      AM-PAC PT "6 Clicks" Mobility   Outcome Measure  Help needed turning from your back to your side while in a flat bed without using bedrails?: A Lot Help needed moving from lying on your back to sitting on the side of a flat bed without using bedrails?: A Lot Help needed moving to and from a bed to a chair (including a wheelchair)?: A Lot Help needed standing up from a chair using your arms (e.g., wheelchair or bedside chair)?: A Lot Help needed to walk in hospital room?: A  Lot Help needed climbing 3-5 steps with a railing? : Total 6 Click Score: 11    End of Session Equipment Utilized During Treatment: Gait belt Activity Tolerance: Patient limited by fatigue Patient left: in chair;with call bell/phone within reach;with chair alarm set;with family/visitor present Nurse Communication: Mobility status Pain - Right/Left: Right Pain - part of body: Hip     Time: 3536-1443 PT Time Calculation (min) (ACUTE ONLY): 38 min  Charges:  $Gait Training: 8-22 mins $Therapeutic Activity: 8-22 mins                     Danielle Dess, PTA 10/27/19, 11:07 AM

## 2019-10-27 NOTE — TOC Progression Note (Signed)
Transition of Care (TOC) - Progression Note    Patient Details  Name: [name] [name]. MRN: [id number] Date of Birth: Dec 20, 1933  Transition of Care [facility]) CM/SW Contact  Barrie Dunker, RN Phone Number: 10/27/2019, 11:52 AM  Clinical Narrative:   Received a call from the patient's wife, She stated that her daughter is setting up a caregiver to be hired, She stated that the patient will need a Hospital bed and a wheelchair, She also requested a urinal, I notified the bedside nurse of the need for a Urinal, [employer] was notified about the need for the DME, The wife would like to use [facility] for [facility] services, PT, Aide and Nurse, I notified [facility]    Expected Discharge Plan: Home w Home Health Services Barriers to Discharge: Continued Medical Work up  Expected Discharge Plan and Services Expected Discharge Plan: Home w Home Health Services In-house Referral: Clinical Social Work     Living arrangements for the past 2 months: Single Family Home                                       Social Determinants of Health (SDOH) Interventions    Readmission Risk Interventions No flowsheet data found.

## 2019-10-27 NOTE — Progress Notes (Addendum)
Patient suffers from hip fracture which impairs their ability to perform daily activities like ADLs in the home.  A walking aid will not resolve issue with performing activities of daily living. A wheelchair will allow patient to safely perform daily activities. Patient is not able to propel themselves in the home using a standard weight wheelchair due to weakness. Patient can self propel in the lightweight wheelchair. Length of need 99 months. Accessories: elevating leg rests ELRs, wheel locks, extensions and anti-tippers and back cushion.

## 2019-10-27 NOTE — Progress Notes (Signed)
Inpatient Diabetes Program Recommendations  AACE/ADA: New Consensus Statement on Inpatient Glycemic Control (2015)  Target Ranges:  Prepandial:   less than 140 mg/dL      Peak postprandial:   less than 180 mg/dL (1-2 hours)      Critically ill patients:  140 - 180 mg/dL   Results for FRANCE, NOYCE (MRN [id number]) as of 10/27/2019 11:46  Ref. Range 10/26/2019 07:42 10/26/2019 11:36 10/26/2019 16:46 10/26/2019 21:31  Glucose-Capillary Latest Ref Range: 70 - 99 mg/dL 062 (H)  3 units NOVOLOG  213 (H)  5 units NOVOLOG  181 (H)  3 units NOVOLOG  258 (H)  3 units NOVOLOG +  10 units LEVEMIR   Results for ERRIK, MITCHELLE (MRN [id number]) as of 10/27/2019 11:46  Ref. Range 10/27/2019 07:38 10/27/2019 11:39  Glucose-Capillary Latest Ref Range: 70 - 99 mg/dL 035 (H)  5 units NOVOLOG  229 (H)    Home DM Meds: Amaryl 2 mg BID         Levemir 32 units QHS       Metformin 1000 mg QHS         Current Orders: Levemir 10 units QHS      Novolog Moderate Correction Scale/ SSI (0-15 units) TID AC + HS     MD- Please consider the following in-hospital insulin adjustments:  1. Increase Levemir to 15 units QHS (50% home dose)  2. Start Novolog Meal Coverage: Novolog 4 units TID with meals  (Please add the following Hold Parameters: Hold if pt eats <50% of meal, Hold if pt NPO)    --Will follow patient during hospitalization--  Ambrose Finland RN, MSN, CDE Diabetes Coordinator Inpatient Glycemic Control Team Team Pager: [phone] (8a-5p)

## 2019-10-27 NOTE — Progress Notes (Signed)
PROGRESS NOTE    [name] [name].  TIW:[id number] DOB: 01-Mar-1934 DOA: 10/21/2019 PCP: Rusty Aus, MD    Assessment & Plan:   Active Problems:   Hip fracture ([place])    [name] [name]. is a 86 y.o. Caucasian male with medical history significant of CVA, CAD, GERD, glaucoma, hyperlipidemia, insulin-dependent diabetes who presents for evaluation after slip and fall while at the gym and a resultant intertrochanteric right hip fracture.  The patient denies any prodromal syncopal events.  Denies any loss of consciousness.  Denies any head trauma.   Hospital delirium, sun-downing Mild dementia at baseline --Pt gets agitated and confused mostly at night.  Seroquel and/or Ativan worked to calm pt and got him to sleep the other day, but family was upset pt was sleepy the next day and wants to avoid sedatives.  The same daughter who came to stay with pt last night (per special nursing permission) may not be able to come again tonight, but we can't have different family member come each night, per current hospital policy.  We also want to avoid getting a hospital sitter because that will prevent pt from being able to discharge to SNF rehab.   PLAN: --continue seroquel 25 mg nightly --Avoid hospital sitter   Intertrochanteric hip fracture, right S/p INTRAMEDULLARY (IM) NAIL INTERTROCHANTRIC with Dr. Posey Pronto No syncopal event, secondary to mechanical fall Plan: --pain control with tylenol  --PT/OT  --WBAT on the operative extremity. Lovenox 40mg /day x 4 weeks --Follow up at [facility] in 2 weeiks for staple removal and X-rays.   Insulin-dependent diabetes Patient is on [place] flex touch 32 units at bedtime Also on Metformin 1000 mg daily Also on glimepiride Plan: Hold oral agents Lantus 10 units every afternoon Moderate sliding scale Carb modified diet when diet advanced  Coronary artery disease Continue daily aspirin Sublingual nitroglycerin  Hyperlipidemia Continue  simvastatin  Depression Restless leg Sertraline 50 mg nightly Pramipexole 0.125 mg nightly Continue home Galantamine   GERD Protonix 40 mg daily  History of CVA Daily ASA    DVT prophylaxis: Lovenox SQ Code Status: Full code  Family Communication: multiple calls to family by [facility] Disposition Plan: Last Thursday, wife had wanted to take pt home, next day Friday, wfie changed her mind and wanted pt to go to SNF rehab.  Today Monday, wife again wanted to take pt home.  DME equipment needed will not be able to be delivered until tomorrow, so discharge is again delayed.  Will discharge home tomorrow.   Subjective and Interval History:  Pt had no complaints.  Worked with PT today.  Pain controlled.  No fever, dyspnea, N/V/D, dysuria, increased swelling.  Family changed their mind again and now wanted to take pt home.     Objective: Vitals:   10/26/19 1650 10/26/19 2211 10/27/19 0736 10/27/19 1631  BP: (!) 173/78 (!) 175/73 (!) 171/73 (!) 151/73  Pulse: 80 89 83 91  Resp: 18 16 17 17   Temp: 98.7 F (37.1 C) (!) 97.5 F (36.4 C) 98.7 F (37.1 C) 98.5 F (36.9 C)  TempSrc: Oral Oral Oral Oral  SpO2: 99% 97% 94% 97%  Weight:      Height:        Intake/Output Summary (Last 24 hours) at 10/27/2019 1701 Last data filed at 10/27/2019 1300 Gross per 24 hour  Intake 120 ml  Output 1000 ml  Net -880 ml   Filed Weights   10/21/19 1603 10/21/19 2251 10/22/19 1237  Weight:  77.1 kg 77.1 kg 77.1 kg    Examination:   Constitutional: NAD, alert, oriented HEENT: conjunctivae and lids normal, EOMI CV: RRR no M,R,G. Distal pulses +2.  No cyanosis.   RESP: CTA B/L, normal respiratory effort  GI: +BS, NTND Extremities: No effusions, edema in BLE SKIN: warm, dry and intact Neuro: II - XII grossly intact.  Sensation intact   Data Reviewed: I have personally reviewed following labs and imaging studies  CBC: Recent Labs  Lab 10/22/19 0510 10/22/19 0510 10/23/19 7371  10/24/19 0550 10/25/19 1015 10/26/19 0545 10/27/19 0349  WBC 12.6*   < > 11.5* 10.4 8.8 7.6 8.8  NEUTROABS 9.1*  --   --   --   --   --   --   HGB 14.5   < > 12.5* 12.4* 12.1* 11.9* 12.1*  HCT 41.6   < > 36.4* 35.6* 34.8* 34.7* 33.4*  MCV 87.4   < > 88.3 88.8 87.7 88.5 86.8  PLT 186   < > 156 164 164 178 203   < > = values in this interval not displayed.   Basic Metabolic Panel: Recent Labs  Lab 10/23/19 0628 10/24/19 0550 10/25/19 1015 10/26/19 0545 10/27/19 0349  NA 134* 130* 134* 134* 133*  K 4.2 3.9 3.8 3.9 3.8  CL 104 98 102 100 97*  CO2 25 25 25 28 28   GLUCOSE 174* 203* 177* 179* 222*  BUN 16 17 13 11 13   CREATININE 0.67 0.73 0.67 0.63 0.61  CALCIUM 8.6* 8.6* 8.9 8.9 9.2  MG 2.1 2.2 2.0 2.1 2.0   GFR: Estimated Creatinine Clearance: 76.3 mL/min (by C-G formula based on SCr of 0.61 mg/dL). Liver Function Tests: No results for input(s): AST, ALT, ALKPHOS, BILITOT, PROT, ALBUMIN in the last 168 hours. No results for input(s): LIPASE, AMYLASE in the last 168 hours. No results for input(s): AMMONIA in the last 168 hours. Coagulation Profile: Recent Labs  Lab 10/21/19 1735 10/22/19 0510  INR 1.0 1.1   Cardiac Enzymes: No results for input(s): CKTOTAL, CKMB, CKMBINDEX, TROPONINI in the last 168 hours. BNP (last 3 results) No results for input(s): PROBNP in the last 8760 hours. HbA1C: No results for input(s): HGBA1C in the last 72 hours. CBG: Recent Labs  Lab 10/26/19 1646 10/26/19 2131 10/27/19 0738 10/27/19 1139 10/27/19 1633  GLUCAP 181* 258* 205* 229* 209*   Lipid Profile: No results for input(s): CHOL, HDL, LDLCALC, TRIG, CHOLHDL, LDLDIRECT in the last 72 hours. Thyroid Function Tests: No results for input(s): TSH, T4TOTAL, FREET4, T3FREE, THYROIDAB in the last 72 hours. Anemia Panel: No results for input(s): VITAMINB12, FOLATE, FERRITIN, TIBC, IRON, RETICCTPCT in the last 72 hours. Sepsis Labs: No results for input(s): PROCALCITON, LATICACIDVEN  in the last 168 hours.  Recent Results (from the past 240 hour(s))  Respiratory Panel by RT PCR (Flu A&B, Covid) - Nasopharyngeal Swab     Status: None   Collection Time: 10/21/19  5:35 PM   Specimen: Nasopharyngeal Swab  Result Value Ref Range Status   SARS Coronavirus 2 by RT PCR NEGATIVE NEGATIVE Final    Comment: (NOTE) SARS-CoV-2 target nucleic acids are NOT DETECTED. The SARS-CoV-2 RNA is generally detectable in upper respiratoy specimens during the acute phase of infection. The lowest concentration of SARS-CoV-2 viral copies this assay can detect is 131 copies/mL. A negative result does not preclude SARS-Cov-2 infection and should not be used as the sole basis for treatment or other patient management decisions. A negative result may occur  with  improper specimen collection/handling, submission of specimen other than nasopharyngeal swab, presence of viral mutation(s) within the areas targeted by this assay, and inadequate number of viral copies (<131 copies/mL). A negative result must be combined with clinical observations, patient history, and epidemiological information. The expected result is Negative. Fact Sheet for Patients:  https://www.moore.com/ Fact Sheet for Healthcare Providers:  https://www.young.biz/ This test is not yet ap proved or cleared by the [place] FDA and  has been authorized for detection and/or diagnosis of SARS-CoV-2 by FDA under an Emergency Use Authorization (EUA). This EUA will remain  in effect (meaning this test can be used) for the duration of the COVID-19 declaration under Section 564(b)(1) of the Act, 21 U.S.C. section 360bbb-3(b)(1), unless the authorization is terminated or revoked sooner.    Influenza A by PCR NEGATIVE NEGATIVE Final   Influenza B by PCR NEGATIVE NEGATIVE Final    Comment: (NOTE) The Xpert Xpress SARS-CoV-2/FLU/RSV assay is intended as an aid in  the diagnosis of influenza  from Nasopharyngeal swab specimens and  should not be used as a sole basis for treatment. Nasal washings and  aspirates are unacceptable for Xpert Xpress SARS-CoV-2/FLU/RSV  testing. Fact Sheet for Patients: https://www.moore.com/ Fact Sheet for Healthcare Providers: https://www.young.biz/ This test is not yet approved or cleared by the [place] FDA and  has been authorized for detection and/or diagnosis of SARS-CoV-2 by  FDA under an Emergency Use Authorization (EUA). This EUA will remain  in effect (meaning this test can be used) for the duration of the  Covid-19 declaration under Section 564(b)(1) of the Act, 21  U.S.C. section 360bbb-3(b)(1), unless the authorization is  terminated or revoked. Performed at [facility], [address] [place], [place] [address]   MRSA PCR Screening     Status: None   Collection Time: 10/22/19  4:53 AM   Specimen: Nasal Mucosa; Nasopharyngeal  Result Value Ref Range Status   MRSA by PCR NEGATIVE NEGATIVE Final    Comment:        The GeneXpert MRSA Assay (FDA approved for NASAL specimens only), is one component of a comprehensive MRSA colonization surveillance program. It is not intended to diagnose MRSA infection nor to guide or monitor treatment for MRSA infections. Performed at [facility], [address], [place], [place] [address]       Radiology Studies: No results found.   Scheduled Meds: . aspirin EC  81 mg Oral Daily  . calcium-vitamin D  1 tablet Oral Q0600  . docusate sodium  100 mg Oral BID  . enoxaparin (LOVENOX) injection  40 mg Subcutaneous Q24H  . galantamine  12 mg Oral BID  . insulin aspart  0-15 Units Subcutaneous TID WC  . insulin aspart  0-5 Units Subcutaneous QHS  . insulin detemir  10 Units Subcutaneous QHS  . latanoprost  1 drop Left Eye QHS  . multivitamin with minerals  1 tablet Oral Daily  . pantoprazole  40 mg Oral Daily  . polyethylene  glycol  17 g Oral BID  . pramipexole  0.125 mg Oral QHS  . Ensure Max Protein  11 oz Oral BID BM  . QUEtiapine  25 mg Oral QHS  . sertraline  50 mg Oral QHS  . simvastatin  40 mg Oral QHS   Continuous Infusions: . sodium chloride    . methocarbamol (ROBAXIN) IV       LOS: 6 days     Darlin Priestly, MD [facility] If 7PM-7AM,  please contact night-coverage 10/27/2019, 5:01 PM

## 2019-10-27 NOTE — Care Management Important Message (Signed)
Important Message  Patient Details  Name: [name] [name]. MRN: [id number] Date of Birth: [birth year]   Medicare Important Message Given:  Yes     Johnell Comings 10/27/2019, 11:16 AM

## 2019-10-27 NOTE — Progress Notes (Signed)
Physical Therapy Treatment Patient Details Name: [name] [name]. MRN: [id number] DOB: 16-Apr-1934 Today's Date: 10/27/2019    History of Present Illness [name] [name]. is a 86 y.o. male with medical history significant of CVA, CAD, GERD, glaucoma, hyperlipidemia, insulin-dependent diabetes who presented to the hospital by EMS 10/21/2019 after fall off equipment while at the gym and a resultant intertrochanteric right hip fracture for which he was admitted to the hospital. Underwent intramedullary nailing of R femur on 10/22/2019 and is now WBAT on that LE. Further medical history includes memory loss, osteoporosis, ACLR.    PT Comments    Called by RN to assist with transferring to commode.  Stood with min/mod a x 2 and transferred to commode at bedside.  Assist for care.  He is then able to walk to doorway with mod a +2 and recliner follow with overall poor gait pattern and safety.  Wife encouraging gait but it was stopped by Clinical research associate for safety.  Transferred back to bed with mod a x 2.  Pt generally fatigued and thankful to be back in bed.  Plan is for discharge for home.  Pt, wife and daughter are aware of assist level and I have discussed challenges with them and my concern over pt's level of assist and fall risk.  They wish to return home despite concerns.   Given gait belt for home use for safety.   Follow Up Recommendations  SNF;Other (comment)     Equipment Recommendations  Rolling walker with 5" wheels;3in1 (PT);Hospital bed    Recommendations for Other Services       Precautions / Restrictions Precautions Precautions: Fall Restrictions Weight Bearing Restrictions: Yes RLE Weight Bearing: Weight bearing as tolerated    Mobility  Bed Mobility Overal bed mobility: Needs Assistance Bed Mobility: Sit to Supine Rolling: Mod assist;+2 for physical assistance     Sit to supine: Min assist   General bed mobility comments: Exit on R side today, increased time and some  increased assist today  Transfers Overall transfer level: Needs assistance Equipment used: Rolling walker (2 wheeled) Transfers: Sit to/from Stand Sit to Stand: Min assist;+2 safety/equipment            Ambulation/Gait Ambulation/Gait assistance: Mod assist;+2 physical assistance Gait Distance (Feet): 20 Feet Assistive device: Rolling walker (2 wheeled) Gait Pattern/deviations: Step-to pattern;Decreased stance time - right;Decreased step length - right;Antalgic;Trunk flexed Gait velocity: decreased   General Gait Details: requiers +2 assist for all mobility   Stairs             Wheelchair Mobility    Modified Rankin (Stroke Patients Only)       Balance Overall balance assessment: History of Falls;Needs assistance Sitting-balance support: Feet supported Sitting balance-Leahy Scale: Fair     Standing balance support: Bilateral upper extremity supported Standing balance-Leahy Scale: Poor Standing balance comment: able to step in place with caution and progress gait today but balance remains poor with +2 assist for safety.                            Cognition Arousal/Alertness: Awake/alert Behavior During Therapy: WFL for tasks assessed/performed Overall Cognitive Status: Within Functional Limits for tasks assessed Area of Impairment: Safety/judgement                 Orientation Level: Disoriented to;Place;Time;Situation       Safety/Judgement: Decreased awareness of safety;Decreased awareness of deficits     General Comments:  more alert this session      Exercises Other Exercises Other Exercises: to commode for medium BM - RN aware    General Comments        Pertinent Vitals/Pain Pain Assessment: Faces Faces Pain Scale: Hurts little more Pain Location: generally comfortable at rest, some increased pain with moveemnt Pain Descriptors / Indicators: Guarding;Sore Pain Intervention(s): Limited activity within patient's  tolerance;Monitored during session    Home Living                      Prior Function            PT Goals (current goals can now be found in the care plan section) Acute Rehab PT Goals Patient Stated Goal: to go home Progress towards PT goals: Progressing toward goals    Frequency    BID      PT Plan Current plan remains appropriate    Co-evaluation PT/OT/SLP Co-Evaluation/Treatment: Yes Reason for Co-Treatment: For patient/therapist safety;To address functional/ADL transfers PT goals addressed during session: Mobility/safety with mobility;Balance OT goals addressed during session: ADL's and self-care      AM-PAC PT "6 Clicks" Mobility   Outcome Measure  Help needed turning from your back to your side while in a flat bed without using bedrails?: A Lot Help needed moving from lying on your back to sitting on the side of a flat bed without using bedrails?: A Lot Help needed moving to and from a bed to a chair (including a wheelchair)?: A Lot Help needed standing up from a chair using your arms (e.g., wheelchair or bedside chair)?: A Lot Help needed to walk in hospital room?: A Lot Help needed climbing 3-5 steps with a railing? : Total 6 Click Score: 11    End of Session Equipment Utilized During Treatment: Gait belt Activity Tolerance: Patient limited by fatigue Patient left: in bed;with call bell/phone within reach;with bed alarm set;with family/visitor present Nurse Communication: Mobility status Pain - Right/Left: Right Pain - part of body: Hip     Time: 1235-1300 PT Time Calculation (min) (ACUTE ONLY): 25 min  Charges:  $Gait Training: 8-22 mins $Therapeutic Activity: 8-22 mins                    Chesley Noon, PTA 10/27/19, 1:31 PM

## 2019-10-27 NOTE — TOC Progression Note (Signed)
Transition of Care (TOC) - Progression Note    Patient Details  Name: [name] [name]. MRN: [id number] Date of Birth: 12-Oct-1933  Transition of Care [facility]) CM/SW Contact  Barrie Dunker, RN Phone Number: 10/27/2019, 4:12 PM  Clinical Narrative:   I have spoken with the patient;s wife Gigi Gin numerous times today, I explained about the Hospital bed needing to be delivered and the wheelchair needing to be delivered prior to DC, she said they were looking to get a bed themselves with a one piece safety rail, I explained that I can only get what is available, she determined that they could not afford to get the kind they want so go ahead with the ones we can get.  I explained that we would DC home once the equipment is delivered, she called back a while later and asked why he was not discharging today, I kept trying to explain I had talked to her earlier and notified her of that, she continued to talk over me.  I explained that I had told her earlier that he would DC once the DME is delivered, the plan was for today but they could not get delivered until tomorrow, she continued to say she thought he was coming today.  I asked her to listen, I explained again, We wanted to discharge him today however due to the bed and Wheelchair not being delivered until tomorrow it will be tomorrow after the DME gets delivered, she stated understanding    Expected Discharge Plan: Home w Home Health Services Barriers to Discharge: Continued Medical Work up  Expected Discharge Plan and Services Expected Discharge Plan: Home w Home Health Services In-house Referral: Clinical Social Work     Living arrangements for the past 2 months: Single Family Home                                       Social Determinants of Health (SDOH) Interventions    Readmission Risk Interventions No flowsheet data found.

## 2019-10-27 NOTE — Progress Notes (Signed)
Patient has Hip Fracture and Dementia which requires lower body to be positioned in ways     not feasible with a normal bed. Head must be elevated at least 30 degrees or more to help with aspiration or          He will possibly have aspiration PNA, And the Hip Fracture needs to be able to be positioned in ways a normal bed will not support for optimal healing and reduced pain.    The lower bed  requires   Frequent changes in body position which cannot be achieved with a normal bed.

## 2019-10-27 NOTE — TOC Progression Note (Signed)
Transition of Care (TOC) - Progression Note    Patient Details  Name: [name] [name]. MRN: [id number] Date of Birth: [birth year]  Transition of Care [facility]) CM/SW Contact  Barrie Dunker, RN Phone Number: 10/27/2019, 8:52 AM  Clinical Narrative:    Spoke with his wife Gigi Gin on the phone, she said that the patient would do better at home and she will hire a Comptroller.  I explained that the patient is medically clear for discharge so they would need to have this in place today so that he can go home.  I also explained that he has a bed offer at [facility] and that it could be beneficial for him to go to rehab for a short time to get stronger and that would also allow them more time to hire a caregiver. She will talk to her daughter and call me back,  I provided my contact information   Expected Discharge Plan: Home w Home Health Services Barriers to Discharge: Continued Medical Work up  Expected Discharge Plan and Services Expected Discharge Plan: Home w Home Health Services In-house Referral: Clinical Social Work     Living arrangements for the past 2 months: Single Family Home                                       Social Determinants of Health (SDOH) Interventions    Readmission Risk Interventions No flowsheet data found.

## 2019-10-28 LAB — BASIC METABOLIC PANEL
Anion gap: 12 (ref 5–15)
BUN: 11 mg/dL (ref 8–23)
CO2: 25 mmol/L (ref 22–32)
Calcium: 9.4 mg/dL (ref 8.9–10.3)
Chloride: 96 mmol/L — ABNORMAL LOW (ref 98–111)
Creatinine, Ser: 0.67 mg/dL (ref 0.61–1.24)
GFR calc Af Amer: >60 mL/min (ref >60)
GFR calc non Af Amer: >60 mL/min (ref >60)
Glucose, Bld: 185 mg/dL — ABNORMAL HIGH (ref 70–99)
Potassium: 3.8 mmol/L (ref 3.5–5.1)
Sodium: 133 mmol/L — ABNORMAL LOW (ref 135–145)

## 2019-10-28 LAB — CBC
HCT: 35.4 % — ABNORMAL LOW (ref 39.0–52.0)
Hemoglobin: 12.7 g/dL — ABNORMAL LOW (ref 13.0–17.0)
MCH: 31.2 pg (ref 26.0–34.0)
MCHC: 35.9 g/dL (ref 30.0–36.0)
MCV: 87 fL (ref 80.0–100.0)
Platelets: 227 10*3/uL (ref 150–400)
RBC: 4.07 MIL/uL — ABNORMAL LOW (ref 4.22–5.81)
RDW: 13 % (ref 11.5–15.5)
WBC: 8 10*3/uL (ref 4.0–10.5)
nRBC: 0 % (ref 0.0–0.2)

## 2019-10-28 LAB — GLUCOSE, CAPILLARY
Glucose-Capillary: 184 mg/dL — ABNORMAL HIGH (ref 70–99)
Glucose-Capillary: 305 mg/dL — ABNORMAL HIGH (ref 70–99)

## 2019-10-28 LAB — MAGNESIUM: Magnesium: 1.9 mg/dL (ref 1.7–2.4)

## 2019-10-28 MED ORDER — ENOXAPARIN SODIUM 40 MG/0.4ML ~~LOC~~ SOLN: 40.0000 mg | SUBCUTANEOUS | 0 refills | Status: DC

## 2019-10-28 MED ORDER — DOCUSATE SODIUM 100 MG PO CAPS: 100.0000 mg | ORAL_CAPSULE | Freq: Two times a day (BID) | ORAL | 0 refills | Status: AC

## 2019-10-28 MED ORDER — BISACODYL 10 MG RE SUPP: 10.0000 mg | Freq: Every day | RECTAL | 0 refills | Status: AC | PRN

## 2019-10-28 MED ORDER — POLYETHYLENE GLYCOL 3350 17 G PO PACK: 17.0000 g | PACK | Freq: Every day | ORAL | 0 refills | Status: AC

## 2019-10-28 NOTE — TOC Progression Note (Signed)
Transition of Care (TOC) - Progression Note    Patient Details  Name: [name] [name]. MRN: [id number] Date of Birth: 1934/04/26  Transition of Care [facility]) CM/SW Contact  Barrie Dunker, RN Phone Number: 10/28/2019, 11:54 AM  Clinical Narrative:   Spoke to the patient's wife Peggy, the DMNE has been delivered to the home and the patient can be discharged, She wants a Commode, that will be delivered to the room, she is on her way to the hospital to go over DC instructions with the bedside nurse    Expected Discharge Plan: Home w Home Health Services Barriers to Discharge: Continued Medical Work up  Expected Discharge Plan and Services Expected Discharge Plan: Home w Home Health Services In-house Referral: Clinical Social Work     Living arrangements for the past 2 months: Single Family Home Expected Discharge Date: 10/28/19                                     Social Determinants of Health (SDOH) Interventions    Readmission Risk Interventions No flowsheet data found.

## 2019-10-28 NOTE — Progress Notes (Signed)
Inpatient Diabetes Program Recommendations  AACE/ADA: New Consensus Statement on Inpatient Glycemic Control (2015)  Target Ranges:  Prepandial:   less than 140 mg/dL      [facility] postprandial:   less than 180 mg/dL (1-2 hours)      Critically ill patients:  140 - 180 mg/dL   Results for KOLBEY, TEICHERT (MRN [id number]) as of 10/28/2019 09:43  Ref. Range 10/27/2019 07:38 10/27/2019 11:39 10/27/2019 16:33 10/27/2019 21:21 10/28/2019 07:54  Glucose-Capillary Latest Ref Range: 70 - 99 mg/dL 564 (H) 332 (H) 951 (H) 268 (H) 184 (H)     Home DM Meds: Amaryl 2 mg BID         Levemir 32 units QHS       Metformin 1000 mg QHS         Current Orders: Levemir 10 units QHS      Novolog Moderate Correction Scale/ SSI (0-15 units) TID AC + HS   MD- Please consider the following in-hospital insulin adjustments:  1. Increase Levemir to 15 units QHS (50% home dose)  2. Start Novolog Meal Coverage: Novolog 4 units TID with meals  (Please add the following Hold Parameters: Hold if pt eats <50% of meal, Hold if pt NPO)   --Will follow patient during hospitalization--  Christena Deem RN, MSN, BC-ADM Inpatient Diabetes Coordinator Team Pager [phone] (8a-5p)

## 2019-10-28 NOTE — TOC Progression Note (Signed)
Transition of Care (TOC) - Progression Note    Patient Details  Name: [name] [name]. MRN: [id number] Date of Birth: [birth year]  Transition of Care [facility]) CM/SW Contact  Barrie Dunker, RN Phone Number: 10/28/2019, 10:17 AM  Clinical Narrative:   Patient to Discharge home today with a paid caregiver and [facility] services, He will DC with EMS to transport home, He will have a hospital bed and Wheelchair delivered today, Brad with [employer] to notify me once delivered so that EMS can be arranged to transport home,     Expected Discharge Plan: Home w Home Health Services Barriers to Discharge: Continued Medical Work up  Expected Discharge Plan and Services Expected Discharge Plan: Home w Home Health Services In-house Referral: Clinical Social Work     Living arrangements for the past 2 months: Single Family Home Expected Discharge Date: 10/28/19                                     Social Determinants of Health (SDOH) Interventions    Readmission Risk Interventions No flowsheet data found.

## 2019-10-28 NOTE — Discharge Summary (Signed)
Physician Discharge Summary   [name] [name].  male DOB: Jul 07, 1934  ELT:[id number]  PCP: Danella Penton, MD  Admit date: 10/21/2019 Discharge date:   Admitted From: home Disposition:  Home.  Pt's hospitalization was prolonged due to family changing their decisions on SNF rehab vs. Home with HH.  In the end, family decided to take pt home.  DME ordered.   Home Health: Yes CODE STATUS: Full code  Discharge Instructions    Diet - low sodium heart healthy   Complete by: As directed    Discharge instructions   Complete by: As directed    Weight bearing as tolerated on the operative extremity. Lovenox 40mg  daily x 2 weeks for prevention of blood clots.  Follow up with orthopedics at [facility] in 2 weeiks for staple removal and X-rays.   Increase activity slowly   Complete by: As directed        Hospital Course:  For full details, please see H&P, progress notes, consult notes and ancillary notes.  Briefly,  [name] Bryanis a 86 y.o.Caucasian malewith medical history significant ofCVA, CAD, GERD, glaucoma, hyperlipidemia, insulin-dependent diabetes who presents for evaluation after slip and fall while at the gym and a resultant intertrochanteric right hip fracture. The patient denied any prodromal syncopal events.   Intertrochanteric hip fracture, right S/p INTRAMEDULLARY (IM) NAIL INTERTROCHANTRIC with Dr. 91 No syncopal event, secondary to mechanical fall.  Post-op, pt's pain was controlled with mostly must tylenol.  Ortho recommended weight bearing as tolerated on the operative extremity. Lovenox 40mg  daily x 2 weeks for prevention of blood clots.  Follow up with orthopedics at [facility] in 2 weeiks for staple removal and X-rays.     Hospital delirium, sun-downing Mild dementia at baseline Pt became agitated and confused mostly at night.  Seroquel and/or Ativan worked to calm pt and got him to sleep the other day, but family was upset pt was sleepy  the next day and wanted to avoid sedatives.  Sitter was avoided due to pt's initial planned discharge to SNF rehab (needed to be 24 hours free of sitter).  Insulin-dependent diabetes During hospitalization, home oral agents were held.  Lantus 10 units every afternoon and moderate sliding scale insulin TID with meals.  Pt was discharged back to home diabetic regimen.  Coronary artery disease Continued daily aspirin  Hyperlipidemia Continued simvastatin  Depression Restless leg Continued home Sertraline 50 mg nightly, Pramipexole 0.125 mg nightly, and home Galantamine   GERD Protonix 40 mg daily  History of CVA Daily ASA    Discharge Diagnoses:  Active Problems:   Hip fracture [facility])    Discharge Instructions:  Allergies as of 10/28/2019      Reactions   Morphine And Related Anxiety   Chlorhexidine    Hydrocodone-acetaminophen Rash      Medication List    STOP taking these medications   magnesium oxide 400 MG tablet Commonly known as: MAG-OX     TAKE these medications   alendronate 70 MG tablet Commonly known as: FOSAMAX Take 70 mg by mouth once a week. Saturday   aspirin 81 MG tablet Take 81 mg by mouth daily.   bisacodyl 10 MG suppository Commonly known as: DULCOLAX Place 1 suppository (10 mg total) rectally daily as needed for moderate constipation.   Calcium 600-200 MG-UNIT tablet Take 1 tablet by mouth daily at 6 (six) AM.   docusate sodium 100 MG capsule Commonly known as: COLACE Take 1 capsule (100 mg  total) by mouth 2 (two) times daily.   enoxaparin 40 MG/0.4ML injection Commonly known as: LOVENOX Inject 0.4 mLs (40 mg total) into the skin daily for 14 days.   EVENING PRIMROSE OIL PO Take 1 capsule by mouth daily.   FISH OIL PO Take 1 capsule by mouth daily.   galantamine 12 MG tablet Commonly known as: RAZADYNE Take 12 mg by mouth 2 (two) times daily.   glimepiride 2 MG tablet Commonly known as: AMARYL Take 2 mg by mouth 2  (two) times daily.   Glucosamine-Chondroitin 500-400 MG Caps Take by mouth daily. Take 3 tabs   latanoprost 0.005 % ophthalmic solution Commonly known as: XALATAN Place 1 drop into the left eye at bedtime.   Levemir FlexPen 100 UNIT/ML Pen Generic drug: Insulin Detemir Inject 32 Units into the skin at bedtime.   metFORMIN 500 MG 24 hr tablet Commonly known as: GLUCOPHAGE-XR Take 1,000 mg by mouth at bedtime.   multivitamin tablet Take 1 tablet by mouth daily.   NEOMYCIN-POLYMYXIN-HYDROCORTISONE 1 % Soln OTIC solution Commonly known as: CORTISPORIN Place 5 drops into both ears daily as needed. Ear wax build up   Nitrostat 0.4 MG SL tablet Generic drug: nitroGLYCERIN TAKE ONE TABLET UNDER THE TONGUE EVERY 5 MINUTES FOR CHEST PAIN. IF NO RELIEF PAST 3RD TAB GO TO EMERGENCY ROOM   omeprazole 20 MG capsule Commonly known as: PRILOSEC Take 20 mg by mouth daily.   oxyCODONE 5 MG immediate release tablet Commonly known as: Oxy IR/ROXICODONE Take 0.5-1 tablets (2.5-5 mg total) by mouth every 4 (four) hours as needed for moderate pain or severe pain (pain score 4-6).   polyethylene glycol 17 g packet Commonly known as: MIRALAX / GLYCOLAX Take 17 g by mouth daily.   pramipexole 0.125 MG tablet Commonly known as: MIRAPEX Take 0.125 mg by mouth at bedtime.   sertraline 50 MG tablet Commonly known as: ZOLOFT Take 50 mg by mouth daily.   simvastatin 40 MG tablet Commonly known as: ZOCOR Take 40 mg by mouth at bedtime.   traMADol 50 MG tablet Commonly known as: ULTRAM Take 1 tablet (50 mg total) by mouth every 6 (six) hours as needed for moderate pain.            Durable Medical Equipment  (From admission, onward)         Start     Ordered   10/27/19 1202  For home use only DME Hospital bed  Once    Question Answer Comment  Length of Need 6 Months   Patient has (list medical condition): Hip Fracture, Dementia   The above medical condition requires: Patient  requires the ability to reposition frequently   Head must be elevated greater than: 30 degrees   Bed type Semi-electric   Support Surface: Gel Overlay      10/27/19 1203   10/27/19 1150  For home use only DME lightweight manual wheelchair with seat cushion  Once    Comments: Patient suffers from hip fracture which impairs their ability to perform daily activities like ADLs in the home.  A walking aid will not resolve issue with performing activities of daily living. A wheelchair will allow patient to safely perform daily activities. Patient is not able to propel themselves in the home using a standard weight wheelchair due to weakness. Patient can self propel in the lightweight wheelchair. Length of need 99 months. Accessories: elevating leg rests ELRs, wheel locks, extensions and anti-tippers and back cushion.   10/27/19  1201          Follow-up Information    Dedra Skeens, [place] On 11/10/2019.   Specialty: Orthopedic Surgery Why: For staple removal and X-rays; @ 10:00 am Contact information: [address] [place] [address] [phone]        Danella Penton, MD. Schedule an appointment as soon as possible for a visit in 1 week(s).   Specialty: Internal Medicine Contact information: [facility] [address] [place] [address] [phone]           Allergies  Allergen Reactions  . Morphine And Related Anxiety  . Chlorhexidine   . Hydrocodone-Acetaminophen Rash     The results of significant diagnostics from this hospitalization (including imaging, microbiology, ancillary and laboratory) are listed below for reference.   Consultations:   Procedures/Studies: DG Chest 1 View  Result Date: 10/21/2019 CLINICAL DATA:  Fall. EXAM: CHEST  1 VIEW COMPARISON:  06/07/2017 FINDINGS: Cardiac enlargement without heart failure. Lungs are clear and well aerated. No infiltrate or effusion. Chronic right rib fractures. IMPRESSION: No  active disease. Electronically Signed   By: Marlan Palau M.D.   On: 10/21/2019 17:05   DG HIP OPERATIVE UNILAT W OR W/O PELVIS RIGHT  Result Date: 10/22/2019 CLINICAL DATA:  Right hip surgery EXAM: OPERATIVE right HIP (WITH PELVIS IF PERFORMED) 6 VIEWS TECHNIQUE: Fluoroscopic spot image(s) were submitted for interpretation post-operatively. COMPARISON:  10/21/2019 FINDINGS: Six low resolution intraoperative spot views of the right hip. Total fluoroscopy time was 1 minutes 30 seconds. The images demonstrate intramedullary rod fixation of right intertrochanteric fracture with anatomic alignment IMPRESSION: Intraoperative fluoroscopic assistance provided during surgical fixation of right hip fracture Electronically Signed   By: Jasmine Pang M.D.   On: 10/22/2019 17:06   DG Hip Unilat W or Wo Pelvis 2-3 Views Right  Result Date: 10/21/2019 CLINICAL DATA:  Fall. EXAM: DG HIP (WITH OR WITHOUT PELVIS) 2-3V RIGHT COMPARISON:  None. FINDINGS: Intertrochanteric fracture right hip with mild angulation. Right hip joint normal. Diffuse arterial calcification. Degenerative change and spurring in the lumbar spine. IMPRESSION: Intertrochanteric fracture right femur. Electronically Signed   By: Marlan Palau M.D.   On: 10/21/2019 17:04      Labs: BNP (last 3 results) No results for input(s): BNP in the last 8760 hours. Basic Metabolic Panel: Recent Labs  Lab 10/24/19 0550 10/25/19 1015 10/26/19 0545 10/27/19 0349 10/28/19 0506  NA 130* 134* 134* 133* 133*  K 3.9 3.8 3.9 3.8 3.8  CL 98 102 100 97* 96*  CO2 25 25 28 28 25   GLUCOSE 203* 177* 179* 222* 185*  BUN 17 13 11 13 11   CREATININE 0.73 0.67 0.63 0.61 0.67  CALCIUM 8.6* 8.9 8.9 9.2 9.4  MG 2.2 2.0 2.1 2.0 1.9   Liver Function Tests: No results for input(s): AST, ALT, ALKPHOS, BILITOT, PROT, ALBUMIN in the last 168 hours. No results for input(s): LIPASE, AMYLASE in the last 168 hours. No results for input(s): AMMONIA in the last 168  hours. CBC: Recent Labs  Lab 10/22/19 0510 10/23/19 7588 10/24/19 0550 10/25/19 1015 10/26/19 0545 10/27/19 0349 10/28/19 0506  WBC 12.6*   < > 10.4 8.8 7.6 8.8 8.0  NEUTROABS 9.1*  --   --   --   --   --   --   HGB 14.5   < > 12.4* 12.1* 11.9* 12.1* 12.7*  HCT 41.6   < > 35.6* 34.8* 34.7* 33.4* 35.4*  MCV 87.4   < >  88.8 87.7 88.5 86.8 87.0  PLT 186   < > 164 164 178 203 227   < > = values in this interval not displayed.   Cardiac Enzymes: No results for input(s): CKTOTAL, CKMB, CKMBINDEX, TROPONINI in the last 168 hours. BNP: Invalid input(s): POCBNP CBG: Recent Labs  Lab 10/27/19 0738 10/27/19 1139 10/27/19 1633 10/27/19 2121 10/28/19 0754  GLUCAP 205* 229* 209* 268* 184*   D-Dimer No results for input(s): DDIMER in the last 72 hours. Hgb A1c No results for input(s): HGBA1C in the last 72 hours. Lipid Profile No results for input(s): CHOL, HDL, LDLCALC, TRIG, CHOLHDL, LDLDIRECT in the last 72 hours. Thyroid function studies No results for input(s): TSH, T4TOTAL, T3FREE, THYROIDAB in the last 72 hours.  Invalid input(s): FREET3 Anemia work up No results for input(s): VITAMINB12, FOLATE, FERRITIN, TIBC, IRON, RETICCTPCT in the last 72 hours. Urinalysis    Component Value Date/Time   COLORURINE YELLOW (A) 10/26/2019 0939   APPEARANCEUR CLEAR (A) 10/26/2019 0939   LABSPEC 1.017 10/26/2019 0939   PHURINE 7.0 10/26/2019 0939   GLUCOSEU 50 (A) 10/26/2019 0939   HGBUR NEGATIVE 10/26/2019 0939   BILIRUBINUR NEGATIVE 10/26/2019 0939   KETONESUR 5 (A) 10/26/2019 0939   PROTEINUR NEGATIVE 10/26/2019 0939   NITRITE NEGATIVE 10/26/2019 0939   LEUKOCYTESUR NEGATIVE 10/26/2019 0939   Sepsis Labs Invalid input(s): PROCALCITONIN,  WBC,  LACTICIDVEN Microbiology Recent Results (from the past 240 hour(s))  Respiratory Panel by RT PCR (Flu A&B, Covid) - Nasopharyngeal Swab     Status: None   Collection Time: 10/21/19  5:35 PM   Specimen: Nasopharyngeal Swab  Result  Value Ref Range Status   SARS Coronavirus 2 by RT PCR NEGATIVE NEGATIVE Final    Comment: (NOTE) SARS-CoV-2 target nucleic acids are NOT DETECTED. The SARS-CoV-2 RNA is generally detectable in upper respiratoy specimens during the acute phase of infection. The lowest concentration of SARS-CoV-2 viral copies this assay can detect is 131 copies/mL. A negative result does not preclude SARS-Cov-2 infection and should not be used as the sole basis for treatment or other patient management decisions. A negative result may occur with  improper specimen collection/handling, submission of specimen other than nasopharyngeal swab, presence of viral mutation(s) within the areas targeted by this assay, and inadequate number of viral copies (<131 copies/mL). A negative result must be combined with clinical observations, patient history, and epidemiological information. The expected result is Negative. Fact Sheet for Patients:  https://www.moore.com/ Fact Sheet for Healthcare Providers:  https://www.young.biz/ This test is not yet ap proved or cleared by the [place] FDA and  has been authorized for detection and/or diagnosis of SARS-CoV-2 by FDA under an Emergency Use Authorization (EUA). This EUA will remain  in effect (meaning this test can be used) for the duration of the COVID-19 declaration under Section 564(b)(1) of the Act, 21 U.S.C. section 360bbb-3(b)(1), unless the authorization is terminated or revoked sooner.    Influenza A by PCR NEGATIVE NEGATIVE Final   Influenza B by PCR NEGATIVE NEGATIVE Final    Comment: (NOTE) The Xpert Xpress SARS-CoV-2/FLU/RSV assay is intended as an aid in  the diagnosis of influenza from Nasopharyngeal swab specimens and  should not be used as a sole basis for treatment. Nasal washings and  aspirates are unacceptable for Xpert Xpress SARS-CoV-2/FLU/RSV  testing. Fact Sheet for  Patients: https://www.moore.com/ Fact Sheet for Healthcare Providers: https://www.young.biz/ This test is not yet approved or cleared by the [place] and  has been authorized  for detection and/or diagnosis of SARS-CoV-2 by  FDA under an Emergency Use Authorization (EUA). This EUA will remain  in effect (meaning this test can be used) for the duration of the  Covid-19 declaration under Section 564(b)(1) of the Act, 21  U.S.C. section 360bbb-3(b)(1), unless the authorization is  terminated or revoked. Performed at [facility], [address] [place], [place] [address]   MRSA PCR Screening     Status: None   Collection Time: 10/22/19  4:53 AM   Specimen: Nasal Mucosa; Nasopharyngeal  Result Value Ref Range Status   MRSA by PCR NEGATIVE NEGATIVE Final    Comment:        The GeneXpert MRSA Assay (FDA approved for NASAL specimens only), is one component of a comprehensive MRSA colonization surveillance program. It is not intended to diagnose MRSA infection nor to guide or monitor treatment for MRSA infections. Performed at [facility], [address] [place], [place] [address]      Total time spend on discharging this patient, including the last patient exam, discussing the hospital stay, instructions for ongoing care as it relates to all pertinent caregivers, as well as preparing the medical discharge records, prescriptions, and/or referrals as applicable, is 30 minutes.    Enzo Bi, MD  [facility] 10/28/2019, 9:53 AM  If 7PM-7AM, please contact night-coverage

## 2019-10-28 NOTE — Progress Notes (Signed)
Physical Therapy Treatment Patient Details Name: [name] [name]. MRN: [id number] DOB: 1933-10-07 Today's Date: 10/28/2019    History of Present Illness Terance Pomplun. is a 86 y.o. male with medical history significant of CVA, CAD, GERD, glaucoma, hyperlipidemia, insulin-dependent diabetes who presented to the hospital by EMS 10/21/2019 after fall off equipment while at the gym and a resultant intertrochanteric right hip fracture for which he was admitted to the hospital. Underwent intramedullary nailing of R femur on 10/22/2019 and is now WBAT on that LE. Further medical history includes memory loss, osteoporosis, ACLR.    PT Comments    Pt was seated in recliner upon arriving with spouse at bedside. Lengthy discussion about d/c concerns and addressing all pt's spouses questions. Pt continues to present with cognition deficits that impact his abilities to safely ambulate/ perform functional mobility. Reports no pain but does guard RLE throughout session. He was able to stand from recliner with use of gait belt + mod assist. Increased time to perform with vcs for hand placement and improved technique. He ambulated to doorway and back 3 x ~ 60 ft with RW + gait belt + min assist. He tolerated well but does fatigue quickly. Pt was repositioned in recliner post session with call bell in reach, spouse at bedside, sitter present, and BLEs elevated. Pt is awaiting EMS transport to home and will have HHPT to address deficits with mobility/transfers/balance/strength to assist pt with returning to PLOF.    Follow Up Recommendations  SNF     Equipment Recommendations  None recommended by PT    Recommendations for Other Services       Precautions / Restrictions Precautions Precautions: Fall Restrictions Weight Bearing Restrictions: Yes RLE Weight Bearing: Weight bearing as tolerated    Mobility  Bed Mobility               General bed mobility comments: Pt seated in recliner pre/post  session  Transfers Overall transfer level: Needs assistance Equipment used: Rolling walker (2 wheeled) Transfers: Sit to/from Stand Sit to Stand: Min assist;Mod assist         General transfer comment: Pt was able to transfer from recliner 2 x with MIn/mod assist. Vcs for handplacement and improved wt bearing on RLE. gait belt used and spouse at bedside throughout.  Ambulation/Gait Ambulation/Gait assistance: Min assist;Mod assist Gait Distance (Feet): 60 Feet Assistive device: Rolling walker (2 wheeled) Gait Pattern/deviations: Step-to pattern;Decreased stance time - right;Decreased step length - right;Antalgic;Trunk flexed Gait velocity: decreased   General Gait Details: Pt was able to ambulate with RW 60 ft with step to pattern. Vcs for posture correction and improved heel strike to toe off. He did require less assistance this afternoon versus this morning.   Stairs             Wheelchair Mobility    Modified Rankin (Stroke Patients Only)       Balance Overall balance assessment: History of Falls;Needs assistance Sitting-balance support: Feet supported Sitting balance-Leahy Scale: Fair     Standing balance support: Bilateral upper extremity supported Standing balance-Leahy Scale: Poor                              Cognition Arousal/Alertness: Awake/alert Behavior During Therapy: Impulsive Overall Cognitive Status: Within Functional Limits for tasks assessed Area of Impairment: Safety/judgement                 Orientation Level: Disoriented to;Place;Time;Situation  Safety/Judgement: Decreased awareness of safety;Decreased awareness of deficits     General Comments: Pt is A but confused. Continued to be able to follow simple commands. Vcs for safety and slow down throughout.      Exercises      General Comments        Pertinent Vitals/Pain Pain Assessment: No/denies pain Faces Pain Scale: Hurts little more Pain  Location: generally comfortable at rest, some increased pain with moveemnt Pain Descriptors / Indicators: Guarding;Sore Pain Intervention(s): Limited activity within patient's tolerance;Monitored during session;Repositioned    Home Living                      Prior Function            PT Goals (current goals can now be found in the care plan section) Progress towards PT goals: Progressing toward goals    Frequency    BID      PT Plan Current plan remains appropriate    Co-evaluation     PT goals addressed during session: Mobility/safety with mobility;Proper use of DME;Strengthening/ROM        AM-PAC PT "6 Clicks" Mobility   Outcome Measure  Help needed turning from your back to your side while in a flat bed without using bedrails?: A Lot Help needed moving from lying on your back to sitting on the side of a flat bed without using bedrails?: A Lot Help needed moving to and from a bed to a chair (including a wheelchair)?: A Lot Help needed standing up from a chair using your arms (e.g., wheelchair or bedside chair)?: A Lot Help needed to walk in hospital room?: A Lot Help needed climbing 3-5 steps with a railing? : A Lot 6 Click Score: 12    End of Session Equipment Utilized During Treatment: Gait belt Activity Tolerance: Patient tolerated treatment well;Other (comment) Patient left: in chair;with nursing/sitter in room;with call bell/phone within reach;with family/visitor present Nurse Communication: Mobility status PT Visit Diagnosis: Unsteadiness on feet (R26.81);History of falling (Z91.81);Difficulty in walking, not elsewhere classified (R26.2);Pain;Muscle weakness (generalized) (M62.81) Pain - Right/Left: Right Pain - part of body: Hip     Time: 1354-1411 PT Time Calculation (min) (ACUTE ONLY): 17 min  Charges:  $Gait Training: 8-22 mins                     Jetta Lout PTA 10/28/19, 2:55 PM

## 2019-10-28 NOTE — Progress Notes (Signed)
Subjective: 6 Days Post-Op Procedure(s) (LRB): INTRAMEDULLARY (IM) NAIL INTERTROCHANTRIC (Right) Patient denies significant pain today.  Confusion and responsiveness is still worse at night.  Did better yesterday.  Is responsive this morning with mild confusion. Patient is well, and has had no acute complaints or problems Plan is to go home after hospital stay.  Awaiting hospital bed before discharge home. Negative for chest pain and shortness of breath Fever: no Gastrointestinal:negative for nausea and vomiting  Objective: Vital signs in last 24 hours: Temp:  [98.5 F (36.9 C)-99.7 F (37.6 C)] 99.7 F (37.6 C) (02/22 2357) Pulse Rate:  [82-91] 82 (02/22 2357) Resp:  [17-18] 18 (02/22 2357) BP: (151-171)/(68-73) 153/68 (02/22 2357) SpO2:  [94 %-97 %] 94 % (02/22 2357)  Intake/Output from previous day:  Intake/Output Summary (Last 24 hours) at 10/28/2019 0722 Last data filed at 10/28/2019 0546 Gross per 24 hour  Intake 360 ml  Output 1900 ml  Net -1540 ml    Intake/Output this shift: No intake/output data recorded.  Labs: Recent Labs    10/25/19 1015 10/26/19 0545 10/27/19 0349 10/28/19 0506  HGB 12.1* 11.9* 12.1* 12.7*   Recent Labs    10/27/19 0349 10/28/19 0506  WBC 8.8 8.0  RBC 3.85* 4.07*  HCT 33.4* 35.4*  PLT 203 227   Recent Labs    10/27/19 0349 10/28/19 0506  NA 133* 133*  K 3.8 3.8  CL 97* 96*  CO2 28 25  BUN 13 11  CREATININE 0.61 0.67  GLUCOSE 222* 185*  CALCIUM 9.2 9.4   No results for input(s): LABPT, INR in the last 72 hours.   EXAM General - Patient is more alert this morning when compared to yesterday. Extremity - Neurovascular intact Compartment soft.  Moderate ecchymosis to the right leg this morning. Dressing/Incision - Mild blood tinged drainage from the most proximal incision.  Ecchymosis involving the proximal thigh.  No increased tenderness. Motor Function - intact, moving foot and toes well on exam.   Past Medical  History:  Diagnosis Date  . CAD (coronary artery disease)    non-obstructive by cath in 2000 - Myoview in 2008 negative. EF 57% - ETT in 8/10. Walked 9:17 on [place] w no CPor ST-T changes  . CAD (coronary artery disease)   . Diabetes mellitus   . GERD (gastroesophageal reflux disease)   . Glaucoma   . Hyperlipidemia   . Iron deficiency anemia   . Memory loss   . Osteoporosis     Assessment/Plan: 6 Days Post-Op Procedure(s) (LRB): INTRAMEDULLARY (IM) NAIL INTERTROCHANTRIC (Right) Active Problems:   Hip fracture (HCC)  Estimated body mass index is 23.05 kg/m as calculated from the following:   Height as of this encounter: 6' (1.829 m).   Weight as of this encounter: 77.1 kg. Advance diet Up with therapy Discharge to SNF versus home when cleared by medicine. [facility] in 2 weeiks for staple removal and X-rays. Continue Lovenox 40mg  daily for 14 days following discharge. Family requesting patient to possibly go home with home health nursing as well as home health physical therapy.  DVT Prophylaxis - Lovenox, Foot Pumps and TED hose Weight-Bearing as tolerated to right leg  , PA-C Orthopaedic Surgery 10/28/2019, 7:22 AM

## 2019-10-28 NOTE — Progress Notes (Signed)
Education on administration of Lovenox SQ was given to patient's wife using demonstration. Education on Lovenox was easier since the patient and wife had previous experience on it. AVS and prescription papers for Tramadol, Oxycodone and Lovenox was given to patient's wife .

## 2019-10-28 NOTE — TOC Progression Note (Signed)
Transition of Care (TOC) - Progression Note    Patient Details  Name: [name] [name]. MRN: [id number] Date of Birth: 1934-02-28  Transition of Care [facility]) CM/SW Contact  Barrie Dunker, RN Phone Number: 10/28/2019, 12:33 PM  Clinical Narrative:   CM called EMS for transportation home, Wife is in the room and education done for Lovenox, Bedside nurse aware that EMS has been called    Expected Discharge Plan: Home w Home Health Services Barriers to Discharge: Continued Medical Work up  Expected Discharge Plan and Services Expected Discharge Plan: Home w Home Health Services In-house Referral: Clinical Social Work     Living arrangements for the past 2 months: Single Family Home Expected Discharge Date: 10/28/19                                     Social Determinants of Health (SDOH) Interventions    Readmission Risk Interventions No flowsheet data found.

## 2019-10-28 NOTE — Progress Notes (Signed)
Physical Therapy Treatment Patient Details Name: [name] [name]. MRN: [id number] DOB: [birth year] Today's Date: 10/28/2019    History of Present Illness [name] [name]. is a 86 y.o. male with medical history significant of CVA, CAD, GERD, glaucoma, hyperlipidemia, insulin-dependent diabetes who presented to the hospital by EMS 10/21/2019 after fall off equipment while at the gym and a resultant intertrochanteric right hip fracture for which he was admitted to the hospital. Underwent intramedullary nailing of R femur on 10/22/2019 and is now WBAT on that LE. Further medical history includes memory loss, osteoporosis, ACLR.    PT Comments    Pt was alert laying in supine with HOB elevated + sitter present upon arriving. He continues to present with cognition deficits and poor safety awareness. Slightly impulsive this date. He is aware that he injured RLE/Hip however unable to state how. Agreeable to OOB activity. Required Mod A to exit bed and min assist from elevated bed height to stand. Pt tends to guard RLE 2/2 to pain. Therapist cued pt to use RLE throughout session. He was able to ambulate ~ 30 ft with RW + mod assist for safety. Gait belt donned throughout. Pt requested to use BR and had BM during session. Required +2 for safety to clean up after BM. He then ambulated to recliner and was able to perform there ex and tolerated well. PT continues to recommend pt D/C to SNF for safety however family plans to take pt home with hired assistance. Pt present with overall limitation with gait, strength, and safe functional mobility. PT recommends family use gait belt at all times he is on his feet and 24 hour supervision for safety. PT will continue to follow pt per POC and will return later this afternoon per POC. Pt is seated in recliner with sitter present post PT session.      Follow Up Recommendations  SNF;Other (comment)(PT recommends SNF however family is planning to d/c home)     Equipment  Recommendations  Rolling walker with 5" wheels;3in1 (PT);Hospital bed    Recommendations for Other Services       Precautions / Restrictions Precautions Precautions: Fall Restrictions Weight Bearing Restrictions: Yes RLE Weight Bearing: Weight bearing as tolerated    Mobility  Bed Mobility Overal bed mobility: Needs Assistance Bed Mobility: Supine to Sit     Supine to sit: Mod assist     General bed mobility comments: With HOB elevated, pt was able to transition from supine to EOB sitting with mod assist mostly for trunk control. Increased time to perform with vcs for safety and technique improvements.  Transfers Overall transfer level: Needs assistance Equipment used: Rolling walker (2 wheeled) Transfers: Sit to/from Stand Sit to Stand: Min assist;From elevated surface         General transfer comment: Pt was able to sit EOB x 2-3 minutes prior to transferring. No LOB sitting EOB however multiple vcs for setting RLE on floor for support. pt guards LLE 2/2 pain? Gait belt use for safety. He was able to STS from EOB and on/off toilet with min assist + max vcs for safety/technique. Vcs to put RLE on floor prior and during transfers.   Ambulation/Gait Ambulation/Gait assistance: Mod assist(sitter present but did not assist) Gait Distance (Feet): 30 Feet Assistive device: Rolling walker (2 wheeled) Gait Pattern/deviations: Step-to pattern;Decreased stance time - right;Decreased step length - right;Antalgic;Trunk flexed Gait velocity: decreased   General Gait Details: Pt was able to ambulate from EOB to doorway and then  into BR. He had large BM and then ambulated back to recliner ~ 30 ft each trial with RW + mod assist for safety. constnat vcs for safety and sequencing. Overall tolerated gait training well butrequires use of gait belt for safety.   Stairs             Wheelchair Mobility    Modified Rankin (Stroke Patients Only)       Balance Overall balance  assessment: History of Falls;Needs assistance Sitting-balance support: Feet supported Sitting balance-Leahy Scale: Fair     Standing balance support: Bilateral upper extremity supported Standing balance-Leahy Scale: Poor                              Cognition Arousal/Alertness: Awake/alert Behavior During Therapy: Impulsive Overall Cognitive Status: Within Functional Limits for tasks assessed Area of Impairment: Safety/judgement                 Orientation Level: Disoriented to;Place;Time;Situation       Safety/Judgement: Decreased awareness of safety;Decreased awareness of deficits     General Comments: Pt is alert however continues to present with overall confusion. He was able to follow single step commands throughout however demonstrates impulsiveness and major safety/judgment deficits. He was aware of R hip injury however unable to statewhy/what happened.       Exercises General Exercises - Lower Extremity Ankle Circles/Pumps: AROM;20 reps;Seated;Both Quad Sets: AROM;20 reps;Seated;Right Long Arc Quad: AROM;10 reps;Seated;Right Heel Slides: AROM;Right;10 reps;Seated Hip ABduction/ADduction: AROM;Right;10 reps;Seated Straight Leg Raises: AAROM;AROM;10 reps;Seated    General Comments        Pertinent Vitals/Pain Pain Assessment: No/denies pain(when asked however demonstrates signs of pain with movement) Faces Pain Scale: Hurts little more Pain Location: generally comfortable at rest, some increased pain with moveemnt Pain Descriptors / Indicators: Guarding;Sore Pain Intervention(s): Limited activity within patient's tolerance;Monitored during session;Repositioned    Home Living                      Prior Function            PT Goals (current goals can now be found in the care plan section) Acute Rehab PT Goals Patient Stated Goal: to go home Progress towards PT goals: Progressing toward goals    Frequency    BID      PT  Plan Current plan remains appropriate    Co-evaluation     PT goals addressed during session: Mobility/safety with mobility;Strengthening/ROM        AM-PAC PT "6 Clicks" Mobility   Outcome Measure  Help needed turning from your back to your side while in a flat bed without using bedrails?: A Lot Help needed moving from lying on your back to sitting on the side of a flat bed without using bedrails?: A Lot Help needed moving to and from a bed to a chair (including a wheelchair)?: A Lot Help needed standing up from a chair using your arms (e.g., wheelchair or bedside chair)?: A Lot Help needed to walk in hospital room?: A Lot Help needed climbing 3-5 steps with a railing? : Total 6 Click Score: 11    End of Session Equipment Utilized During Treatment: Gait belt Activity Tolerance: Patient tolerated treatment well;Other (comment)(cognition and safety awareness limits progression) Patient left: in chair;with nursing/sitter in room;with call bell/phone within reach Nurse Communication: Mobility status PT Visit Diagnosis: Unsteadiness on feet (R26.81);History of falling (Z91.81);Difficulty in walking, not elsewhere classified (  R26.2);Pain;Muscle weakness (generalized) (M62.81) Pain - Right/Left: Right Pain - part of body: Hip     Time: 7116-5790 PT Time Calculation (min) (ACUTE ONLY): 26 min  Charges:  $Gait Training: 8-22 mins $Therapeutic Exercise: 8-22 mins                    Jetta Lout PTA 10/28/19, 11:00 AM

## 2019-10-29 NOTE — TOC Progression Note (Signed)
Transition of Care (TOC) - Progression Note    Patient Details  Name: [name] [name]. MRN: [id number] Date of Birth: 1934-07-12  Transition of Care [facility]) CM/SW Contact  Barrie Dunker, RN Phone Number: 10/29/2019, 1:22 PM  Clinical Narrative:  Daughter called asking why HH has not called them yet, I explained that I would call [facility] and ask them to give the family a call , I contacted [facility] and spoke to [place] and she agrees to contact the family   Expected Discharge Plan: Home w Home Health Services Barriers to Discharge: Continued Medical Work up  Expected Discharge Plan and Services Expected Discharge Plan: Home w Home Health Services In-house Referral: Clinical Social Work     Living arrangements for the past 2 months: Single Family Home Expected Discharge Date: 10/28/19                                     Social Determinants of Health (SDOH) Interventions    Readmission Risk Interventions No flowsheet data found.

## 2019-11-19 ENCOUNTER — Ambulatory Visit: Payer: Medicare Other | Admitting: Cardiovascular Disease

## 2019-12-29 ENCOUNTER — Ambulatory Visit: Payer: Medicare PPO | Admitting: Cardiovascular Disease

## 2020-01-25 NOTE — Progress Notes (Signed)
Cardiology Office Note  Date:  01/27/2020   ID:  [name] Sierras., DOB 28-Jul-1934, MRN [id number]  PCP:  Rusty Aus, MD   Chief Complaint  Patient presents with  . OTHER    12 month f/u no complaints today. Meds reviewed verbally with pt.    HPI:  [name] [name] is a 86 year-old male with  >20-year history of diabetes  hemoglobin A1c around 7.5,  hyperlipidemia,  cardiac catheterization in 2000, moderate nonobstructive coronary disease with a 50% LAD lesion and a 60% lesion in his right coronary artery. presents for routine followup of his coronary artery disease  In the hospital after slip and fall 10/2019 Had delerium likely exacerbated by Seroquel and Ativan Right hip fracture Had difficult time recovering secondary to delirium Had to get sitters at home in recovery  Hip doing "fair", uses a cane  He is still driving "crazy doctors trying to take my license" Reports he was seen by neurology, they did not want him driving  Walks the block daily, Reading blocks Hearing worse  Wife presents today, Old CVA affected cognitive issues  MR 2018: Old small LEFT cerebellar infarct. Moderate to severe parenchymal brain volume loss. Moderate to severe chronic small vessel ischemic disease.  Labs  HBA1C 8.0, on extra insulin before dinner Total chol 101, LDL 51  EKG personally reviewed by myself on todays visit NSR no St or T wave changes, rate 81  Other past medical history reviewed Prior history atypical chest pain overnight when getting ready for bed  Memory problems  difficulty with numbers, directions Followed by neurology  Prior trip to [place] Acted "funny on the plane "collapsed" the next day walking in the heat Evaluation in the hospital MRI    Myoview  April 2008 showed normal LV function, EF of 57% with no ischemia or infarction.     CP Several years ago and underwent ETT in 8/10. Walked 9:17 on [place] with no CP or ST-T changes.   PMH:   has a past  medical history of CAD (coronary artery disease), CAD (coronary artery disease), Diabetes mellitus, GERD (gastroesophageal reflux disease), Glaucoma, Hyperlipidemia, Iron deficiency anemia, Memory loss, and Osteoporosis.  PSH:    Past Surgical History:  Procedure Laterality Date  . ANTERIOR CRUCIATE LIGAMENT REPAIR    . Cath (other)    . colon polyps removed     x2  . INTRAMEDULLARY (IM) NAIL INTERTROCHANTERIC Right 10/22/2019   Procedure: INTRAMEDULLARY (IM) NAIL INTERTROCHANTRIC;  Surgeon: Leim Fabry, MD;  Location: [facility] ORS;  Service: Orthopedics;  Laterality: Right;  . removal r eye      Current Outpatient Medications  Medication Sig Dispense Refill  . aspirin 81 MG tablet Take 81 mg by mouth daily.      . bisacodyl (DULCOLAX) 10 MG suppository Place 1 suppository (10 mg total) rectally daily as needed for moderate constipation. (Patient taking differently: Place 10 mg rectally daily as needed for moderate constipation. )  0  . Calcium 600-200 MG-UNIT tablet Take 1 tablet by mouth daily at 6 (six) AM.    . denosumab (PROLIA) 60 MG/ML SOSY injection Inject into the skin every 6 (six) months.    . docusate sodium (COLACE) 100 MG capsule Take 1 capsule (100 mg total) by mouth 2 (two) times daily.  0  . EVENING PRIMROSE OIL PO Take 1 capsule by mouth daily.    [name] galantamine (RAZADYNE) 12 MG tablet Take 12 mg by mouth 2 (two) times daily.     [name]  glimepiride (AMARYL) 2 MG tablet Take 2 mg by mouth 2 (two) times daily.     . Glucosamine-Chondroitin 500-400 MG CAPS Take by mouth daily. Take 3 tabs     . insulin aspart (NOVOLOG) 100 UNIT/ML FlexPen 8 Units at bedtime.     . Insulin Detemir (LEVEMIR FLEXPEN) 100 UNIT/ML Pen Inject 32 Units into the skin at bedtime.     [name] latanoprost (XALATAN) 0.005 % ophthalmic solution Place 1 drop into the left eye at bedtime.    . metFORMIN (GLUCOPHAGE-XR) 500 MG 24 hr tablet Take 1,000 mg by mouth at bedtime.    . Multiple Vitamin (MULTIVITAMIN) tablet  Take 1 tablet by mouth daily.    . NEOMYCIN-POLYMYXIN-HYDROCORTISONE (CORTISPORIN) 1 % SOLN OTIC solution Place 5 drops into both ears daily as needed. Ear wax build up    . NITROSTAT 0.4 MG SL tablet TAKE ONE TABLET UNDER THE TONGUE EVERY 5 MINUTES FOR CHEST PAIN. IF NO RELIEF PAST 3RD TAB GO TO EMERGENCY ROOM 25 tablet 1  . Omega-3 Fatty Acids (FISH OIL PO) Take 1 capsule by mouth daily.    [name] omeprazole (PRILOSEC) 20 MG capsule Take 20 mg by mouth daily.      [name] oxyCODONE (OXY IR/ROXICODONE) 5 MG immediate release tablet Take 0.5-1 tablets (2.5-5 mg total) by mouth every 4 (four) hours as needed for moderate pain or severe pain (pain score 4-6). (Patient taking differently: Take 2.5-5 mg by mouth every 4 (four) hours as needed for moderate pain or severe pain (pain score 4-6). ) 30 tablet 0  . polyethylene glycol (MIRALAX / GLYCOLAX) 17 g packet Take 17 g by mouth daily.  0  . pramipexole (MIRAPEX) 0.125 MG tablet Take 0.125 mg by mouth at bedtime.    . sertraline (ZOLOFT) 50 MG tablet Take 50 mg by mouth daily.    . simvastatin (ZOCOR) 40 MG tablet Take 40 mg by mouth at bedtime.      . traMADol (ULTRAM) 50 MG tablet Take 1 tablet (50 mg total) by mouth every 6 (six) hours as needed for moderate pain. (Patient taking differently: Take 50 mg by mouth every 6 (six) hours as needed for moderate pain. ) 30 tablet 0   No current facility-administered medications for this visit.     Allergies:   Morphine and related, Chlorhexidine, and Hydrocodone-acetaminophen   Social History:  The patient  reports that he has never smoked. He has never used smokeless tobacco. He reports current alcohol use. He reports that he does not use drugs.   Family History:   family history includes Arthritis in his sister; Heart attack in his father; Stroke in his mother.    Review of Systems: Review of Systems  Constitutional: Negative.   Respiratory: Negative.   Cardiovascular: Negative.   Gastrointestinal:  Negative.   Musculoskeletal: Negative.   Neurological: Negative.   Psychiatric/Behavioral: Positive for memory loss.  All other systems reviewed and are negative.    PHYSICAL EXAM: VS:  BP 108/60 (BP Location: Left Arm, Patient Position: Sitting, Cuff Size: Normal)   Pulse 81   Ht 5\' 9"  (1.753 m)   Wt 176 lb 6 oz (80 kg)   SpO2 98%   BMI 26.05 kg/m  , BMI Body mass index is 26.05 kg/m. Constitutional:  oriented to person, place, and time. No distress.  HENT:  Head: Grossly normal Eyes:  no discharge. No scleral icterus.  Neck: No JVD, no carotid bruits  Cardiovascular: Regular rate and rhythm, no  murmurs appreciated Pulmonary/Chest: Clear to auscultation bilaterally, no wheezes or rails Abdominal: Soft.  no distension.  no tenderness.  Musculoskeletal: Normal range of motion Neurological:  normal muscle tone. Coordination normal. No atrophy Skin: Skin warm and dry Psychiatric: normal affect, pleasant   Recent Labs: 10/28/2019: BUN 11; Creatinine, Ser 0.67; Hemoglobin 12.7; Magnesium 1.9; Platelets 227; Potassium 3.8; Sodium 133    Lipid Panel Lab Results  Component Value Date   CHOL 88 06/07/2017   HDL 36 (L) 06/07/2017   LDLCALC 39 06/07/2017   TRIG 66 06/07/2017      Wt Readings from Last 3 Encounters:  01/27/20 176 lb 6 oz (80 kg)  10/22/19 169 lb 15.6 oz (77.1 kg)  06/25/18 166 lb 8 oz (75.5 kg)       ASSESSMENT AND PLAN:  Atherosclerosis of native coronary artery of native heart with stable angina pectoris (HCC) -  Currently with no symptoms of angina. No further workup at this time. Continue current medication regimen.  Type 2 diabetes mellitus with other circulatory complication, without long-term current use of insulin (HCC)  Managed by primary care and endocrinology Diabetes numbers are higher A1c of 8  PAD (peripheral artery disease) (HCC) - Plan: EKG 12-Lead Cholesterol at goal, stressed importance of working on his diabetes  Mixed  hyperlipidemia -  Cholesterol is at goal on the current lipid regimen. No changes to the medications were made.  Dementia Followed by neurology, some memory issues Will defer to neurology whether he should be driving Wife feels delirium as demonstrated in the hospital earlier in the year has improved, now at his baseline    Total encounter time more than 25 minutes  Greater than 50% was spent in counseling and coordination of care with the patient  Disposition:   F/U  12 months   Orders Placed This Encounter  Procedures  . EKG 12-Lead     Signed, Dossie Arbour, M.D., Ph.D. 01/27/2020  [facility] [place], [place] [phone]

## 2020-01-27 ENCOUNTER — Encounter: Payer: Self-pay | Admitting: Cardiovascular Disease

## 2020-01-27 ENCOUNTER — Other Ambulatory Visit: Payer: Self-pay

## 2020-01-27 ENCOUNTER — Ambulatory Visit (INDEPENDENT_AMBULATORY_CARE_PROVIDER_SITE_OTHER): Payer: Medicare PPO | Admitting: Cardiovascular Disease

## 2020-01-27 VITALS — BP 108/60 | HR 81 | Ht 69.0 in | Wt 176.4 lb

## 2020-01-27 DIAGNOSIS — I25118 Atherosclerotic heart disease of native coronary artery with other forms of angina pectoris: Secondary | ICD-10-CM

## 2020-01-27 DIAGNOSIS — I739 Peripheral vascular disease, unspecified: Secondary | ICD-10-CM

## 2020-01-27 DIAGNOSIS — E1159 Type 2 diabetes mellitus with other circulatory complications: Secondary | ICD-10-CM | POA: Diagnosis not present

## 2020-01-27 DIAGNOSIS — E782 Mixed hyperlipidemia: Secondary | ICD-10-CM

## 2020-01-27 NOTE — Patient Instructions (Signed)

## 2020-10-06 IMAGING — XA DG HIP (WITH PELVIS) OPERATIVE*R*
6 series · 6 of 6 positions shown · non-contrast
Comparison: 10/21/2019

CLINICAL DATA: Right hip surgery

EXAM:
OPERATIVE right HIP (WITH PELVIS IF PERFORMED) 6 VIEWS
TECHNIQUE: Fluoroscopic spot image(s) were submitted for interpretation
post-operatively.

[Series 9: cont. · 1 of 1 slices shown (1 of 6)]
[im 1/1]
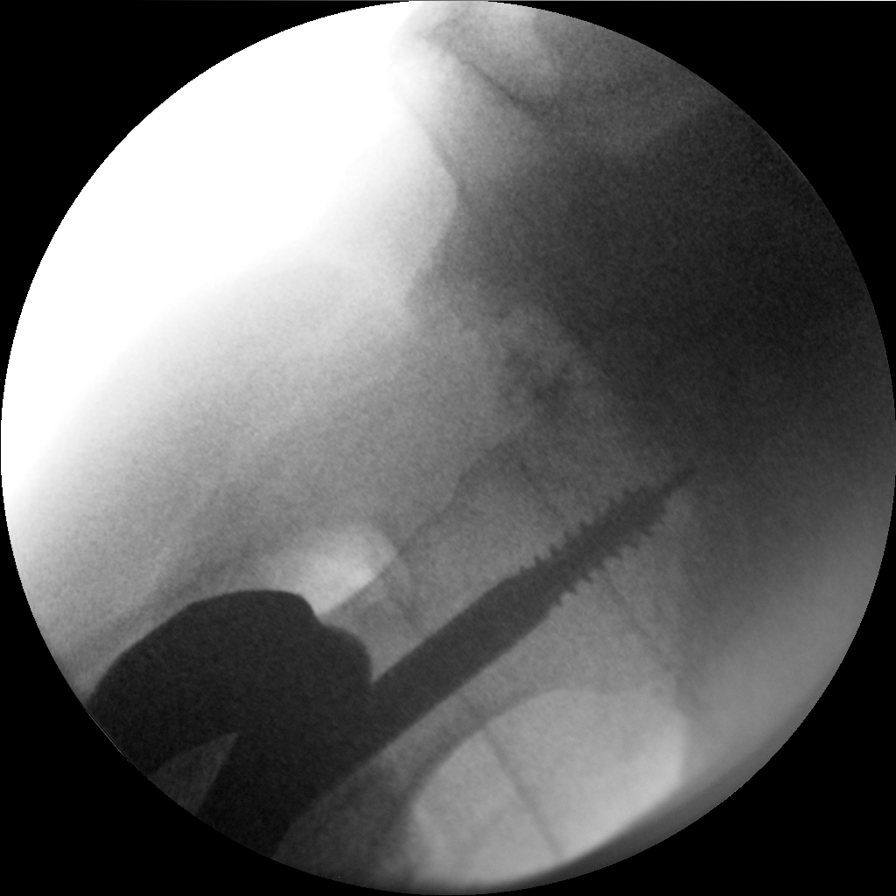

[Series 10: cont. · 1 of 1 slices shown (2 of 6)]
[im 1/1]
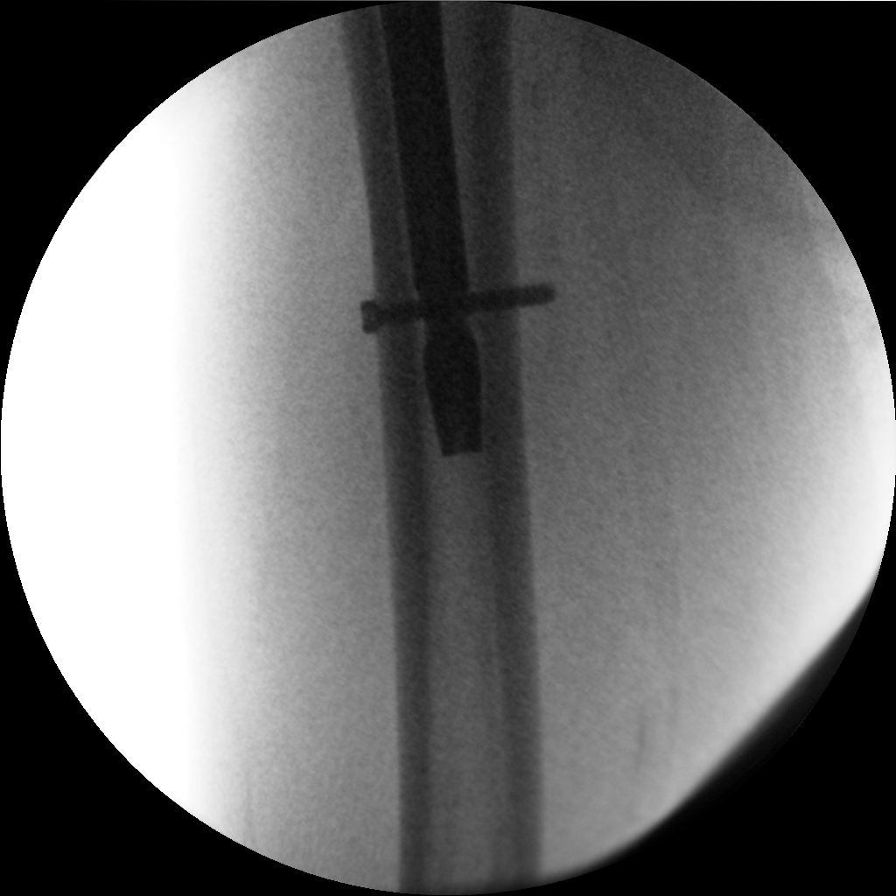

[Series 11: cont. · 1 of 1 slices shown (3 of 6)]
[im 1/1]
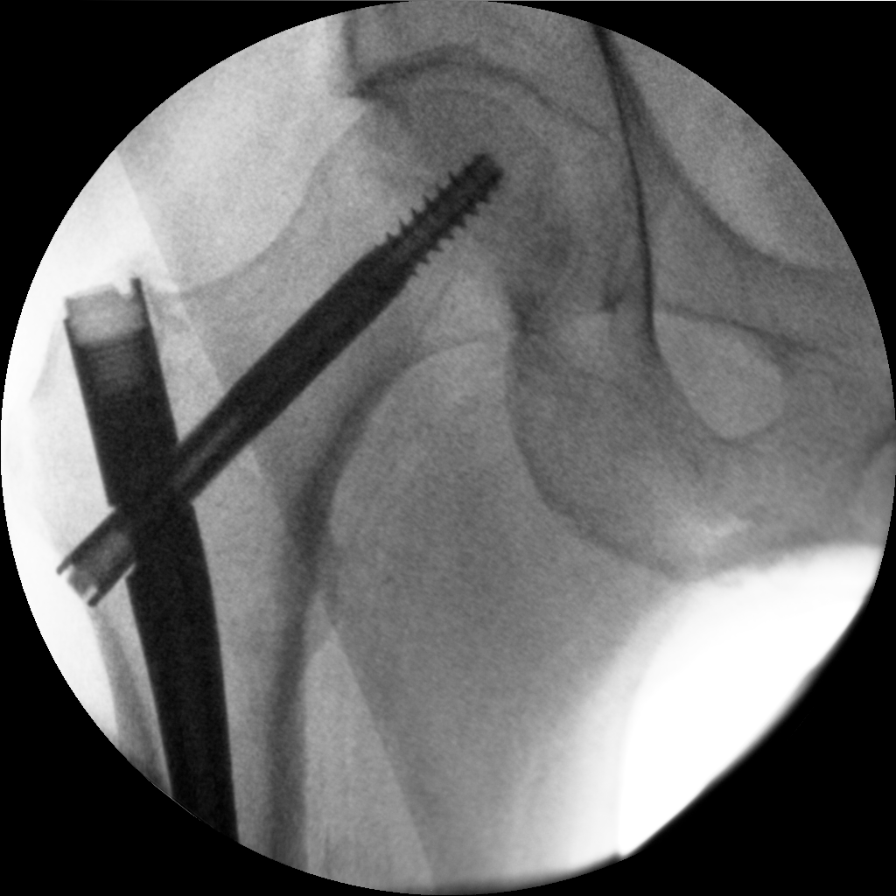

[Series 12: cont. · 1 of 1 slices shown (4 of 6)]
[im 1/1]
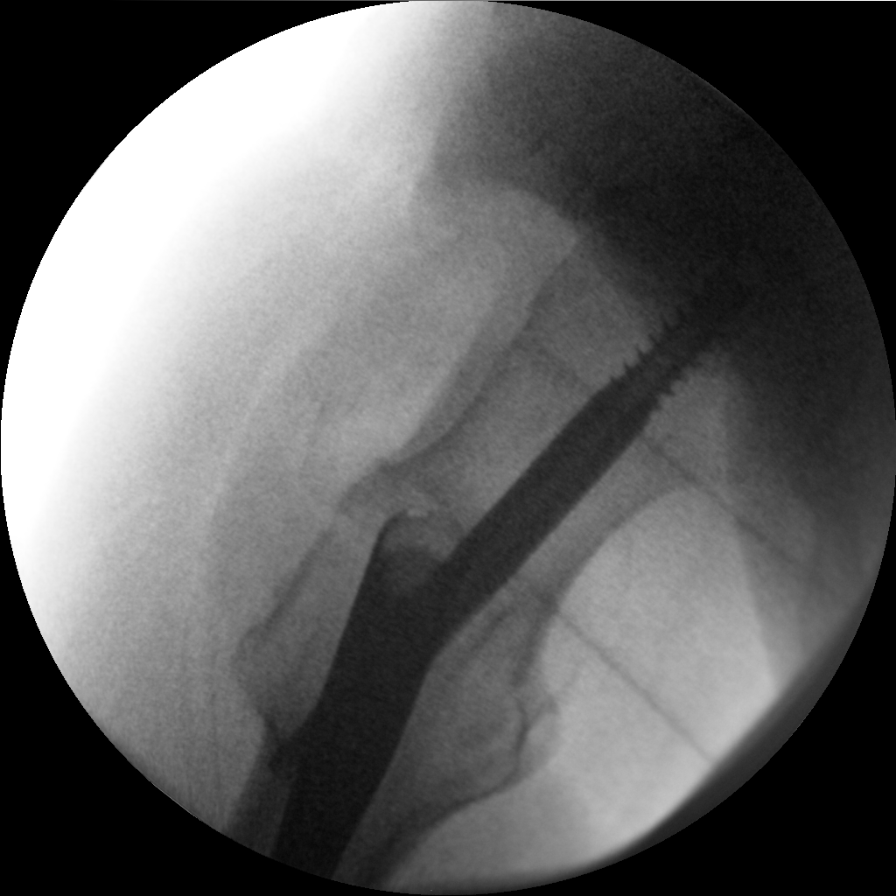

[Series 13: cont. · 1 of 1 slices shown (5 of 6)]
[im 1/1]
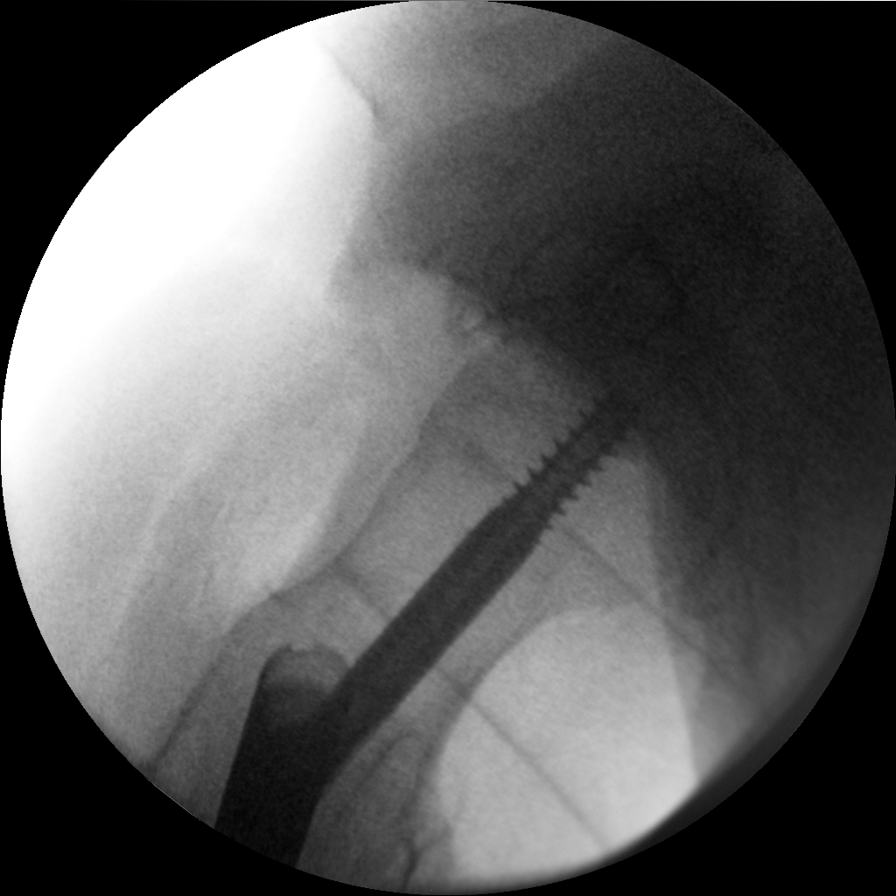

[Series 14: cont. · 1 of 1 slices shown (6 of 6)]
[im 1/1]
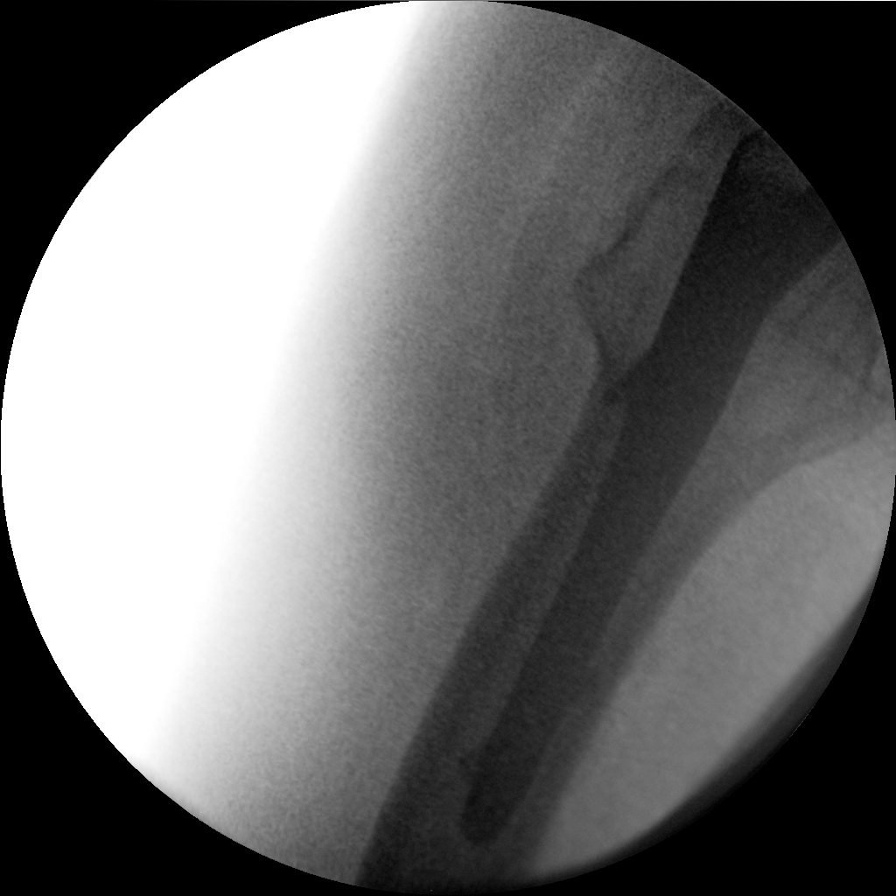

[6 of 6 positions shown; findings below may reference images not displayed]

FINDINGS: Six low resolution intraoperative spot views of the right hip. Total
fluoroscopy time was 1 minutes 30 seconds. The images demonstrate
intramedullary rod fixation of right intertrochanteric fracture with
anatomic alignment
IMPRESSION: Intraoperative fluoroscopic assistance provided during surgical
fixation of right hip fracture

## 2020-12-24 ENCOUNTER — Telehealth: Payer: Self-pay | Admitting: Cardiovascular Disease

## 2020-12-24 NOTE — Telephone Encounter (Signed)
Patient's wife called, states patient "gives out" . States when he walks a long way, his knees gives out. States he feels weak. Also, states he has had a "silent stroke" about 2 years ago . Please call to discuss.

## 2020-12-24 NOTE — Telephone Encounter (Signed)
Was able to return pt's wife phone call [name] [name] (DPR approved). Wife concern, stating [name] [name] has been "getting weak in the knees". [name] [name] reports [name] [name] "has been over doing it", still working in the garden, he goes for long periods and will not rest and he will lose his balance and fall stating "weak knees", also reports the other night he got out of bed to use the BR and when returning he got "off balance and almost fell". [name] [name] reports he has had a "slient stroke" before but feels this is not similar. Asked if vitals are checked during these times and she reports BP 129-139 systolic and HR in the 60s, BS has been in the 200s.  No symptoms of CP, dizziness, or shob.   [name] [name] has an appt next week with neurologist and was able to get an appt with [facility] walker next week as well on 4/28. Advised to reach out to [name] [name] PCP [name] [name] to see if a sooner appt can be made for an evaluation as well since it seems to be more of "weak knees" that is causing his off balance and falls especially after exertion. Advised to watch for symptoms such as CP, shob, dizziness, weakness to one side, diaphoresis, and monitor BS as well. Keep a check of his BP and HR also, if any symptoms emerge or low BP, HR, or low sugar then call 911 or call the office for advice. [name] [name] verbalized understanding, will call [name] [name] as well, she is grateful for the return call and advice, otherwise all questions or concerns were address and no additional concerns at this time. Agreeable to plan, will call back for anything further.

## 2020-12-30 ENCOUNTER — Ambulatory Visit: Payer: Medicare PPO | Admitting: Family

## 2020-12-30 ENCOUNTER — Other Ambulatory Visit: Payer: Self-pay

## 2020-12-30 ENCOUNTER — Encounter: Payer: Self-pay | Admitting: Family

## 2020-12-30 VITALS — BP 130/72 | HR 71 | Ht 69.0 in | Wt 182.0 lb

## 2020-12-30 DIAGNOSIS — R5383 Other fatigue: Secondary | ICD-10-CM

## 2020-12-30 DIAGNOSIS — R42 Dizziness and giddiness: Secondary | ICD-10-CM

## 2020-12-30 DIAGNOSIS — R0600 Dyspnea, unspecified: Secondary | ICD-10-CM

## 2020-12-30 DIAGNOSIS — R531 Weakness: Secondary | ICD-10-CM

## 2020-12-30 DIAGNOSIS — I2510 Atherosclerotic heart disease of native coronary artery without angina pectoris: Secondary | ICD-10-CM | POA: Diagnosis not present

## 2020-12-30 DIAGNOSIS — E1159 Type 2 diabetes mellitus with other circulatory complications: Secondary | ICD-10-CM | POA: Diagnosis not present

## 2020-12-30 DIAGNOSIS — R0609 Other forms of dyspnea: Secondary | ICD-10-CM

## 2020-12-30 NOTE — Patient Instructions (Signed)
Medication Instructions:  Continue your current medications.   *If you need a refill on your cardiac medications before your next appointment, please call your pharmacy*   Lab Work: Your provider recommends lab work today: CBC, CMP, TSH  If you have labs (blood work) drawn today and your tests are completely normal, you will receive your results only by: [name] MyChart Message (if you have MyChart) OR . A paper copy in the mail If you have any lab test that is abnormal or we need to change your treatment, we will call you to review the results.   Testing/Procedures: Your EKG today showed normal sinus rhythm. This is a good result!  Your physician has requested that you have an echocardiogram. Echocardiography is a painless test that uses sound waves to create images of your heart. It provides your doctor with information about the size and shape of your heart and how well your heart's chambers and valves are working. This procedure takes approximately one hour. There are no restrictions for this procedure.  Follow-Up: At [facility], you and your health needs are our priority.  As part of our continuing mission to provide you with exceptional heart care, we have created designated Provider Care Teams.  These Care Teams include your primary Cardiologist (physician) and Advanced Practice Providers (APPs -  Physician Assistants and Nurse Practitioners) who all work together to provide you with the care you need, when you need it.  We recommend signing up for the patient portal called "MyChart".  Sign up information is provided on this After Visit Summary.  MyChart is used to connect with patients for Virtual Visits (Telemedicine).  Patients are able to view lab/test results, encounter notes, upcoming appointments, etc.  Non-urgent messages can be sent to your provider as well.   To learn more about what you can do with MyChart, go to [email or web address].    Your next appointment:   2-3  month(s)  The format for your next appointment:   In Person  Provider:   You may see Julien Nordmann, MD or one of the following Advanced Practice Providers on your designated Care Team:    Nicolasa Ducking, NP  Eula Listen, PA-C  Marisue Ivan, PA-C  Cadence Fransico Michael, [place]  Gillian Shields, NP   Other Instructions  Recommend discussing physical therapy to help regain your strength with Dr. Hyacinth Meeker at your next visit.  Be sure to stay well hydrated and eat regular meals.   Heart Healthy Diet Recommendations: A low-salt diet is recommended. Meats should be grilled, baked, or boiled. Avoid fried foods. Focus on lean protein sources like fish or chicken with vegetables and fruits. The American Heart Association is a Chief Technology Officer!  American Heart Association Diet and Lifeystyle Recommendations   Exercise recommendations: The American Heart Association recommends 150 minutes of moderate intensity exercise weekly. Try 30 minutes of moderate intensity exercise 4-5 times per week. This could include walking, jogging, or swimming.

## 2020-12-30 NOTE — Progress Notes (Signed)
Office Visit    Patient Name: [name] [name]. Date of Encounter: 12/30/2020  PCP:  Danella Penton, MD   [facility]  Cardiologist:  Julien Nordmann, MD  Advanced Practice Provider:  No care team member to display Electrophysiologist:  None   Chief Complaint    [name] [name]. is a 87 y.o. 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 CAD (coronary artery disease)    non-obstructive by cath in 2000 - Myoview in 2008 negative. EF 57% - ETT in 8/10. Walked 9:17 on [place] w no CPor ST-T changes  . CAD (coronary artery disease)   . Diabetes mellitus   . GERD (gastroesophageal reflux disease)   . Glaucoma   . Hyperlipidemia   . Iron deficiency anemia   . Memory loss   . Osteoporosis    Past Surgical History:  Procedure Laterality Date  . ANTERIOR CRUCIATE LIGAMENT REPAIR    . Cath (other)    . colon polyps removed     x2  . INTRAMEDULLARY (IM) NAIL INTERTROCHANTERIC Right 10/22/2019   Procedure: INTRAMEDULLARY (IM) NAIL INTERTROCHANTRIC;  Surgeon: Signa Kell, MD;  Location: [facility] ORS;  Service: Orthopedics;  Laterality: Right;  . removal r eye      Allergies  Allergies  Allergen Reactions  . Morphine And Related Anxiety  . Chlorhexidine   . Hydrocodone-Acetaminophen Rash    History of Present Illness    [name] [name]. is a 87 y.o. 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  He was last seen 01/27/20.[name]  Presents today for follow up with his wife who assists with history  taking. Reports episodes of weakness dating back to 2018. Noted 3 episodes of weakness over the last year. Wife describes him as getting "weak in the nears" and becoming unsteady on his feet. The "spells" have all occurred while walking. He does still walk for exercise but has decreased. Tells me at the end of his walk he feels "worn out" and "faint like". Reports no symptoms of claudication. Tells me he feels lightheaded but not dizzy. No near syncope nor syncope. Notes no shortness of breath nor dyspnea on exertion.   Saw Dr. Sherryll Burger yesterday who is ordering EEG and an MRI and suggested further cardiac workup.   EKGs/Labs/Other Studies Reviewed:   The following studies were reviewed today:   EKG:  EKG is ordered today.  The ekg ordered today demonstrates NSR 71bpm with no acute ST/T wave changes.   Recent Labs: No results found for requested labs within last 8760 hours.  Recent Lipid Panel    Component Value Date/Time   CHOL 88 06/07/2017 1449   TRIG 66 06/07/2017 1449   HDL 36 (L) 06/07/2017 1449   CHOLHDL 2.4 06/07/2017 1449   VLDL 13 06/07/2017 1449   LDLCALC 39 06/07/2017 1449    Home Medications   Current Meds  Medication Sig  . aspirin 81 MG tablet Take 81 mg by mouth daily.  . bisacodyl (DULCOLAX) 10 MG suppository Place 1 suppository (10 mg  total) rectally daily as needed for moderate constipation.  . Calcium 600-200 MG-UNIT tablet Take 1 tablet by mouth daily at 6 (six) AM.  . docusate sodium (COLACE) 100 MG capsule Take 1 capsule (100 mg total) by mouth 2 (two) times daily.  [name] EVENING PRIMROSE OIL PO Take 1 capsule by mouth daily.  [name] galantamine (RAZADYNE) 12 MG tablet Take 12 mg by mouth 2 (two) times daily.   [name] glimepiride (AMARYL) 2 MG tablet Take 2 mg by mouth 2 (two) times daily.   . Glucosamine-Chondroitin 500-400 MG CAPS Take by mouth daily. Take 3 tabs  . insulin aspart (NOVOLOG FLEXPEN) 100 UNIT/ML FlexPen Inject 15 Units into the skin daily before supper.  .  insulin detemir (LEVEMIR) 100 UNIT/ML FlexPen Inject 40 Units into the skin daily.  [name] latanoprost (XALATAN) 0.005 % ophthalmic solution Place 1 drop into the left eye at bedtime.  . metFORMIN (GLUCOPHAGE-XR) 500 MG 24 hr tablet Take 1,000 mg by mouth at bedtime.  . Multiple Vitamin (MULTIVITAMIN) tablet Take 1 tablet by mouth daily.  . NEOMYCIN-POLYMYXIN-HYDROCORTISONE (CORTISPORIN) 1 % SOLN OTIC solution Place 5 drops into both ears daily as needed. Ear wax build up  . NITROSTAT 0.4 MG SL tablet TAKE ONE TABLET UNDER THE TONGUE EVERY 5 MINUTES FOR CHEST PAIN. IF NO RELIEF PAST 3RD TAB GO TO EMERGENCY ROOM  . Omega-3 Fatty Acids (FISH OIL PO) Take 1 capsule by mouth daily.  [name] omeprazole (PRILOSEC) 20 MG capsule Take 20 mg by mouth daily.  [name] oxyCODONE (OXY IR/ROXICODONE) 5 MG immediate release tablet Take 0.5-1 tablets (2.5-5 mg total) by mouth every 4 (four) hours as needed for moderate pain or severe pain (pain score 4-6).  [name] polyethylene glycol (MIRALAX / GLYCOLAX) 17 g packet Take 17 g by mouth daily.  . pramipexole (MIRAPEX) 0.125 MG tablet Take 0.125 mg by mouth at bedtime.  . sertraline (ZOLOFT) 50 MG tablet Take 50 mg by mouth daily.  . simvastatin (ZOCOR) 40 MG tablet Take 40 mg by mouth at bedtime.  . traMADol (ULTRAM) 50 MG tablet Take 1 tablet (50 mg total) by mouth every 6 (six) hours as needed for moderate pain.     Review of Systems  All other systems reviewed and are otherwise negative except as noted above.  Physical Exam    VS:  BP 130/72   Pulse 71   Ht 5\' 9"  (1.753 m)   Wt 182 lb (82.6 kg)   BMI 26.88 kg/m  , BMI Body mass index is 26.88 kg/m.  Wt Readings from Last 3 Encounters:  12/30/20 182 lb (82.6 kg)  01/27/20 176 lb 6 oz (80 kg)  10/22/19 169 lb 15.6 oz (77.1 kg)    GEN: Well nourished, well developed, in no acute distress. HEENT: normal. Neck: Supple, no JVD, carotid bruits, or masses. Cardiac: RRR, no murmurs, rubs, or gallops. No clubbing, cyanosis,  edema.  Radials/DP/PT 2+ and equal bilaterally.  Respiratory:  Respirations regular and unlabored, clear to auscultation bilaterally. GI: Soft, nontender, nondistended. MS: No deformity or atrophy. Skin: Warm and dry, no rash. Neuro:  Strength and sensation are intact. Psych: Normal affect.  Assessment & Plan    1. Fatigue / Dyspnea - Likely etiology deconditioning as his chief complaint today is feeling weaker than usual after walking extended distances. No symptoms concerning for claudication. Echocardiogram to rule out HF or valvular abnormality as contributory. Encouraged to discuss PT with PCP. CBC, CMP, TSH today to rule out etiology anemia,  AKI, thyroid abnormality.  2. Moderate nonobstructive coronary disease - Noted by cath 2000. No chest pain, pressure, tightness. No indication for ischemic evaluation at this time. GDMT includes aspirin, simvastatin.   3. Previous CVA / Dementia - Continue to follow with Dr. Sherryll Burger. Upcoming EEG and possible MRI.  4. DM2 - Continue to follow with endocrinology.   Disposition: Follow up in 2-3 month(s) with Dr. Mariah Milling or APP   Signed, Alver Sorrow, NP 12/30/2020, 12:19 PM [facility]

## 2020-12-31 LAB — CBC
Hematocrit: 45.6 % (ref 37.5–51.0)
Hemoglobin: 15.2 g/dL (ref 13.0–17.7)
MCH: 30.3 pg (ref 26.6–33.0)
MCHC: 33.3 g/dL (ref 31.5–35.7)
MCV: 91 fL (ref 79–97)
Platelets: 216 10*3/uL (ref 150–450)
RBC: 5.02 x10E6/uL (ref 4.14–5.80)
RDW: 12.3 % (ref 11.6–15.4)
WBC: 7.6 10*3/uL (ref 3.4–10.8)

## 2020-12-31 LAB — COMPREHENSIVE METABOLIC PANEL
ALT: 20 IU/L (ref 0–44)
AST: 24 IU/L (ref 0–40)
Albumin/Globulin Ratio: 1.6 (ref 1.2–2.2)
Albumin: 4.3 g/dL (ref 3.6–4.6)
Alkaline Phosphatase: 89 IU/L (ref 44–121)
BUN/Creatinine Ratio: 17 (ref 10–24)
BUN: 13 mg/dL (ref 8–27)
Bilirubin Total: 0.5 mg/dL (ref 0.0–1.2)
CO2: 22 mmol/L (ref 20–29)
Calcium: 10 mg/dL (ref 8.6–10.2)
Chloride: 100 mmol/L (ref 96–106)
Creatinine, Ser: 0.78 mg/dL (ref 0.76–1.27)
Globulin, Total: 2.7 g/dL (ref 1.5–4.5)
Glucose: 144 mg/dL — ABNORMAL HIGH (ref 65–99)
Potassium: 4.9 mmol/L (ref 3.5–5.2)
Sodium: 137 mmol/L (ref 134–144)
Total Protein: 7 g/dL (ref 6.0–8.5)
eGFR: 88 mL/min/{1.73_m2} (ref >59)

## 2020-12-31 LAB — TSH: TSH: 0.985 u[IU]/mL (ref 0.450–4.500)

## 2021-01-20 ENCOUNTER — Ambulatory Visit (INDEPENDENT_AMBULATORY_CARE_PROVIDER_SITE_OTHER): Payer: Medicare PPO

## 2021-01-20 ENCOUNTER — Other Ambulatory Visit: Payer: Self-pay

## 2021-01-20 DIAGNOSIS — R0600 Dyspnea, unspecified: Secondary | ICD-10-CM | POA: Diagnosis not present

## 2021-01-20 DIAGNOSIS — R5383 Other fatigue: Secondary | ICD-10-CM | POA: Diagnosis not present

## 2021-01-20 DIAGNOSIS — R531 Weakness: Secondary | ICD-10-CM

## 2021-01-20 DIAGNOSIS — R0609 Other forms of dyspnea: Secondary | ICD-10-CM

## 2021-01-20 DIAGNOSIS — R42 Dizziness and giddiness: Secondary | ICD-10-CM

## 2021-01-20 LAB — ECHOCARDIOGRAM COMPLETE
AR max vel: 2.34 cm2
AV Area VTI: 2.95 cm2
AV Area mean vel: 2.68 cm2
AV Mean grad: 3 mmHg
AV Peak grad: 8.1 mmHg
Ao pk vel: 1.42 m/s
Area-P 1/2: 2.79 cm2
Calc EF: 69.2 %
S' Lateral: 3.3 cm
Single Plane A2C EF: 64.4 %
Single Plane A4C EF: 70.6 %

## 2021-03-20 NOTE — Progress Notes (Deleted)
Cardiology Office Note  Date:  03/20/2021   ID:  [name] Graven., DOB [birth year], MRN [id number]  PCP:  Danella Penton, MD   No chief complaint on file.   HPI:  [name] [name] is a 87 year-old male with  >20-year history of diabetes  hemoglobin A1c around 7.5,  hyperlipidemia,  cardiac catheterization in 2000, moderate nonobstructive coronary disease with a 50% LAD lesion and a 60% lesion in his right coronary artery. presents for routine followup of his coronary artery disease  In the hospital after slip and fall 10/2019 Had delerium likely exacerbated by Seroquel and Ativan Right hip fracture Had difficult time recovering secondary to delirium Had to get sitters at home in recovery  Hip doing "fair", uses a cane  He is still driving "crazy doctors trying to take my license" Reports he was seen by neurology, they did not want him driving  Walks the block daily, Reading blocks Hearing worse  Wife presents today, Old CVA affected cognitive issues  MR 2018: Old small LEFT cerebellar infarct. Moderate to severe parenchymal brain volume loss. Moderate to severe chronic small vessel ischemic disease.  Labs  HBA1C 8.0, on extra insulin before dinner Total chol 101, LDL 51  EKG personally reviewed by myself on todays visit NSR no St or T wave changes, rate 81  Other past medical history reviewed Prior history atypical chest pain overnight when getting ready for bed  Memory problems  difficulty with numbers, directions Followed by neurology  Prior trip to [place] Acted "funny on the plane "collapsed" the next day walking in the heat Evaluation in the hospital MRI    Myoview  April 2008 showed normal LV function, EF of 57% with no ischemia or infarction.     CP Several years ago and underwent ETT in 8/10. Walked 9:17 on [place] with no CP or ST-T changes.   PMH:   has a past medical history of CAD (coronary artery disease), CAD (coronary artery disease), Diabetes  mellitus, GERD (gastroesophageal reflux disease), Glaucoma, Hyperlipidemia, Iron deficiency anemia, Memory loss, and Osteoporosis.  PSH:    Past Surgical History:  Procedure Laterality Date   ANTERIOR CRUCIATE LIGAMENT REPAIR     Cath (other)     colon polyps removed     x2   INTRAMEDULLARY (IM) NAIL INTERTROCHANTERIC Right 10/22/2019   Procedure: INTRAMEDULLARY (IM) NAIL INTERTROCHANTRIC;  Surgeon: Signa Kell, MD;  Location: [facility] ORS;  Service: Orthopedics;  Laterality: Right;   removal r eye      Current Outpatient Medications  Medication Sig Dispense Refill   aspirin 81 MG tablet Take 81 mg by mouth daily.     bisacodyl (DULCOLAX) 10 MG suppository Place 1 suppository (10 mg total) rectally daily as needed for moderate constipation.  0   Calcium 600-200 MG-UNIT tablet Take 1 tablet by mouth daily at 6 (six) AM.     docusate sodium (COLACE) 100 MG capsule Take 1 capsule (100 mg total) by mouth 2 (two) times daily.  0   EVENING PRIMROSE OIL PO Take 1 capsule by mouth daily.     galantamine (RAZADYNE) 12 MG tablet Take 12 mg by mouth 2 (two) times daily.      glimepiride (AMARYL) 2 MG tablet Take 2 mg by mouth 2 (two) times daily.      Glucosamine-Chondroitin 500-400 MG CAPS Take by mouth daily. Take 3 tabs     insulin aspart (NOVOLOG FLEXPEN) 100 UNIT/ML FlexPen Inject 15 Units into the skin daily  before supper.     insulin detemir (LEVEMIR) 100 UNIT/ML FlexPen Inject 40 Units into the skin daily.     latanoprost (XALATAN) 0.005 % ophthalmic solution Place 1 drop into the left eye at bedtime.     metFORMIN (GLUCOPHAGE-XR) 500 MG 24 hr tablet Take 1,000 mg by mouth at bedtime.     Multiple Vitamin (MULTIVITAMIN) tablet Take 1 tablet by mouth daily.     NEOMYCIN-POLYMYXIN-HYDROCORTISONE (CORTISPORIN) 1 % SOLN OTIC solution Place 5 drops into both ears daily as needed. Ear wax build up     NITROSTAT 0.4 MG SL tablet TAKE ONE TABLET UNDER THE TONGUE EVERY 5 MINUTES FOR CHEST PAIN. IF NO  RELIEF PAST 3RD TAB GO TO EMERGENCY ROOM 25 tablet 1   Omega-3 Fatty Acids (FISH OIL PO) Take 1 capsule by mouth daily.     omeprazole (PRILOSEC) 20 MG capsule Take 20 mg by mouth daily.     oxyCODONE (OXY IR/ROXICODONE) 5 MG immediate release tablet Take 0.5-1 tablets (2.5-5 mg total) by mouth every 4 (four) hours as needed for moderate pain or severe pain (pain score 4-6). 30 tablet 0   polyethylene glycol (MIRALAX / GLYCOLAX) 17 g packet Take 17 g by mouth daily.  0   pramipexole (MIRAPEX) 0.125 MG tablet Take 0.125 mg by mouth at bedtime.     sertraline (ZOLOFT) 50 MG tablet Take 50 mg by mouth daily.     simvastatin (ZOCOR) 40 MG tablet Take 40 mg by mouth at bedtime.     traMADol (ULTRAM) 50 MG tablet Take 1 tablet (50 mg total) by mouth every 6 (six) hours as needed for moderate pain. 30 tablet 0   No current facility-administered medications for this visit.     Allergies:   Morphine and related, Chlorhexidine, and Hydrocodone-acetaminophen   Social History:  The patient  reports that he has never smoked. He has never used smokeless tobacco. He reports current alcohol use. He reports that he does not use drugs.   Family History:   family history includes Arthritis in his sister; Heart attack in his father; Stroke in his mother.    Review of Systems: Review of Systems  Constitutional: Negative.   Respiratory: Negative.    Cardiovascular: Negative.   Gastrointestinal: Negative.   Musculoskeletal: Negative.   Neurological: Negative.   Psychiatric/Behavioral:  Positive for memory loss.   All other systems reviewed and are negative.   PHYSICAL EXAM: VS:  There were no vitals taken for this visit. , BMI There is no height or weight on file to calculate BMI. Constitutional:  oriented to person, place, and time. No distress.  HENT:  Head: Grossly normal Eyes:  no discharge. No scleral icterus.  Neck: No JVD, no carotid bruits  Cardiovascular: Regular rate and rhythm, no murmurs  appreciated Pulmonary/Chest: Clear to auscultation bilaterally, no wheezes or rails Abdominal: Soft.  no distension.  no tenderness.  Musculoskeletal: Normal range of motion Neurological:  normal muscle tone. Coordination normal. No atrophy Skin: Skin warm and dry Psychiatric: normal affect, pleasant   Recent Labs: 12/30/2020: ALT 20; BUN 13; Creatinine, Ser 0.78; Hemoglobin 15.2; Platelets 216; Potassium 4.9; Sodium 137; TSH 0.985    Lipid Panel Lab Results  Component Value Date   CHOL 88 06/07/2017   HDL 36 (L) 06/07/2017   LDLCALC 39 06/07/2017   TRIG 66 06/07/2017      Wt Readings from Last 3 Encounters:  12/30/20 182 lb (82.6 kg)  01/27/20 176 lb 6 oz (  80 kg)  10/22/19 169 lb 15.6 oz (77.1 kg)       ASSESSMENT AND PLAN:  Atherosclerosis of native coronary artery of native heart with stable angina pectoris (HCC) -  Currently with no symptoms of angina. No further workup at this time. Continue current medication regimen.  Type 2 diabetes mellitus with other circulatory complication, without long-term current use of insulin (HCC)  Managed by primary care and endocrinology Diabetes numbers are higher A1c of 8  PAD (peripheral artery disease) (HCC) - Plan: EKG 12-Lead Cholesterol at goal, stressed importance of working on his diabetes  Mixed hyperlipidemia -  Cholesterol is at goal on the current lipid regimen. No changes to the medications were made.  Dementia Followed by neurology, some memory issues Will defer to neurology whether he should be driving Wife feels delirium as demonstrated in the hospital earlier in the year has improved, now at his baseline    Total encounter time more than 25 minutes  Greater than 50% was spent in counseling and coordination of care with the patient  Disposition:   F/U  12 months   No orders of the defined types were placed in this encounter.    Signed, Dossie Arbour, M.D., Ph.D. 03/20/2021  [facility]  [place], [place] [phone]

## 2021-03-21 ENCOUNTER — Ambulatory Visit: Payer: Medicare PPO | Admitting: Cardiovascular Disease

## 2021-03-23 ENCOUNTER — Ambulatory Visit: Payer: Medicare PPO | Admitting: Cardiovascular Disease

## 2021-05-10 ENCOUNTER — Ambulatory Visit: Payer: Medicare PPO | Admitting: Cardiovascular Disease

## 2021-07-20 NOTE — Progress Notes (Signed)
Cardiology Office Note  Date:  07/22/2021   ID:  [name] Graven., DOB [birth year], MRN [id number]  PCP:  Danella Penton, MD   Chief Complaint  Patient presents with   2-3 month follow up     HPI:  [name] [name] is a 87 year-old male with  >20-year history of diabetes   hyperlipidemia,  cardiac catheterization in 2000, moderate nonobstructive coronary disease with a 50% LAD lesion and a 60% lesion in his right coronary artery. presents for routine followup of his coronary artery disease  Last office visit with myself May 2021 Seen by one of our providers April 2022 At that time was getting weaker, followed by neurology, EEG and MRI was being ordered  Echocardiogram May 2022  normal LV function, EF 60%  Occasionally with atypical chest discomfort Symptoms nonspecific, no reproducibility Wife not particularly concerned  Wife presents today, Old CVA affected cognitive issues Wife feels cognitive is getting worse  Lab work reviewed Total chol 118, LDL 64  EKG personally reviewed by myself on todays visit Normal sinus rhythm rate 80 bpm no significant ST-T wave changes  In the hospital after slip and fall 10/2019 Had delerium likely exacerbated by Seroquel and Ativan Right hip fracture Had difficult time recovering secondary to delirium Had to get sitters at home in recovery  MR 2018: Old small LEFT cerebellar infarct. Moderate to severe parenchymal brain volume loss. Moderate to severe chronic small vessel ischemic disease.  Labs  HBA1C 8.5, on extra insulin before dinner Total chol 118, LDL 64  EKG personally reviewed by myself on todays visit NSR no St or T wave changes, rate 81  Other past medical history reviewed Prior history atypical chest pain overnight when getting ready for bed  Memory problems  difficulty with numbers, directions Followed by neurology  Prior trip to [place] Acted "funny on the plane "collapsed" the next day walking in the  heat Evaluation in the hospital MRI    Myoview  April 2008 showed normal LV function, EF of 57% with no ischemia or infarction.      PMH:   has a past medical history of CAD (coronary artery disease), CAD (coronary artery disease), Diabetes mellitus, GERD (gastroesophageal reflux disease), Glaucoma, Hyperlipidemia, Iron deficiency anemia, Memory loss, and Osteoporosis.  PSH:    Past Surgical History:  Procedure Laterality Date   ANTERIOR CRUCIATE LIGAMENT REPAIR     Cath (other)     colon polyps removed     x2   INTRAMEDULLARY (IM) NAIL INTERTROCHANTERIC Right 10/22/2019   Procedure: INTRAMEDULLARY (IM) NAIL INTERTROCHANTRIC;  Surgeon: Signa Kell, MD;  Location: [facility] ORS;  Service: Orthopedics;  Laterality: Right;   removal r eye      Current Outpatient Medications  Medication Sig Dispense Refill   aspirin 81 MG tablet Take 81 mg by mouth daily.     bisacodyl (DULCOLAX) 10 MG suppository Place 1 suppository (10 mg total) rectally daily as needed for moderate constipation.  0   Calcium 600-200 MG-UNIT tablet Take 1 tablet by mouth daily at 6 (six) AM.     docusate sodium (COLACE) 100 MG capsule Take 1 capsule (100 mg total) by mouth 2 (two) times daily.  0   EVENING PRIMROSE OIL PO Take 1 capsule by mouth daily.     galantamine (RAZADYNE) 12 MG tablet Take 12 mg by mouth 2 (two) times daily.      glimepiride (AMARYL) 2 MG tablet Take 2 mg by mouth 2 (two) times  daily.      Glucosamine-Chondroitin 500-400 MG CAPS Take by mouth daily. Take 3 tabs     insulin aspart (NOVOLOG FLEXPEN) 100 UNIT/ML FlexPen Inject 15 Units into the skin daily before supper.     insulin degludec (TRESIBA FLEXTOUCH) 100 UNIT/ML FlexTouch Pen Inject 40 Units into the skin at bedtime.     latanoprost (XALATAN) 0.005 % ophthalmic solution Place 1 drop into the left eye at bedtime.     melatonin 1 MG TABS tablet Take by mouth.     metFORMIN (GLUCOPHAGE-XR) 500 MG 24 hr tablet Take 1,000 mg by mouth at bedtime.      Multiple Vitamin (MULTIVITAMIN) tablet Take 1 tablet by mouth daily.     NEOMYCIN-POLYMYXIN-HYDROCORTISONE (CORTISPORIN) 1 % SOLN OTIC solution Place 5 drops into both ears daily as needed. Ear wax build up     NITROSTAT 0.4 MG SL tablet TAKE ONE TABLET UNDER THE TONGUE EVERY 5 MINUTES FOR CHEST PAIN. IF NO RELIEF PAST 3RD TAB GO TO EMERGENCY ROOM 25 tablet 1   Omega-3 Fatty Acids (FISH OIL PO) Take 1 capsule by mouth daily.     omeprazole (PRILOSEC) 20 MG capsule Take 20 mg by mouth daily.     oxyCODONE (OXY IR/ROXICODONE) 5 MG immediate release tablet Take 0.5-1 tablets (2.5-5 mg total) by mouth every 4 (four) hours as needed for moderate pain or severe pain (pain score 4-6). 30 tablet 0   polyethylene glycol (MIRALAX / GLYCOLAX) 17 g packet Take 17 g by mouth daily.  0   pramipexole (MIRAPEX) 0.125 MG tablet Take 0.125 mg by mouth at bedtime.     sertraline (ZOLOFT) 50 MG tablet Take 50 mg by mouth daily.     simvastatin (ZOCOR) 40 MG tablet Take 40 mg by mouth at bedtime.     traMADol (ULTRAM) 50 MG tablet Take 1 tablet (50 mg total) by mouth every 6 (six) hours as needed for moderate pain. 30 tablet 0   insulin detemir (LEVEMIR) 100 UNIT/ML FlexPen Inject 40 Units into the skin daily. (Patient not taking: Reported on 07/22/2021)     No current facility-administered medications for this visit.     Allergies:   Morphine and related, Chlorhexidine, and Hydrocodone-acetaminophen   Social History:  The patient  reports that he has never smoked. He has never used smokeless tobacco. He reports current alcohol use. He reports that he does not use drugs.   Family History:   family history includes Arthritis in his sister; Heart attack in his father; Stroke in his mother.    Review of Systems: Review of Systems  Constitutional: Negative.   Respiratory: Negative.    Cardiovascular: Negative.   Gastrointestinal: Negative.   Musculoskeletal: Negative.   Neurological: Negative.    Psychiatric/Behavioral:  Positive for memory loss.   All other systems reviewed and are negative.   PHYSICAL EXAM: VS:  BP 128/60 (BP Location: Left Arm, Patient Position: Sitting, Cuff Size: Normal)   Pulse 80   Ht 6' (1.829 m)   Wt 185 lb (83.9 kg)   SpO2 98%   BMI 25.09 kg/m  , BMI Body mass index is 25.09 kg/m. Constitutional:  oriented to person, place, and time. No distress.  HENT:  Head: Grossly normal Eyes:  no discharge. No scleral icterus.  Neck: No JVD, no carotid bruits  Cardiovascular: Regular rate and rhythm, no murmurs appreciated Pulmonary/Chest: Clear to auscultation bilaterally, no wheezes or rails Abdominal: Soft.  no distension.  no tenderness.  Musculoskeletal:  Normal range of motion Neurological:  normal muscle tone. Coordination normal. No atrophy full exam not performed Skin: Skin warm and dry Psychiatric: normal affect, pleasant   Recent Labs: 12/30/2020: ALT 20; BUN 13; Creatinine, Ser 0.78; Hemoglobin 15.2; Platelets 216; Potassium 4.9; Sodium 137; TSH 0.985    Lipid Panel Lab Results  Component Value Date   CHOL 88 06/07/2017   HDL 36 (L) 06/07/2017   LDLCALC 39 06/07/2017   TRIG 66 06/07/2017      Wt Readings from Last 3 Encounters:  07/22/21 185 lb (83.9 kg)  12/30/20 182 lb (82.6 kg)  01/27/20 176 lb 6 oz (80 kg)       ASSESSMENT AND PLAN:  Atherosclerosis of native coronary artery of native heart with stable angina pectoris (HCC) -  Currently with no symptoms of angina. No further workup at this time. Continue current medication regimen.  Type 2 diabetes mellitus with other circulatory complication, without long-term current use of insulin (HCC)  Managed by primary care and endocrinology A1c of 8.5 Dietary discretion recommended  PAD (peripheral artery disease) (HCC) - Plan: EKG 12-Lead Cholesterol at goal, Recommend he continue to work on his diabetes  Mixed hyperlipidemia -  Cholesterol is at goal on the current lipid  regimen.  No changes to the medications were made.  Dementia Followed by neurology, some memory issues No longer driving Wife supportive, does most ADLs around the house    Total encounter time more than 25 minutes  Greater than 50% was spent in counseling and coordination of care with the patient   Orders Placed This Encounter  Procedures   EKG 12-Lead     Signed, Dossie Arbour, M.D., Ph.D. 07/22/2021  [facility] [place], [place] [phone]

## 2021-07-22 ENCOUNTER — Ambulatory Visit: Payer: Medicare PPO | Admitting: Cardiovascular Disease

## 2021-07-22 ENCOUNTER — Other Ambulatory Visit: Payer: Self-pay

## 2021-07-22 ENCOUNTER — Encounter: Payer: Self-pay | Admitting: Cardiovascular Disease

## 2021-07-22 VITALS — BP 128/60 | HR 80 | Ht 72.0 in | Wt 185.0 lb

## 2021-07-22 DIAGNOSIS — R0609 Other forms of dyspnea: Secondary | ICD-10-CM

## 2021-07-22 DIAGNOSIS — I739 Peripheral vascular disease, unspecified: Secondary | ICD-10-CM | POA: Diagnosis not present

## 2021-07-22 DIAGNOSIS — E1159 Type 2 diabetes mellitus with other circulatory complications: Secondary | ICD-10-CM

## 2021-07-22 DIAGNOSIS — E782 Mixed hyperlipidemia: Secondary | ICD-10-CM

## 2021-07-22 DIAGNOSIS — I2510 Atherosclerotic heart disease of native coronary artery without angina pectoris: Secondary | ICD-10-CM | POA: Diagnosis not present

## 2021-07-22 DIAGNOSIS — I25118 Atherosclerotic heart disease of native coronary artery with other forms of angina pectoris: Secondary | ICD-10-CM

## 2021-07-22 MED ORDER — NITROSTAT 0.4 MG SL SUBL: SUBLINGUAL_TABLET | SUBLINGUAL | 3 refills | Status: DC

## 2021-07-22 NOTE — Patient Instructions (Addendum)
Medication Instructions:  No changes  If you need a refill on your cardiac medications before your next appointment, please call your pharmacy.   Lab work: No new labs needed  Testing/Procedures: No new testing needed  Follow-Up: At [facility], you and your health needs are our priority.  As part of our continuing mission to provide you with exceptional heart care, we have created designated Provider Care Teams.  These Care Teams include your primary Cardiologist (physician) and Advanced Practice Providers (APPs -  Physician Assistants and Nurse Practitioners) who all work together to provide you with the care you need, when you need it.  You will need a follow up appointment in 12 months  Providers on your designated Care Team:   Christopher Berge, NP Ryan Dunn, PA-C Cadence Furth, PA-C  COVID-19 Vaccine Information can be found at: [email or web address] For questions related to vaccine distribution or appointments, please email [email or web address] or call [phone].   

## 2021-08-02 MED ORDER — NITROGLYCERIN 0.4 MG SL SUBL: 0.4000 mg | SUBLINGUAL_TABLET | SUBLINGUAL | 3 refills | Status: DC | PRN

## 2021-08-02 MED ORDER — NITROGLYCERIN 0.4 MG SL SUBL: 0.4000 mg | SUBLINGUAL_TABLET | SUBLINGUAL | 3 refills | Status: AC | PRN

## 2021-08-02 NOTE — Addendum Note (Signed)
Addended by: Maple Hudson on: 08/02/2021 05:02 PM   Modules accepted: Orders

## 2021-08-02 NOTE — Addendum Note (Signed)
Addended by: Maple Hudson on: 08/02/2021 05:04 PM   Modules accepted: Orders

## 2022-01-30 ENCOUNTER — Ambulatory Visit
Admission: RE | Admit: 2022-01-30 | Discharge: 2022-01-30 | Disposition: A | Discharge status: Home / Self Care | Payer: Medicare PPO | Source: Ambulatory Visit | Attending: Emergency Medicine | Admitting: Emergency Medicine

## 2022-01-30 VITALS — BP 147/80 | HR 90 | Temp 98.1°F | Resp 18

## 2022-01-30 DIAGNOSIS — E1165 Type 2 diabetes mellitus with hyperglycemia: Secondary | ICD-10-CM | POA: Diagnosis not present

## 2022-01-30 DIAGNOSIS — R739 Hyperglycemia, unspecified: Secondary | ICD-10-CM

## 2022-01-30 DIAGNOSIS — R410 Disorientation, unspecified: Secondary | ICD-10-CM | POA: Diagnosis not present

## 2022-01-30 DIAGNOSIS — R350 Frequency of micturition: Secondary | ICD-10-CM

## 2022-01-30 LAB — POCT URINALYSIS DIP (MANUAL ENTRY)
Bilirubin, UA: NEGATIVE
Blood, UA: NEGATIVE
Glucose, UA: 500 mg/dL — AB
Ketones, POC UA: NEGATIVE mg/dL
Leukocytes, UA: NEGATIVE
Nitrite, UA: NEGATIVE
Spec Grav, UA: >=1.03 — AB (ref 1.010–1.025)
Urobilinogen, UA: 1 E.U./dL
pH, UA: 6 (ref 5.0–8.0)

## 2022-01-30 LAB — POCT FASTING CBG KUC MANUAL ENTRY: POCT Glucose (KUC): 214 mg/dL — AB (ref 70–99)

## 2022-01-30 NOTE — ED Triage Notes (Addendum)
Pt reports more confusion than normal, urinary frequency and malodorous urine. Wife also reports pt have been off Zoloft x 2 weeks and spoke with NP yesterday who advised to take a half dose of zoloft last night and this morning. Also reports coughing up a white "foamy" substance x 6 months.

## 2022-01-30 NOTE — Discharge Instructions (Addendum)
Take your husband to the emergency department if he has acute confusion or other concerning symptoms.  Follow-up with his primary care provider tomorrow.

## 2022-01-30 NOTE — ED Provider Notes (Signed)
[name] [name]    CSN: [id number] Arrival date & time: 01/30/22  4098      History   Chief Complaint Chief Complaint  Patient presents with   Urinary Frequency    May have UTI. More confusion than normal. - Entered by patient   Cough    HPI [name] Mostafa. is a 88 y.o. male.  Accompanied by his wife, patient presents with urinary frequency, malodorous urine, increased confusion x2 days.  No falls or injury.  Patient's wife reports he became acutely confused during the evening of 01/28/2022.  He was unable to find his bedroom at home.  He also had mild confusion during the day on 01/29/2022 and has remained more confused than usual per his wife.  Patient was recently tapered off Zoloft 2 weeks ago; his wife restarted it on 01/29/2022 at 25 mg dose daily.  She reports chronic cough x6 months.  She reports the patient has had no fever or other symptoms.  His medical history includes memory loss, diabetes, peripheral artery disease, coronary artery disease, hyperlipidemia.  The history is provided by the spouse, medical records and the patient.   Past Medical History:  Diagnosis Date   CAD (coronary artery disease)    non-obstructive by cath in 2000 - Myoview in 2008 negative. EF 57% - ETT in 8/10. Walked 9:17 on [place] w no CPor ST-T changes   CAD (coronary artery disease)    Diabetes mellitus    GERD (gastroesophageal reflux disease)    Glaucoma    Hyperlipidemia    Iron deficiency anemia    Memory loss    Osteoporosis     Patient Active Problem List   Diagnosis Date Noted   Hip fracture (HCC) 10/21/2019   Weakness 06/07/2017   PAD (peripheral artery disease) (HCC) 09/07/2014   Diabetes mellitus (HCC) 09/21/2011   Hyperlipidemia 03/25/2009   CAD, NATIVE VESSEL 03/25/2009   CHEST PAIN-PRECORDIAL 03/25/2009    Past Surgical History:  Procedure Laterality Date   ANTERIOR CRUCIATE LIGAMENT REPAIR     Cath (other)     colon polyps removed     x2   INTRAMEDULLARY  (IM) NAIL INTERTROCHANTERIC Right 10/22/2019   Procedure: INTRAMEDULLARY (IM) NAIL INTERTROCHANTRIC;  Surgeon: Signa Kell, MD;  Location: [facility] ORS;  Service: Orthopedics;  Laterality: Right;   removal r eye         Home Medications    Prior to Admission medications   Medication Sig Start Date End Date Taking? Authorizing Provider  aspirin 81 MG tablet Take 81 mg by mouth daily.    [provider]  bisacodyl (DULCOLAX) 10 MG suppository Place 1 suppository (10 mg total) rectally daily as needed for moderate constipation. 10/28/19   Darlin Priestly, MD  Calcium 600-200 MG-UNIT tablet Take 1 tablet by mouth daily at 6 (six) AM.    [provider]  docusate sodium (COLACE) 100 MG capsule Take 1 capsule (100 mg total) by mouth 2 (two) times daily. 10/28/19   Darlin Priestly, MD  EVENING PRIMROSE OIL PO Take 1 capsule by mouth daily.    [provider]  galantamine (RAZADYNE) 12 MG tablet Take 12 mg by mouth 2 (two) times daily.     [provider]  glimepiride (AMARYL) 2 MG tablet Take 2 mg by mouth 2 (two) times daily.     [provider]  Glucosamine-Chondroitin 500-400 MG CAPS Take by mouth daily. Take 3 tabs    [provider]  insulin  aspart (NOVOLOG FLEXPEN) 100 UNIT/ML FlexPen Inject 15 Units into the skin daily before supper.    [provider]  insulin degludec (TRESIBA FLEXTOUCH) 100 UNIT/ML FlexTouch Pen Inject 40 Units into the skin at bedtime.    [provider]  insulin detemir (LEVEMIR) 100 UNIT/ML FlexPen Inject 40 Units into the skin daily. Patient not taking: Reported on 07/22/2021    [provider]  latanoprost (XALATAN) 0.005 % ophthalmic solution Place 1 drop into the left eye at bedtime.    [provider]  melatonin 1 MG TABS tablet Take by mouth.    [provider]  metFORMIN (GLUCOPHAGE-XR) 500 MG 24 hr tablet Take 1,000 mg by mouth at bedtime. 04/30/19   [provider]   Multiple Vitamin (MULTIVITAMIN) tablet Take 1 tablet by mouth daily.    [provider]  NEOMYCIN-POLYMYXIN-HYDROCORTISONE (CORTISPORIN) 1 % SOLN OTIC solution Place 5 drops into both ears daily as needed. Ear wax build up 06/20/19   [provider]  nitroGLYCERIN (NITROSTAT) 0.4 MG SL tablet Place 1 tablet (0.4 mg total) under the tongue every 5 (five) minutes as needed for chest pain. After 3rd nitro within 15 mins, seek emergency services. 08/02/21 10/31/21  Antonieta Iba, MD  Omega-3 Fatty Acids (FISH OIL PO) Take 1 capsule by mouth daily.    [provider]  omeprazole (PRILOSEC) 20 MG capsule Take 20 mg by mouth daily.    [provider]  oxyCODONE (OXY IR/ROXICODONE) 5 MG immediate release tablet Take 0.5-1 tablets (2.5-5 mg total) by mouth every 4 (four) hours as needed for moderate pain or severe pain (pain score 4-6). 10/23/19   Dedra Skeens, PA-C  polyethylene glycol (MIRALAX / GLYCOLAX) 17 g packet Take 17 g by mouth daily. 10/28/19   Darlin Priestly, MD  pramipexole (MIRAPEX) 0.125 MG tablet Take 0.125 mg by mouth at bedtime. 09/23/19   [provider]  sertraline (ZOLOFT) 50 MG tablet Take 50 mg by mouth daily.    [provider]  simvastatin (ZOCOR) 40 MG tablet Take 40 mg by mouth at bedtime.    [provider]  traMADol (ULTRAM) 50 MG tablet Take 1 tablet (50 mg total) by mouth every 6 (six) hours as needed for moderate pain. 10/23/19   Dedra Skeens, PA-C    Family History Family History  Problem Relation Age of Onset   Stroke Mother    Heart attack Father    Arthritis Sister     Social History Social History   Tobacco Use   Smoking status: Never   Smokeless tobacco: Never  Vaping Use   Vaping Use: Never used  Substance Use Topics   Alcohol use: Yes    Alcohol/week: 0.0 - 1.0 standard drinks   Drug use: No     Allergies   Morphine and related, Chlorhexidine, and Hydrocodone-acetaminophen   Review of  Systems Review of Systems  Constitutional:  Negative for chills and fever.  Respiratory:  Negative for cough and shortness of breath.   Cardiovascular:  Negative for chest pain and palpitations.  Gastrointestinal:  Negative for abdominal pain, diarrhea and vomiting.  Genitourinary:  Positive for frequency. Negative for dysuria and hematuria.  Neurological:  Negative for seizures and syncope.  Psychiatric/Behavioral:  Positive for confusion.   All other systems reviewed and are negative.   Physical Exam Triage Vital Signs ED Triage Vitals  Enc Vitals Group     BP      Pulse  Resp      Temp      Temp src      SpO2      Weight      Height      Head Circumference      Peak Flow      Pain Score      Pain Loc      Pain Edu?      Excl. in GC?    No data found.  Updated Vital Signs BP (!) 147/80   Pulse 90   Temp 98.1 F (36.7 C)   Resp 18   SpO2 97%   Visual Acuity Right Eye Distance:   Left Eye Distance:   Bilateral Distance:    Right Eye Near:   Left Eye Near:    Bilateral Near:     Physical Exam Vitals and nursing note reviewed.  Constitutional:      General: He is not in acute distress.    Appearance: He is well-developed. He is not ill-appearing.  HENT:     Mouth/Throat:     Mouth: Mucous membranes are moist.  Cardiovascular:     Rate and Rhythm: Normal rate and regular rhythm.     Heart sounds: Normal heart sounds.  Pulmonary:     Effort: Pulmonary effort is normal. No respiratory distress.     Breath sounds: Normal breath sounds.  Musculoskeletal:     Cervical back: Neck supple.  Skin:    General: Skin is warm and dry.  Neurological:     Mental Status: He is alert.     Comments: Alert, oriented to self. Cooperative with exam.  Psychiatric:        Mood and Affect: Mood normal.        Behavior: Behavior normal.     UC Treatments / Results  Labs (all labs ordered are listed, but only abnormal results are displayed) Labs Reviewed  POCT  URINALYSIS DIP (MANUAL ENTRY) - Abnormal; Notable for the following components:      Result Value   Glucose, UA =500 (*)    Spec Grav, UA >=1.030 (*)    Protein Ur, POC trace (*)    All other components within normal limits  POCT FASTING CBG [facility] MANUAL ENTRY - Abnormal; Notable for the following components:   POCT Glucose ([facility]) 214 (*)    All other components within normal limits    EKG   Radiology No results found.  Procedures Procedures (including critical care time)  Medications Ordered in UC Medications - No data to display  Initial Impression / Assessment and Plan / UC Course  I have reviewed the triage vital signs and the nursing notes.  Pertinent labs & imaging results that were available during my care of the patient were reviewed by me and considered in my medical decision making (see chart for details).  Confusion, urinary frequency, hyperglycemia.  Patient's wife declines transfer to ED at this time; she prefers to follow up with his PCP tomorrow.  Urine does not show signs of infection.  CBG 214; patient ate breakfast.  Discussed with patient's wife that she should take the patient to the ED if he has acute confusion or other concerning symptoms.  Instructed her to follow up with his PCP tomorrow.  She agrees to plan of care.    Final Clinical Impressions(s) / UC Diagnoses   Final diagnoses:  Confusion  Urinary frequency  Hyperglycemia     Discharge Instructions      Take  your husband to the emergency department if he has acute confusion or other concerning symptoms.  Follow-up with his primary care provider tomorrow.     ED Prescriptions   None    PDMP not reviewed this encounter.   Mickie Bailate, Rubi Tooley H, NP 01/30/22 [phone]

## 2022-07-29 NOTE — Progress Notes (Deleted)
Cardiology Office Note  Date:  07/29/2022   ID:  [name]., DOB [birth year], MRN [id number]  PCP:  Danella Penton, MD   No chief complaint on file.   HPI:  Mr. Delacruz is a 88 year-old male with  >20-year history of diabetes   hyperlipidemia,  cardiac catheterization in 2000, moderate nonobstructive coronary disease with a 50% LAD lesion and a 60% lesion in his right coronary artery. presents for routine followup of his coronary artery disease  Last office visit with myself November 2022  followed by neurology, EEG and MRI was being ordered  Echocardiogram May 2022  normal LV function, EF 60%  Occasionally with atypical chest discomfort Symptoms nonspecific, no reproducibility Wife not particularly concerned  Wife presents today, Old CVA affected cognitive issues Wife feels cognitive is getting worse  Lab work reviewed Total chol 118, LDL 64  EKG personally reviewed by myself on todays visit Normal sinus rhythm rate 80 bpm no significant ST-T wave changes  In the hospital after slip and fall 10/2019 Had delerium likely exacerbated by Seroquel and Ativan Right hip fracture Had difficult time recovering secondary to delirium Had to get sitters at home in recovery  MR 2018: Old small LEFT cerebellar infarct. Moderate to severe parenchymal brain volume loss. Moderate to severe chronic small vessel ischemic disease.  Labs  HBA1C 8.5, on extra insulin before dinner Total chol 118, LDL 64  EKG personally reviewed by myself on todays visit NSR no St or T wave changes, rate 81  Other past medical history reviewed Prior history atypical chest pain overnight when getting ready for bed  Memory problems  difficulty with numbers, directions Followed by neurology  Prior trip to [place] Acted "funny on the plane "collapsed" the next day walking in the heat Evaluation in the hospital MRI    Myoview  April 2008 showed normal LV function, EF of 57% with no ischemia  or infarction.      PMH:   has a past medical history of CAD (coronary artery disease), CAD (coronary artery disease), Diabetes mellitus, GERD (gastroesophageal reflux disease), Glaucoma, Hyperlipidemia, Iron deficiency anemia, Memory loss, and Osteoporosis.  PSH:    Past Surgical History:  Procedure Laterality Date   ANTERIOR CRUCIATE LIGAMENT REPAIR     Cath (other)     colon polyps removed     x2   INTRAMEDULLARY (IM) NAIL INTERTROCHANTERIC Right 10/22/2019   Procedure: INTRAMEDULLARY (IM) NAIL INTERTROCHANTRIC;  Surgeon: Signa Kell, MD;  Location: [facility] ORS;  Service: Orthopedics;  Laterality: Right;   removal r eye      Current Outpatient Medications  Medication Sig Dispense Refill   aspirin 81 MG tablet Take 81 mg by mouth daily.     bisacodyl (DULCOLAX) 10 MG suppository Place 1 suppository (10 mg total) rectally daily as needed for moderate constipation.  0   Calcium 600-200 MG-UNIT tablet Take 1 tablet by mouth daily at 6 (six) AM.     docusate sodium (COLACE) 100 MG capsule Take 1 capsule (100 mg total) by mouth 2 (two) times daily.  0   EVENING PRIMROSE OIL PO Take 1 capsule by mouth daily.     galantamine (RAZADYNE) 12 MG tablet Take 12 mg by mouth 2 (two) times daily.      glimepiride (AMARYL) 2 MG tablet Take 2 mg by mouth 2 (two) times daily.      Glucosamine-Chondroitin 500-400 MG CAPS Take by mouth daily. Take 3 tabs     insulin  aspart (NOVOLOG FLEXPEN) 100 UNIT/ML FlexPen Inject 15 Units into the skin daily before supper.     insulin degludec (TRESIBA FLEXTOUCH) 100 UNIT/ML FlexTouch Pen Inject 40 Units into the skin at bedtime.     insulin detemir (LEVEMIR) 100 UNIT/ML FlexPen Inject 40 Units into the skin daily. (Patient not taking: Reported on 07/22/2021)     latanoprost (XALATAN) 0.005 % ophthalmic solution Place 1 drop into the left eye at bedtime.     melatonin 1 MG TABS tablet Take by mouth.     metFORMIN (GLUCOPHAGE-XR) 500 MG 24 hr tablet Take 1,000 mg by  mouth at bedtime.     Multiple Vitamin (MULTIVITAMIN) tablet Take 1 tablet by mouth daily.     NEOMYCIN-POLYMYXIN-HYDROCORTISONE (CORTISPORIN) 1 % SOLN OTIC solution Place 5 drops into both ears daily as needed. Ear wax build up     nitroGLYCERIN (NITROSTAT) 0.4 MG SL tablet Place 1 tablet (0.4 mg total) under the tongue every 5 (five) minutes as needed for chest pain. After 3rd nitro within 15 mins, seek emergency services. 25 tablet 3   Omega-3 Fatty Acids (FISH OIL PO) Take 1 capsule by mouth daily.     omeprazole (PRILOSEC) 20 MG capsule Take 20 mg by mouth daily.     oxyCODONE (OXY IR/ROXICODONE) 5 MG immediate release tablet Take 0.5-1 tablets (2.5-5 mg total) by mouth every 4 (four) hours as needed for moderate pain or severe pain (pain score 4-6). 30 tablet 0   polyethylene glycol (MIRALAX / GLYCOLAX) 17 g packet Take 17 g by mouth daily.  0   pramipexole (MIRAPEX) 0.125 MG tablet Take 0.125 mg by mouth at bedtime.     sertraline (ZOLOFT) 50 MG tablet Take 50 mg by mouth daily.     simvastatin (ZOCOR) 40 MG tablet Take 40 mg by mouth at bedtime.     traMADol (ULTRAM) 50 MG tablet Take 1 tablet (50 mg total) by mouth every 6 (six) hours as needed for moderate pain. 30 tablet 0   No current facility-administered medications for this visit.     Allergies:   Morphine and related, Chlorhexidine, and Hydrocodone-acetaminophen   Social History:  The patient  reports that he has never smoked. He has never used smokeless tobacco. He reports current alcohol use. He reports that he does not use drugs.   Family History:   family history includes Arthritis in his sister; Heart attack in his father; Stroke in his mother.    Review of Systems: Review of Systems  Constitutional: Negative.   Respiratory: Negative.    Cardiovascular: Negative.   Gastrointestinal: Negative.   Musculoskeletal: Negative.   Neurological: Negative.   Psychiatric/Behavioral:  Positive for memory loss.   All other  systems reviewed and are negative.    PHYSICAL EXAM: VS:  There were no vitals taken for this visit. , BMI There is no height or weight on file to calculate BMI. Constitutional:  oriented to person, place, and time. No distress.  HENT:  Head: Grossly normal Eyes:  no discharge. No scleral icterus.  Neck: No JVD, no carotid bruits  Cardiovascular: Regular rate and rhythm, no murmurs appreciated Pulmonary/Chest: Clear to auscultation bilaterally, no wheezes or rails Abdominal: Soft.  no distension.  no tenderness.  Musculoskeletal: Normal range of motion Neurological:  normal muscle tone. Coordination normal. No atrophy full exam not performed Skin: Skin warm and dry Psychiatric: normal affect, pleasant   Recent Labs: No results found for requested labs within last 365 days.  Lipid Panel Lab Results  Component Value Date   CHOL 88 06/07/2017   HDL 36 (L) 06/07/2017   LDLCALC 39 06/07/2017   TRIG 66 06/07/2017      Wt Readings from Last 3 Encounters:  07/22/21 185 lb (83.9 kg)  12/30/20 182 lb (82.6 kg)  01/27/20 176 lb 6 oz (80 kg)       ASSESSMENT AND PLAN:  Atherosclerosis of native coronary artery of native heart with stable angina pectoris (HCC) -  Currently with no symptoms of angina. No further workup at this time. Continue current medication regimen.  Type 2 diabetes mellitus with other circulatory complication, without long-term current use of insulin (HCC)  Managed by primary care and endocrinology A1c of 8.5 Dietary discretion recommended  PAD (peripheral artery disease) (HCC) - Plan: EKG 12-Lead Cholesterol at goal, Recommend he continue to work on his diabetes  Mixed hyperlipidemia -  Cholesterol is at goal on the current lipid regimen.  No changes to the medications were made.  Dementia Followed by neurology, some memory issues No longer driving Wife supportive, does most ADLs around the house    Total encounter time more than 25  minutes  Greater than 50% was spent in counseling and coordination of care with the patient   No orders of the defined types were placed in this encounter.    Signed, Dossie Arbour, M.D., Ph.D. 07/29/2022  [facility] [place], [place] [phone]

## 2022-08-01 ENCOUNTER — Ambulatory Visit: Payer: Medicare PPO | Admitting: Cardiovascular Disease

## 2022-08-01 DIAGNOSIS — E1159 Type 2 diabetes mellitus with other circulatory complications: Secondary | ICD-10-CM

## 2022-08-01 DIAGNOSIS — I2510 Atherosclerotic heart disease of native coronary artery without angina pectoris: Secondary | ICD-10-CM

## 2022-08-01 DIAGNOSIS — R531 Weakness: Secondary | ICD-10-CM

## 2022-08-01 DIAGNOSIS — E782 Mixed hyperlipidemia: Secondary | ICD-10-CM

## 2022-08-01 DIAGNOSIS — R42 Dizziness and giddiness: Secondary | ICD-10-CM

## 2022-08-01 DIAGNOSIS — R0609 Other forms of dyspnea: Secondary | ICD-10-CM

## 2022-08-01 DIAGNOSIS — I739 Peripheral vascular disease, unspecified: Secondary | ICD-10-CM

## 2023-09-10 ENCOUNTER — Other Ambulatory Visit (INDEPENDENT_AMBULATORY_CARE_PROVIDER_SITE_OTHER): Payer: Self-pay | Admitting: Podiatry

## 2023-09-10 DIAGNOSIS — R0989 Other specified symptoms and signs involving the circulatory and respiratory systems: Secondary | ICD-10-CM

## 2023-10-09 ENCOUNTER — Ambulatory Visit (INDEPENDENT_AMBULATORY_CARE_PROVIDER_SITE_OTHER): Payer: Medicare PPO

## 2023-10-09 DIAGNOSIS — R0989 Other specified symptoms and signs involving the circulatory and respiratory systems: Secondary | ICD-10-CM

## 2023-10-12 LAB — VAS US ABI WITH/WO TBI
Left ABI: 0.85
Right ABI: 1.06

## 2023-10-16 ENCOUNTER — Encounter (INDEPENDENT_AMBULATORY_CARE_PROVIDER_SITE_OTHER): Payer: Self-pay

## 2023-10-16 ENCOUNTER — Encounter (INDEPENDENT_AMBULATORY_CARE_PROVIDER_SITE_OTHER): Payer: Medicare PPO | Admitting: Nurse Practitioner

## 2024-01-22 ENCOUNTER — Encounter (INDEPENDENT_AMBULATORY_CARE_PROVIDER_SITE_OTHER): Payer: Self-pay
